# Patient Record
Sex: Female | Born: 1975 | Hispanic: Yes | Smoking: Never smoker
Health system: Southern US, Community
[De-identification: ages and names within clinical notes are randomized; demographics above are authoritative.]

## PROBLEM LIST (undated history)

## (undated) ENCOUNTER — Inpatient Hospital Stay (HOSPITAL_COMMUNITY): Payer: Self-pay

## (undated) DIAGNOSIS — F419 Anxiety disorder, unspecified: Secondary | ICD-10-CM

## (undated) DIAGNOSIS — O09529 Supervision of elderly multigravida, unspecified trimester: Secondary | ICD-10-CM

## (undated) DIAGNOSIS — D649 Anemia, unspecified: Secondary | ICD-10-CM

## (undated) DIAGNOSIS — I10 Essential (primary) hypertension: Secondary | ICD-10-CM

## (undated) DIAGNOSIS — E119 Type 2 diabetes mellitus without complications: Secondary | ICD-10-CM

## (undated) HISTORY — DX: Anemia, unspecified: D64.9

## (undated) HISTORY — DX: Anxiety disorder, unspecified: F41.9

## (undated) HISTORY — DX: Supervision of elderly multigravida, unspecified trimester: O09.529

## (undated) HISTORY — DX: Type 2 diabetes mellitus without complications: E11.9

---

## 1998-01-21 ENCOUNTER — Ambulatory Visit (HOSPITAL_COMMUNITY): Admission: RE | Admit: 1998-01-21 | Discharge: 1998-01-21 | Payer: Self-pay | Admitting: *Deleted

## 1998-03-27 ENCOUNTER — Observation Stay (HOSPITAL_COMMUNITY): Admission: EM | Admit: 1998-03-27 | Discharge: 1998-03-28 | Payer: Self-pay | Admitting: Emergency Medicine

## 1998-04-18 ENCOUNTER — Inpatient Hospital Stay (HOSPITAL_COMMUNITY): Admission: AD | Admit: 1998-04-18 | Discharge: 1998-04-18 | Payer: Self-pay | Admitting: *Deleted

## 1998-04-19 ENCOUNTER — Inpatient Hospital Stay (HOSPITAL_COMMUNITY): Admission: AD | Admit: 1998-04-19 | Discharge: 1998-04-19 | Payer: Self-pay | Admitting: *Deleted

## 1998-05-15 ENCOUNTER — Ambulatory Visit (HOSPITAL_COMMUNITY): Admission: RE | Admit: 1998-05-15 | Discharge: 1998-05-15 | Payer: Self-pay | Admitting: Obstetrics

## 1998-05-17 ENCOUNTER — Inpatient Hospital Stay (HOSPITAL_COMMUNITY): Admission: AD | Admit: 1998-05-17 | Discharge: 1998-05-17 | Payer: Self-pay | Admitting: *Deleted

## 1998-06-11 ENCOUNTER — Inpatient Hospital Stay (HOSPITAL_COMMUNITY): Admission: AD | Admit: 1998-06-11 | Discharge: 1998-06-11 | Payer: Self-pay | Admitting: Obstetrics

## 1998-06-12 ENCOUNTER — Inpatient Hospital Stay (HOSPITAL_COMMUNITY): Admission: RE | Admit: 1998-06-12 | Discharge: 1998-06-15 | Payer: Self-pay | Admitting: Obstetrics & Gynecology

## 1999-12-26 ENCOUNTER — Inpatient Hospital Stay (HOSPITAL_COMMUNITY): Admission: AD | Admit: 1999-12-26 | Discharge: 1999-12-26 | Payer: Self-pay | Admitting: *Deleted

## 2000-01-27 ENCOUNTER — Emergency Department (HOSPITAL_COMMUNITY): Admission: EM | Admit: 2000-01-27 | Discharge: 2000-01-28 | Payer: Self-pay | Admitting: Emergency Medicine

## 2000-01-27 ENCOUNTER — Inpatient Hospital Stay (HOSPITAL_COMMUNITY): Admission: AD | Admit: 2000-01-27 | Discharge: 2000-01-27 | Payer: Self-pay | Admitting: Obstetrics

## 2000-02-13 ENCOUNTER — Ambulatory Visit (HOSPITAL_COMMUNITY): Admission: RE | Admit: 2000-02-13 | Discharge: 2000-02-13 | Payer: Self-pay | Admitting: *Deleted

## 2000-02-13 ENCOUNTER — Inpatient Hospital Stay (HOSPITAL_COMMUNITY): Admission: AD | Admit: 2000-02-13 | Discharge: 2000-02-13 | Payer: Self-pay | Admitting: Obstetrics

## 2000-02-13 ENCOUNTER — Encounter: Payer: Self-pay | Admitting: *Deleted

## 2000-02-18 ENCOUNTER — Ambulatory Visit (HOSPITAL_COMMUNITY): Admission: RE | Admit: 2000-02-18 | Discharge: 2000-02-18 | Payer: Self-pay | Admitting: *Deleted

## 2000-02-18 ENCOUNTER — Encounter: Payer: Self-pay | Admitting: *Deleted

## 2000-08-26 ENCOUNTER — Ambulatory Visit (HOSPITAL_COMMUNITY): Admission: RE | Admit: 2000-08-26 | Discharge: 2000-08-26 | Payer: Self-pay | Admitting: *Deleted

## 2000-10-09 ENCOUNTER — Inpatient Hospital Stay (HOSPITAL_COMMUNITY): Admission: AD | Admit: 2000-10-09 | Discharge: 2000-10-09 | Payer: Self-pay | Admitting: *Deleted

## 2000-12-28 ENCOUNTER — Encounter: Payer: Self-pay | Admitting: Obstetrics

## 2000-12-28 ENCOUNTER — Inpatient Hospital Stay (HOSPITAL_COMMUNITY): Admission: AD | Admit: 2000-12-28 | Discharge: 2000-12-28 | Payer: Self-pay | Admitting: Obstetrics

## 2001-01-27 ENCOUNTER — Encounter (HOSPITAL_COMMUNITY): Admission: RE | Admit: 2001-01-27 | Discharge: 2001-02-03 | Payer: Self-pay | Admitting: *Deleted

## 2001-01-31 ENCOUNTER — Inpatient Hospital Stay (HOSPITAL_COMMUNITY): Admission: AD | Admit: 2001-01-31 | Discharge: 2001-01-31 | Payer: Self-pay | Admitting: *Deleted

## 2001-02-04 ENCOUNTER — Inpatient Hospital Stay (HOSPITAL_COMMUNITY): Admission: AD | Admit: 2001-02-04 | Discharge: 2001-02-05 | Payer: Self-pay | Admitting: *Deleted

## 2001-10-02 ENCOUNTER — Encounter: Payer: Self-pay | Admitting: Obstetrics

## 2001-10-02 ENCOUNTER — Inpatient Hospital Stay (HOSPITAL_COMMUNITY): Admission: AD | Admit: 2001-10-02 | Discharge: 2001-10-02 | Payer: Self-pay | Admitting: Obstetrics & Gynecology

## 2002-07-13 ENCOUNTER — Inpatient Hospital Stay (HOSPITAL_COMMUNITY): Admission: AD | Admit: 2002-07-13 | Discharge: 2002-07-13 | Payer: Self-pay | Admitting: *Deleted

## 2002-07-16 ENCOUNTER — Inpatient Hospital Stay (HOSPITAL_COMMUNITY): Admission: AD | Admit: 2002-07-16 | Discharge: 2002-07-16 | Payer: Self-pay | Admitting: Obstetrics and Gynecology

## 2002-07-16 ENCOUNTER — Encounter: Payer: Self-pay | Admitting: Obstetrics and Gynecology

## 2003-03-26 ENCOUNTER — Encounter: Admission: RE | Admit: 2003-03-26 | Discharge: 2003-03-26 | Payer: Self-pay | Admitting: Family Medicine

## 2003-04-02 ENCOUNTER — Encounter (INDEPENDENT_AMBULATORY_CARE_PROVIDER_SITE_OTHER): Payer: Self-pay | Admitting: *Deleted

## 2003-04-02 ENCOUNTER — Encounter: Admission: RE | Admit: 2003-04-02 | Discharge: 2003-04-02 | Payer: Self-pay | Admitting: Sports Medicine

## 2003-04-02 LAB — CONVERTED CEMR LAB

## 2003-04-05 ENCOUNTER — Ambulatory Visit (HOSPITAL_COMMUNITY): Admission: RE | Admit: 2003-04-05 | Discharge: 2003-04-05 | Payer: Self-pay | Admitting: Family Medicine

## 2003-04-15 ENCOUNTER — Inpatient Hospital Stay (HOSPITAL_COMMUNITY): Admission: AD | Admit: 2003-04-15 | Discharge: 2003-04-15 | Payer: Self-pay | Admitting: Family Medicine

## 2003-04-19 ENCOUNTER — Encounter: Admission: RE | Admit: 2003-04-19 | Discharge: 2003-04-19 | Payer: Self-pay | Admitting: Family Medicine

## 2003-05-22 ENCOUNTER — Encounter: Admission: RE | Admit: 2003-05-22 | Discharge: 2003-05-22 | Payer: Self-pay | Admitting: Family Medicine

## 2003-06-24 ENCOUNTER — Encounter: Admission: RE | Admit: 2003-06-24 | Discharge: 2003-06-24 | Payer: Self-pay | Admitting: Sports Medicine

## 2003-06-27 ENCOUNTER — Encounter: Admission: RE | Admit: 2003-06-27 | Discharge: 2003-06-27 | Payer: Self-pay | Admitting: Family Medicine

## 2003-07-11 ENCOUNTER — Encounter: Admission: RE | Admit: 2003-07-11 | Discharge: 2003-07-11 | Payer: Self-pay | Admitting: Sports Medicine

## 2003-08-06 ENCOUNTER — Encounter: Payer: Self-pay | Admitting: *Deleted

## 2003-08-06 ENCOUNTER — Inpatient Hospital Stay (HOSPITAL_COMMUNITY): Admission: AD | Admit: 2003-08-06 | Discharge: 2003-08-06 | Payer: Self-pay | Admitting: *Deleted

## 2003-08-29 ENCOUNTER — Inpatient Hospital Stay (HOSPITAL_COMMUNITY): Admission: AD | Admit: 2003-08-29 | Discharge: 2003-08-29 | Payer: Self-pay | Admitting: Obstetrics & Gynecology

## 2003-08-30 ENCOUNTER — Inpatient Hospital Stay (HOSPITAL_COMMUNITY): Admission: AD | Admit: 2003-08-30 | Discharge: 2003-08-31 | Payer: Self-pay | Admitting: Obstetrics & Gynecology

## 2006-12-30 ENCOUNTER — Encounter (INDEPENDENT_AMBULATORY_CARE_PROVIDER_SITE_OTHER): Payer: Self-pay | Admitting: *Deleted

## 2007-07-24 ENCOUNTER — Emergency Department (HOSPITAL_COMMUNITY): Admission: EM | Admit: 2007-07-24 | Discharge: 2007-07-24 | Payer: Self-pay | Admitting: Emergency Medicine

## 2007-08-23 ENCOUNTER — Inpatient Hospital Stay (HOSPITAL_COMMUNITY): Admission: AD | Admit: 2007-08-23 | Discharge: 2007-08-23 | Payer: Self-pay | Admitting: Family Medicine

## 2007-10-05 ENCOUNTER — Ambulatory Visit (HOSPITAL_COMMUNITY): Admission: RE | Admit: 2007-10-05 | Discharge: 2007-10-05 | Payer: Self-pay | Admitting: Family Medicine

## 2008-10-03 ENCOUNTER — Inpatient Hospital Stay (HOSPITAL_COMMUNITY): Admission: AD | Admit: 2008-10-03 | Discharge: 2008-10-03 | Payer: Self-pay | Admitting: Obstetrics & Gynecology

## 2010-02-11 ENCOUNTER — Inpatient Hospital Stay (HOSPITAL_COMMUNITY): Admission: AD | Admit: 2010-02-11 | Discharge: 2010-02-11 | Payer: Self-pay | Admitting: Obstetrics & Gynecology

## 2010-02-17 ENCOUNTER — Inpatient Hospital Stay (HOSPITAL_COMMUNITY): Admission: AD | Admit: 2010-02-17 | Discharge: 2010-02-18 | Payer: Self-pay | Admitting: Obstetrics & Gynecology

## 2010-02-26 ENCOUNTER — Ambulatory Visit (HOSPITAL_COMMUNITY): Admission: RE | Admit: 2010-02-26 | Discharge: 2010-02-26 | Payer: Self-pay | Admitting: Obstetrics & Gynecology

## 2010-03-19 ENCOUNTER — Ambulatory Visit (HOSPITAL_COMMUNITY): Admission: RE | Admit: 2010-03-19 | Discharge: 2010-03-19 | Payer: Self-pay | Admitting: Obstetrics & Gynecology

## 2010-04-02 ENCOUNTER — Encounter: Payer: Self-pay | Admitting: Obstetrics & Gynecology

## 2010-04-02 ENCOUNTER — Ambulatory Visit (HOSPITAL_COMMUNITY): Admission: RE | Admit: 2010-04-02 | Discharge: 2010-04-02 | Payer: Self-pay | Admitting: Obstetrics & Gynecology

## 2010-04-02 ENCOUNTER — Encounter (INDEPENDENT_AMBULATORY_CARE_PROVIDER_SITE_OTHER): Payer: Self-pay | Admitting: Family Medicine

## 2010-04-02 ENCOUNTER — Ambulatory Visit: Payer: Self-pay | Admitting: Obstetrics & Gynecology

## 2010-04-02 LAB — CONVERTED CEMR LAB
Basophils Relative: 0 % (ref 0–1)
Eosinophils Absolute: 0.1 10*3/uL (ref 0.0–0.7)
Eosinophils Relative: 2 % (ref 0–5)
HCT: 33.1 % — ABNORMAL LOW (ref 36.0–46.0)
Hgb F Quant: 0 % (ref 0.0–2.0)
Hgb S Quant: 0 % (ref 0.0–0.0)
Lymphs Abs: 1.5 10*3/uL (ref 0.7–4.0)
MCHC: 32.3 g/dL (ref 30.0–36.0)
MCV: 81.5 fL (ref 78.0–100.0)
Monocytes Absolute: 0.3 10*3/uL (ref 0.1–1.0)
Monocytes Relative: 6 % (ref 3–12)
RBC: 4.06 M/uL (ref 3.87–5.11)
Rh Type: POSITIVE
Rubella: 66.9 intl units/mL — ABNORMAL HIGH
WBC: 5.1 10*3/uL (ref 4.0–10.5)

## 2010-04-30 ENCOUNTER — Ambulatory Visit: Payer: Self-pay | Admitting: Obstetrics & Gynecology

## 2010-04-30 ENCOUNTER — Ambulatory Visit (HOSPITAL_COMMUNITY): Admission: RE | Admit: 2010-04-30 | Discharge: 2010-04-30 | Payer: Self-pay | Admitting: Obstetrics & Gynecology

## 2010-05-15 ENCOUNTER — Ambulatory Visit (HOSPITAL_COMMUNITY): Admission: RE | Admit: 2010-05-15 | Discharge: 2010-05-15 | Payer: Self-pay | Admitting: Obstetrics & Gynecology

## 2010-05-28 ENCOUNTER — Ambulatory Visit: Payer: Self-pay | Admitting: Obstetrics and Gynecology

## 2010-07-02 ENCOUNTER — Encounter: Payer: Self-pay | Admitting: Obstetrics & Gynecology

## 2010-07-02 ENCOUNTER — Ambulatory Visit: Payer: Self-pay | Admitting: Obstetrics & Gynecology

## 2010-07-02 LAB — CONVERTED CEMR LAB: Pap Smear: NEGATIVE

## 2010-07-16 ENCOUNTER — Encounter: Payer: Self-pay | Admitting: Obstetrics and Gynecology

## 2010-07-16 ENCOUNTER — Ambulatory Visit: Payer: Self-pay | Admitting: Family Medicine

## 2010-07-16 LAB — CONVERTED CEMR LAB
HCT: 33.3 % — ABNORMAL LOW (ref 36.0–46.0)
Hemoglobin: 10.7 g/dL — ABNORMAL LOW (ref 12.0–15.0)
Platelets: 241 10*3/uL (ref 150–400)

## 2010-07-30 ENCOUNTER — Ambulatory Visit: Payer: Self-pay | Admitting: Obstetrics and Gynecology

## 2010-07-30 ENCOUNTER — Encounter: Payer: Self-pay | Admitting: Family Medicine

## 2010-07-31 ENCOUNTER — Encounter: Payer: Self-pay | Admitting: Obstetrics & Gynecology

## 2010-08-20 ENCOUNTER — Encounter (INDEPENDENT_AMBULATORY_CARE_PROVIDER_SITE_OTHER): Payer: Self-pay | Admitting: *Deleted

## 2010-08-20 ENCOUNTER — Ambulatory Visit: Payer: Self-pay | Admitting: Obstetrics & Gynecology

## 2010-09-03 ENCOUNTER — Ambulatory Visit: Payer: Self-pay | Admitting: Family Medicine

## 2010-09-16 ENCOUNTER — Encounter: Payer: Self-pay | Admitting: Obstetrics and Gynecology

## 2010-09-16 ENCOUNTER — Ambulatory Visit: Payer: Self-pay | Admitting: Obstetrics and Gynecology

## 2010-09-17 ENCOUNTER — Encounter: Payer: Self-pay | Admitting: Obstetrics and Gynecology

## 2010-09-30 ENCOUNTER — Ambulatory Visit: Payer: Self-pay | Admitting: Obstetrics and Gynecology

## 2010-10-07 ENCOUNTER — Ambulatory Visit: Payer: Self-pay | Admitting: Obstetrics and Gynecology

## 2010-10-07 ENCOUNTER — Encounter: Payer: Self-pay | Admitting: Obstetrics and Gynecology

## 2010-10-14 ENCOUNTER — Ambulatory Visit: Payer: Self-pay | Admitting: Obstetrics and Gynecology

## 2010-10-14 ENCOUNTER — Inpatient Hospital Stay (HOSPITAL_COMMUNITY)
Admission: AD | Admit: 2010-10-14 | Discharge: 2010-10-16 | Payer: Self-pay | Source: Home / Self Care | Attending: Obstetrics & Gynecology | Admitting: Obstetrics & Gynecology

## 2010-10-14 ENCOUNTER — Encounter: Payer: Self-pay | Admitting: Obstetrics and Gynecology

## 2010-10-14 DIAGNOSIS — O139 Gestational [pregnancy-induced] hypertension without significant proteinuria, unspecified trimester: Secondary | ICD-10-CM

## 2010-10-21 ENCOUNTER — Ambulatory Visit: Payer: Self-pay | Admitting: Obstetrics and Gynecology

## 2010-11-27 ENCOUNTER — Ambulatory Visit: Admit: 2010-11-27 | Payer: Self-pay | Admitting: Obstetrics and Gynecology

## 2011-01-12 LAB — POCT URINALYSIS DIPSTICK
Bilirubin Urine: NEGATIVE
Bilirubin Urine: NEGATIVE
Glucose, UA: NEGATIVE mg/dL
Ketones, ur: NEGATIVE mg/dL
Nitrite: NEGATIVE
Nitrite: NEGATIVE
Protein, ur: NEGATIVE mg/dL
Protein, ur: NEGATIVE mg/dL
Specific Gravity, Urine: 1.02 (ref 1.005–1.030)
Urobilinogen, UA: 0.2 mg/dL (ref 0.0–1.0)
pH: 6.5 (ref 5.0–8.0)
pH: 7 (ref 5.0–8.0)

## 2011-01-12 LAB — PROTEIN / CREATININE RATIO, URINE
Creatinine, Urine: 74.8 mg/dL
Protein Creatinine Ratio: 0.19 — ABNORMAL HIGH (ref 0.00–0.15)
Total Protein, Urine: 14 mg/dL

## 2011-01-12 LAB — COMPREHENSIVE METABOLIC PANEL
CO2: 22 mEq/L (ref 19–32)
Calcium: 8.7 mg/dL (ref 8.4–10.5)
Creatinine, Ser: 0.58 mg/dL (ref 0.4–1.2)
GFR calc Af Amer: >60 mL/min (ref >60)
GFR calc non Af Amer: >60 mL/min (ref >60)
Glucose, Bld: 82 mg/dL (ref 70–99)

## 2011-01-12 LAB — CBC
HCT: 30.5 % — ABNORMAL LOW (ref 36.0–46.0)
Hemoglobin: 10.2 g/dL — ABNORMAL LOW (ref 12.0–15.0)
MCH: 26.5 pg (ref 26.0–34.0)
MCHC: 33.5 g/dL (ref 30.0–36.0)
Platelets: 162 10*3/uL (ref 150–400)
RDW: 14.7 % (ref 11.5–15.5)
WBC: 4.8 10*3/uL (ref 4.0–10.5)

## 2011-01-13 LAB — POCT URINALYSIS DIPSTICK
Bilirubin Urine: NEGATIVE
Bilirubin Urine: NEGATIVE
Bilirubin Urine: NEGATIVE
Bilirubin Urine: NEGATIVE
Glucose, UA: NEGATIVE mg/dL
Glucose, UA: NEGATIVE mg/dL
Glucose, UA: NEGATIVE mg/dL
Glucose, UA: NEGATIVE mg/dL
Ketones, ur: NEGATIVE mg/dL
Ketones, ur: NEGATIVE mg/dL
Ketones, ur: NEGATIVE mg/dL
Ketones, ur: NEGATIVE mg/dL
Nitrite: NEGATIVE
Nitrite: NEGATIVE
Protein, ur: NEGATIVE mg/dL
Specific Gravity, Urine: 1.025 (ref 1.005–1.030)
Specific Gravity, Urine: 1.025 (ref 1.005–1.030)
Urobilinogen, UA: 0.2 mg/dL (ref 0.0–1.0)
pH: 5.5 (ref 5.0–8.0)
pH: 5.5 (ref 5.0–8.0)

## 2011-01-14 LAB — POCT URINALYSIS DIPSTICK
Bilirubin Urine: NEGATIVE
Bilirubin Urine: NEGATIVE
Bilirubin Urine: NEGATIVE
Glucose, UA: NEGATIVE mg/dL
Glucose, UA: NEGATIVE mg/dL
Glucose, UA: NEGATIVE mg/dL
Ketones, ur: NEGATIVE mg/dL
Ketones, ur: NEGATIVE mg/dL
Nitrite: NEGATIVE
Nitrite: POSITIVE — AB
Protein, ur: NEGATIVE mg/dL
Protein, ur: NEGATIVE mg/dL
Specific Gravity, Urine: 1.02 (ref 1.005–1.030)
Specific Gravity, Urine: 1.025 (ref 1.005–1.030)
Specific Gravity, Urine: 1.025 (ref 1.005–1.030)
Urobilinogen, UA: 0.2 mg/dL (ref 0.0–1.0)
Urobilinogen, UA: 0.2 mg/dL (ref 0.0–1.0)
pH: 5.5 (ref 5.0–8.0)
pH: 6.5 (ref 5.0–8.0)
pH: 5.5 (ref 5.0–8.0)

## 2011-01-16 LAB — POCT URINALYSIS DIP (DEVICE)
Bilirubin Urine: NEGATIVE
Ketones, ur: NEGATIVE mg/dL
Protein, ur: NEGATIVE mg/dL
pH: 5.5 (ref 5.0–8.0)

## 2011-01-18 LAB — POCT URINALYSIS DIP (DEVICE)
Ketones, ur: NEGATIVE mg/dL
Protein, ur: NEGATIVE mg/dL
Protein, ur: NEGATIVE mg/dL
Specific Gravity, Urine: 1.025 (ref 1.005–1.030)
Specific Gravity, Urine: >=1.03 (ref 1.005–1.030)
Urobilinogen, UA: 0.2 mg/dL (ref 0.0–1.0)
pH: 5.5 (ref 5.0–8.0)
pH: 6.5 (ref 5.0–8.0)

## 2011-01-19 LAB — URINALYSIS, ROUTINE W REFLEX MICROSCOPIC
Bilirubin Urine: NEGATIVE
Leukocytes, UA: NEGATIVE
Nitrite: NEGATIVE
Specific Gravity, Urine: >1.03 — ABNORMAL HIGH (ref 1.005–1.030)
Urobilinogen, UA: 0.2 mg/dL (ref 0.0–1.0)

## 2011-01-19 LAB — URINE MICROSCOPIC-ADD ON

## 2011-01-20 LAB — CBC
Hemoglobin: 11.6 g/dL — ABNORMAL LOW (ref 12.0–15.0)
MCHC: 33.5 g/dL (ref 30.0–36.0)
MCV: 82.1 fL (ref 78.0–100.0)
RBC: 4.23 MIL/uL (ref 3.87–5.11)

## 2011-01-20 LAB — URINE MICROSCOPIC-ADD ON

## 2011-01-20 LAB — URINALYSIS, ROUTINE W REFLEX MICROSCOPIC
Glucose, UA: NEGATIVE mg/dL
Ketones, ur: 15 mg/dL — AB
Leukocytes, UA: NEGATIVE
pH: 6.5 (ref 5.0–8.0)

## 2011-01-20 LAB — URINE CULTURE: Colony Count: >=100000

## 2011-01-20 LAB — GC/CHLAMYDIA PROBE AMP, GENITAL: GC Probe Amp, Genital: NEGATIVE

## 2011-08-06 LAB — HCG, SERUM, QUALITATIVE: Preg, Serum: NEGATIVE

## 2011-08-06 LAB — WET PREP, GENITAL: Yeast Wet Prep HPF POC: NONE SEEN

## 2011-08-06 LAB — CBC
Platelets: 291 10*3/uL (ref 150–400)
WBC: 6.7 10*3/uL (ref 4.0–10.5)

## 2011-08-06 LAB — GC/CHLAMYDIA PROBE AMP, GENITAL: Chlamydia, DNA Probe: NEGATIVE

## 2011-08-11 LAB — POCT PREGNANCY, URINE
Operator id: 239321
Preg Test, Ur: NEGATIVE

## 2011-08-11 LAB — DIFFERENTIAL
Eosinophils Absolute: 0.1
Eosinophils Relative: 1
Lymphs Abs: 1.8
Monocytes Absolute: 0.4
Monocytes Relative: 6

## 2011-08-11 LAB — CBC
HCT: 38.1
Hemoglobin: 13.1
MCV: 80.1
Platelets: 306
RBC: 4.76
WBC: 6.7

## 2011-08-11 LAB — URINALYSIS, ROUTINE W REFLEX MICROSCOPIC
Bilirubin Urine: NEGATIVE
Glucose, UA: NEGATIVE
Ketones, ur: NEGATIVE
Leukocytes, UA: NEGATIVE
pH: 6.5

## 2011-08-11 LAB — WET PREP, GENITAL: Yeast Wet Prep HPF POC: NONE SEEN

## 2011-08-12 LAB — I-STAT 8, (EC8 V) (CONVERTED LAB)
Acid-Base Excess: 1
Chloride: 106
HCT: 46
Hemoglobin: 15.6 — ABNORMAL HIGH
Operator id: 151321
Potassium: 4.1
Sodium: 138
TCO2: 28

## 2011-08-12 LAB — POCT CARDIAC MARKERS: Troponin i, poc: 0.11 — ABNORMAL HIGH

## 2011-08-12 LAB — POCT I-STAT CREATININE: Operator id: 151321

## 2012-08-11 ENCOUNTER — Emergency Department (HOSPITAL_COMMUNITY): Payer: Self-pay

## 2012-08-11 ENCOUNTER — Emergency Department (HOSPITAL_COMMUNITY)
Admission: EM | Admit: 2012-08-11 | Discharge: 2012-08-11 | Disposition: A | Discharge status: Home / Self Care | Payer: Self-pay | Attending: Emergency Medicine | Admitting: Emergency Medicine

## 2012-08-11 ENCOUNTER — Encounter (HOSPITAL_COMMUNITY): Payer: Self-pay | Admitting: *Deleted

## 2012-08-11 DIAGNOSIS — S79919A Unspecified injury of unspecified hip, initial encounter: Secondary | ICD-10-CM | POA: Insufficient documentation

## 2012-08-11 DIAGNOSIS — W19XXXA Unspecified fall, initial encounter: Secondary | ICD-10-CM | POA: Insufficient documentation

## 2012-08-11 DIAGNOSIS — T148XXA Other injury of unspecified body region, initial encounter: Secondary | ICD-10-CM

## 2012-08-11 MED ORDER — ACETAMINOPHEN 325 MG PO TABS
650.0000 mg | ORAL_TABLET | Freq: Once | ORAL | Status: AC
  Administered 2012-08-11: 650 mg via ORAL
  Filled 2012-08-11: qty 2

## 2012-08-11 MED ORDER — HYDROCODONE-ACETAMINOPHEN 5-325 MG PO TABS: 1.0000 | ORAL_TABLET | ORAL | Status: DC | PRN

## 2012-08-11 NOTE — ED Provider Notes (Signed)
Kathleen Tucker S 8:00 PM patient discussed in sign out with Dr. Effie Shy. Patient with a fall and minor trauma. X-rays of lumbar and sacral spine pending. We'll disposition patient based on x-ray findings.    Extremities without signs of fractures or other concerning injury. At this time will have patient return home with conservative treatment.  Angus Seller, [place] 08/11/12 2103

## 2012-08-11 NOTE — ED Provider Notes (Signed)
History   This chart was scribed for No att. providers found by Toya Smothers. The patient was seen in room TR08C/TR08C. Patient's care was started at 1755.  CSN: [id number]  Arrival date & time 08/11/12  1755   First MD Initiated Contact with Patient 08/11/12 1934      Chief Complaint  Patient presents with  . Fall   The history is provided by the patient. No language interpreter was used.   Kathleen Tucker is a 36 y.o. female who presents to the Emergency Department complaining of 1 day of sudden onset severe constant lower back pain with associate bruising as the result of a 2 foot fall. Pain is aggravated with ambulation and palpation, while alleviated by nothing. Pt denotes landing on her coccyx area falling down 4 stairs onto a hard wood surface. She was ambulatory after fall, not entrapped, and without cephalic trauma. Prior to arrival pain has been treated with Tylenol providing mild relief. Pt denies cephalic pain, neck pain, chest pain, chest tightness, SOB, nausea, and emesis.   History reviewed. No pertinent past medical history.  History reviewed. No pertinent past surgical history.  No family history on file.  History  Substance Use Topics  . Smoking status: Never Smoker   . Smokeless tobacco: Not on file  . Alcohol Use: No   Review of Systems  Constitutional: Negative for fatigue.  HENT: Negative for congestion, sinus pressure and ear discharge.   Eyes: Negative for discharge.  Respiratory: Negative for cough.   Cardiovascular: Negative for chest pain.  Gastrointestinal: Negative for diarrhea, blood in stool and anal bleeding.  Genitourinary: Negative for frequency.  Musculoskeletal: Positive for back pain.  Skin: Negative for rash.  Neurological: Negative for seizures.  Hematological: Negative.   Psychiatric/Behavioral: Negative for hallucinations.  All other systems reviewed and are negative.    Allergies  Review of patient's allergies indicates no  known allergies.  Home Medications   Current Outpatient Rx  Name  Route  Sig  Dispense  Refill  . ACETAMINOPHEN 500 MG PO TABS   Oral   Take 500 mg by mouth every 6 (six) hours as needed. For pain         . HYDROCODONE-ACETAMINOPHEN 5-325 MG PO TABS   Oral   Take 1 tablet by mouth every 4 (four) hours as needed for pain.   10 tablet   0     BP 130/70  Pulse 74  Temp 98.3 F (36.8 C) (Oral)  Resp 18  SpO2 98%  LMP 08/10/2012  Physical Exam  Nursing note and vitals reviewed. Constitutional: She is oriented to person, place, and time. She appears well-developed and well-nourished. No distress.        She ambulates with right antalgic gate.  HENT:  Head: Normocephalic and atraumatic.  Eyes: Conjunctivae normal and EOM are normal.  Neck: Neck supple. No tracheal deviation present.  Cardiovascular: Normal rate.   Pulmonary/Chest: Effort normal. No respiratory distress.  Abdominal: She exhibits no distension.  Musculoskeletal: Normal range of motion.       Diffuse thoracic and lumbar tenderness, mild. Right buttocks hematoma 15 x 10 cm.  Neurological: She is alert and oriented to person, place, and time. No sensory deficit.  Skin: Skin is dry.  Psychiatric: She has a normal mood and affect. Her behavior is normal.    ED Course  Procedures  DIAGNOSTIC STUDIES: Oxygen Saturation is 98% on room air, normal by my interpretation.    COORDINATION OF CARE: 19:39-  Evaluated Pt. Pt is awake, alert, and oriented. 19:42- Patientinformed of clinical course, understand medical decision-making process, and agree with plan. Plan: Home Medications- Percocet; Home Treatments- cold compress;  19:45- Ordered DG Lumbar Spine Complete 1 time imaging and DG Pelvis 1-2 Views 1 time imaging.       1. Fall   2. Contusion       MDM  Apparent mechanical fall without severe injury. Pt is stable for d/c.    I personally performed the services described in this documentation, which  was scribed in my presence. The recorded information has been reviewed and considered.    Flint Melter, MD 09/04/12 1204

## 2012-08-11 NOTE — ED Provider Notes (Signed)
Medical screening examination/treatment/procedure(s) were performed by non-physician practitioner and as supervising physician I was immediately available for consultation/collaboration.   Jacklin Zwick, MD 08/11/12 2355 

## 2012-08-11 NOTE — ED Notes (Signed)
C/o pain in her rt hip where she fell yesterday

## 2012-10-16 ENCOUNTER — Encounter (HOSPITAL_COMMUNITY): Payer: Self-pay | Admitting: *Deleted

## 2012-10-16 ENCOUNTER — Emergency Department (HOSPITAL_COMMUNITY)
Admission: EM | Admit: 2012-10-16 | Discharge: 2012-10-16 | Disposition: A | Discharge status: Home / Self Care | Payer: Self-pay | Attending: Emergency Medicine | Admitting: Emergency Medicine

## 2012-10-16 DIAGNOSIS — IMO0002 Reserved for concepts with insufficient information to code with codable children: Secondary | ICD-10-CM | POA: Insufficient documentation

## 2012-10-16 DIAGNOSIS — Y929 Unspecified place or not applicable: Secondary | ICD-10-CM | POA: Insufficient documentation

## 2012-10-16 DIAGNOSIS — T169XXA Foreign body in ear, unspecified ear, initial encounter: Secondary | ICD-10-CM | POA: Insufficient documentation

## 2012-10-16 DIAGNOSIS — R51 Headache: Secondary | ICD-10-CM | POA: Insufficient documentation

## 2012-10-16 DIAGNOSIS — Z791 Long term (current) use of non-steroidal anti-inflammatories (NSAID): Secondary | ICD-10-CM | POA: Insufficient documentation

## 2012-10-16 DIAGNOSIS — Y939 Activity, unspecified: Secondary | ICD-10-CM | POA: Insufficient documentation

## 2012-10-16 MED ORDER — IBUPROFEN 600 MG PO TABS: 600.0000 mg | ORAL_TABLET | Freq: Four times a day (QID) | ORAL | Status: DC | PRN

## 2012-10-16 NOTE — ED Notes (Signed)
Pt complains of left ear irritation after a bug flew in her ear yesterday.

## 2012-10-16 NOTE — ED Notes (Signed)
Pt is here with left ear pain since yesterday

## 2012-10-16 NOTE — ED Provider Notes (Signed)
Medical screening examination/treatment/procedure(s) were performed by non-physician practitioner and as supervising physician I was immediately available for consultation/collaboration.  Glynn Octave, MD 10/16/12 [phone]

## 2012-10-16 NOTE — ED Provider Notes (Signed)
History     CSN: [id number]  Arrival date & time 10/16/12  1240   First MD Initiated Contact with Patient 10/16/12 1513      Chief Complaint  Patient presents with  . Otalgia    (Consider location/radiation/quality/duration/timing/severity/associated sxs/prior treatment) Patient is a 36 y.o. female presenting with ear pain. The history is provided by the patient and a relative.  Otalgia This is a new problem. The current episode started 12 to 24 hours ago. There is pain in the left (patient states that an insect crawled into her ear yesterday.) ear. There has been no fever. The pain is at a severity of 6/10. The pain is moderate. Associated symptoms include headaches. Pertinent negatives include no ear discharge, no rhinorrhea, no neck pain and no cough.    History reviewed. No pertinent past medical history.  History reviewed. No pertinent past surgical history.  No family history on file.  History  Substance Use Topics  . Smoking status: Never Smoker   . Smokeless tobacco: Not on file  . Alcohol Use: No    OB History    Grav Para Term Preterm Abortions TAB SAB Ect Mult Living                  Review of Systems  HENT: Positive for ear pain. Negative for rhinorrhea, neck pain and ear discharge.   Respiratory: Negative for cough.   Neurological: Positive for headaches.    Allergies  Review of patient's allergies indicates no known allergies.  Home Medications   Current Outpatient Rx  Name  Route  Sig  Dispense  Refill  . ACETAMINOPHEN 500 MG PO TABS   Oral   Take 500 mg by mouth every 6 (six) hours as needed. For pain         . HYDROCODONE-ACETAMINOPHEN 5-325 MG PO TABS   Oral   Take 1 tablet by mouth every 4 (four) hours as needed. For pain           BP 127/65  Pulse 69  Temp 97.7 F (36.5 C) (Oral)  Resp 18  SpO2 97%  Physical Exam  HENT:  Left Ear: A foreign body is present.  Mouth/Throat: No posterior oropharyngeal edema or posterior  oropharyngeal erythema.    ED Course  FOREIGN BODY REMOVAL Date/Time: 10/16/2012 4:16 PM Performed by: Chauncey Reading Authorized by: Chauncey Reading Consent: Verbal consent obtained. Consent given by: patient Patient identity confirmed: verbally with patient Body area: ear Location details: left ear Patient sedated: no Patient restrained: no Patient cooperative: yes Localization method: visualized Removal mechanism: alligator forceps 0 objects recovered. Post-procedure assessment: foreign body not removed Comments: Several attempts without success of foreign body removal.  Will refer to ENT.   (including critical care time) Saline lavage also unsuccessful.  Labs Reviewed - No data to display No results found.   No diagnosis found. 1.  Foreign body in left ear canal    MDM  After several attempts, no success in foreign body removal in left ear. Referred to ENT.        Maela Takeda, PA-C 10/16/12 1625

## 2013-08-22 ENCOUNTER — Emergency Department (HOSPITAL_COMMUNITY)
Admission: EM | Admit: 2013-08-22 | Discharge: 2013-08-22 | Disposition: A | Discharge status: Home / Self Care | Payer: Self-pay | Attending: Emergency Medicine | Admitting: Emergency Medicine

## 2013-08-22 ENCOUNTER — Encounter (HOSPITAL_COMMUNITY): Payer: Self-pay | Admitting: Emergency Medicine

## 2013-08-22 DIAGNOSIS — J029 Acute pharyngitis, unspecified: Secondary | ICD-10-CM | POA: Insufficient documentation

## 2013-08-22 DIAGNOSIS — R509 Fever, unspecified: Secondary | ICD-10-CM | POA: Insufficient documentation

## 2013-08-22 MED ORDER — DEXAMETHASONE SODIUM PHOSPHATE 10 MG/ML IJ SOLN
10.0000 mg | Freq: Once | INTRAMUSCULAR | Status: AC
  Administered 2013-08-22: 10 mg via INTRAMUSCULAR
  Filled 2013-08-22: qty 1

## 2013-08-22 MED ORDER — OXYCODONE HCL 5 MG/5ML PO SOLN: 5.0000 mg | ORAL | Status: DC | PRN

## 2013-08-22 MED ORDER — OXYMETAZOLINE HCL 0.05 % NA SOLN
1.0000 | Freq: Once | NASAL | Status: AC
  Administered 2013-08-22: 1 via NASAL
  Filled 2013-08-22: qty 15

## 2013-08-22 NOTE — ED Notes (Signed)
PA at bedside.

## 2013-08-22 NOTE — ED Notes (Signed)
C/o l sided ear ache x 1 week, now radiating into L side of head. A&Ox4, resp e/u

## 2013-08-22 NOTE — ED Notes (Signed)
Left ear pain, left facial pain x 1 week.

## 2013-08-22 NOTE — ED Provider Notes (Signed)
CSN: [id number]     Arrival date & time 08/22/13  1752 History  This chart was scribed for Dierdre Forth, PA-C, working with Junius Argyle, MD by Blanchard Kelch, ED Scribe. This patient was seen in room TR05C/TR05C and the patient's care was started at 7:38 PM.     Chief Complaint  Patient presents with  . Otalgia    Patient is a 37 y.o. female presenting with ear pain. The history is provided by the patient and medical records. No language interpreter was used.  Otalgia Associated symptoms: fever and sore throat   Associated symptoms: no congestion, no cough, no rash and no vomiting     HPI Comments: Kathleen Tucker is a 37 y.o. female who presents to the Emergency Department complaining of constant left ear pain that began about a week ago. The pain radiates to the left side of her face. It is worsened with chewing. She had an associated fever yesterday with a max of 100.2 (per oral themometer) and sore throat. She denies congestion, nausea or vomiting. Nothing makes the pain better.  She denies feeling of throat closing or difficulty swallowing   History reviewed. No pertinent past medical history. History reviewed. No pertinent past surgical history. History reviewed. No pertinent family history. History  Substance Use Topics  . Smoking status: Never Smoker   . Smokeless tobacco: Not on file  . Alcohol Use: No   OB History   Grav Para Term Preterm Abortions TAB SAB Ect Mult Living                 Review of Systems  Constitutional: Positive for fever.  HENT: Positive for ear pain and sore throat. Negative for congestion.   Eyes: Negative for pain.  Respiratory: Negative for cough.   Cardiovascular: Negative for chest pain.  Gastrointestinal: Negative for nausea and vomiting.  Skin: Negative for rash.  Neurological: Negative for speech difficulty.  Hematological: Negative for adenopathy.  Psychiatric/Behavioral: Negative for confusion.  All other systems  reviewed and are negative.    Allergies  Review of patient's allergies indicates no known allergies.  Home Medications   Current Outpatient Rx  Name  Route  Sig  Dispense  Refill  . ibuprofen (ADVIL,MOTRIN) 200 MG tablet   Oral   Take 400 mg by mouth daily as needed for pain.         Marland Kitchen oxyCODONE (ROXICODONE) 5 MG/5ML solution   Oral   Take 5 mLs (5 mg total) by mouth every 4 (four) hours as needed for pain.   25 mL   0    Triage Vitals: BP 136/78  Pulse 67  Temp(Src) 98.1 F (36.7 C) (Oral)  Resp 16  SpO2 99%  Physical Exam  Nursing note and vitals reviewed. Constitutional: She is oriented to person, place, and time. She appears well-developed and well-nourished. No distress.  Awake, alert, nontoxic appearance  HENT:  Head: Normocephalic and atraumatic.  Right Ear: Tympanic membrane, external ear and ear canal normal. Tympanic membrane is not injected and not bulging. No middle ear effusion.  Left Ear: Tympanic membrane, external ear and ear canal normal. Tympanic membrane is not injected and not bulging.  No middle ear effusion.  Nose: Nose normal. No mucosal edema or rhinorrhea. Right sinus exhibits no maxillary sinus tenderness and no frontal sinus tenderness. Left sinus exhibits no maxillary sinus tenderness and no frontal sinus tenderness.  Mouth/Throat: Uvula is midline and mucous membranes are normal. No oral lesions. No uvula swelling  or lacerations. Posterior oropharyngeal edema and posterior oropharyngeal erythema present. No oropharyngeal exudate or tonsillar abscesses.  Open crypts bilaterally. No cervical or tonsillar adenopathy. No exudate.   Eyes: Conjunctivae are normal. Pupils are equal, round, and reactive to light. Right eye exhibits no discharge. Left eye exhibits no discharge. No scleral icterus.  Neck: Normal range of motion. Neck supple.  Patent airway, no stridor, handling secretions without difficulty No nuchal rigidity  Cardiovascular: Normal  rate, regular rhythm, normal heart sounds and intact distal pulses.   No murmur heard. Pulmonary/Chest: Effort normal and breath sounds normal. No respiratory distress. She has no wheezes.  Abdominal: Soft. Bowel sounds are normal. She exhibits no distension. There is no tenderness.  Musculoskeletal: Normal range of motion. She exhibits no edema.  Lymphadenopathy:    She has no cervical adenopathy.  Neurological: She is alert and oriented to person, place, and time. No cranial nerve deficit. She exhibits normal muscle tone. Coordination normal.  Speech is clear and goal oriented Moves extremities without ataxia Cranial nerves II through XII grossly intact  Skin: Skin is warm and dry. She is not diaphoretic.  Psychiatric: She has a normal mood and affect. Her behavior is normal.    ED Course  Procedures (including critical care time)  DIAGNOSTIC STUDIES: Oxygen Saturation is 99% on room air, normal by my interpretation.    COORDINATION OF CARE: 7:44 PM -Clinical suspicion of viral pharyngitis. Patient verbalizes understanding and agrees with treatment plan.    Labs Review Labs Reviewed - No data to display Imaging Review No results found.  EKG Interpretation   None       MDM   1. Acute viral pharyngitis      Kathleen Tucker presents with sore throat and ear pain.  Pt afebrile without tonsillar exudate. Presents without cervical lymphadenopathy, & only mild dysphagia; diagnosis of viral pharyngitis. No abx indicated. DC w symptomatic tx for pain  Pt does not appear dehydrated, but did discuss importance of water rehydration. Presentation non concerning for PTA or infxn spread to soft tissue. No trismus or uvula deviation. Specific return precautions discussed. Pt able to drink water in ED without difficulty with intact air way. Recommended PCP follow up.  It has been determined that no acute conditions requiring further emergency intervention are present at this time. The  patient/guardian have been advised of the diagnosis and plan. We have discussed signs and symptoms that warrant return to the ED, such as changes or worsening in symptoms.   Vital signs are stable at discharge.   BP 136/78  Pulse 67  Temp(Src) 98.1 F (36.7 C) (Oral)  Resp 16  SpO2 99%  Patient/guardian has voiced understanding and agreed to follow-up with the PCP or specialist.    I personally performed the services described in this documentation, which was scribed in my presence. The recorded information has been reviewed and is accurate.    Dahlia Client Yula Crotwell, PA-C 08/22/13 2013

## 2013-08-23 NOTE — ED Provider Notes (Signed)
Medical screening examination/treatment/procedure(s) were performed by non-physician practitioner and as supervising physician I was immediately available for consultation/collaboration.    Junius Argyle, MD 08/23/13 1154

## 2014-07-17 ENCOUNTER — Inpatient Hospital Stay (HOSPITAL_COMMUNITY)
Admission: AD | Admit: 2014-07-17 | Discharge: 2014-07-17 | Disposition: A | Discharge status: Home / Self Care | Payer: Self-pay | Source: Ambulatory Visit | Attending: Obstetrics & Gynecology | Admitting: Obstetrics & Gynecology

## 2014-07-17 ENCOUNTER — Inpatient Hospital Stay (HOSPITAL_COMMUNITY): Payer: Self-pay

## 2014-07-17 ENCOUNTER — Encounter (HOSPITAL_COMMUNITY): Payer: Self-pay | Admitting: *Deleted

## 2014-07-17 DIAGNOSIS — O468X1 Other antepartum hemorrhage, first trimester: Secondary | ICD-10-CM

## 2014-07-17 DIAGNOSIS — O4690 Antepartum hemorrhage, unspecified, unspecified trimester: Secondary | ICD-10-CM

## 2014-07-17 DIAGNOSIS — O208 Other hemorrhage in early pregnancy: Secondary | ICD-10-CM | POA: Insufficient documentation

## 2014-07-17 DIAGNOSIS — O418X10 Other specified disorders of amniotic fluid and membranes, first trimester, not applicable or unspecified: Secondary | ICD-10-CM

## 2014-07-17 DIAGNOSIS — O209 Hemorrhage in early pregnancy, unspecified: Secondary | ICD-10-CM

## 2014-07-17 LAB — POCT PREGNANCY, URINE: Preg Test, Ur: POSITIVE — AB

## 2014-07-17 LAB — WET PREP, GENITAL
Clue Cells Wet Prep HPF POC: NONE SEEN
Trich, Wet Prep: NONE SEEN
Yeast Wet Prep HPF POC: NONE SEEN

## 2014-07-17 LAB — URINALYSIS, ROUTINE W REFLEX MICROSCOPIC
Bilirubin Urine: NEGATIVE
Glucose, UA: NEGATIVE mg/dL
Ketones, ur: NEGATIVE mg/dL
LEUKOCYTES UA: NEGATIVE
NITRITE: NEGATIVE
Protein, ur: NEGATIVE mg/dL
Specific Gravity, Urine: 1.02 (ref 1.005–1.030)
UROBILINOGEN UA: 0.2 mg/dL (ref 0.0–1.0)
pH: 6 (ref 5.0–8.0)

## 2014-07-17 LAB — CBC
HEMATOCRIT: 35.1 % — AB (ref 36.0–46.0)
Hemoglobin: 11.8 g/dL — ABNORMAL LOW (ref 12.0–15.0)
MCH: 27.2 pg (ref 26.0–34.0)
MCHC: 33.6 g/dL (ref 30.0–36.0)
MCV: 80.9 fL (ref 78.0–100.0)
Platelets: 310 10*3/uL (ref 150–400)
RBC: 4.34 MIL/uL (ref 3.87–5.11)
RDW: 13.7 % (ref 11.5–15.5)
WBC: 7.1 10*3/uL (ref 4.0–10.5)

## 2014-07-17 LAB — URINE MICROSCOPIC-ADD ON

## 2014-07-17 LAB — ABO/RH: ABO/RH(D): B POS

## 2014-07-17 LAB — HCG, QUANTITATIVE, PREGNANCY: hCG, Beta Chain, Quant, S: 77465 m[IU]/mL — ABNORMAL HIGH (ref <5)

## 2014-07-17 NOTE — MAU Provider Note (Signed)
History     CSN: [id number]  Arrival date and time: 07/17/14 1606   None     Chief Complaint  Patient presents with  . Abdominal Pain  . Vaginal Discharge   HPI  [name] is a 38 y.o. female [id number] at [redacted]w[redacted]d who presents with abdominal pain and brown vaginal discharge. Her abdominal pain is on left side and it is described as "achy".   RN note:  Pt states that she has had abd pain for several weeks. dx with a UTI and +UPT on 8/26. Med was given for UTI, pt states she completed it fully. Also c/o pelvic pressure and dark brown discharge since yesterday. C/o weakness and achy all over but more so on the left side since Sunday.   OB History   Grav Para Term Preterm Abortions TAB SAB Ect Mult Living   No past medical history on file.  History reviewed. No pertinent past surgical history.  History reviewed. No pertinent family history.  History  Substance Use Topics  . Smoking status: Never Smoker   . Smokeless tobacco: Not on file  . Alcohol Use: No    Allergies: No Known Allergies  Prescriptions prior to admission  Medication Sig Dispense Refill  . metroNIDAZOLE (FLAGYL) 500 MG tablet Take 500 mg by mouth 2 (two) times daily.      . Prenatal Vit-Fe Fumarate-FA (PRENATAL MULTIVITAMIN) TABS tablet Take 1 tablet by mouth daily at 12 noon.      . [DISCONTINUED] ibuprofen (ADVIL,MOTRIN) 200 MG tablet Take 400 mg by mouth daily as needed for pain.      . [DISCONTINUED] oxyCODONE (ROXICODONE) 5 MG/5ML solution Take 5 mLs (5 mg total) by mouth every 4 (four) hours as needed for pain.  25 mL  0   Results for orders placed during the hospital encounter of 07/17/14 (from the past 48 hour(s))  URINALYSIS, ROUTINE W REFLEX MICROSCOPIC     Status: Abnormal   Collection Time    07/17/14  4:20 PM      Result Value Ref Range   Color, Urine YELLOW  YELLOW   APPearance CLEAR  CLEAR   Specific Gravity, Urine 1.020  1.005 - 1.030   pH 6.0  5.0 - 8.0    Glucose, UA NEGATIVE  NEGATIVE mg/dL   Hgb urine dipstick MODERATE (*) NEGATIVE   Bilirubin Urine NEGATIVE  NEGATIVE   Ketones, ur NEGATIVE  NEGATIVE mg/dL   Protein, ur NEGATIVE  NEGATIVE mg/dL   Urobilinogen, UA 0.2  0.0 - 1.0 mg/dL   Nitrite NEGATIVE  NEGATIVE   Leukocytes, UA NEGATIVE  NEGATIVE  URINE MICROSCOPIC-ADD ON     Status: None   Collection Time    07/17/14  4:20 PM      Result Value Ref Range   Squamous Epithelial / LPF RARE  RARE   WBC, UA 0-2  <3 WBC/hpf   RBC / HPF 3-6  <3 RBC/hpf   Bacteria, UA RARE  RARE  POCT PREGNANCY, URINE     Status: Abnormal   Collection Time    07/17/14  4:25 PM      Result Value Ref Range   Preg Test, Ur POSITIVE (*) NEGATIVE   Comment:            THE SENSITIVITY OF THIS     METHODOLOGY IS >24 mIU/mL  CBC     Status: Abnormal  Collection Time    07/17/14  5:15 PM      Result Value Ref Range   WBC 7.1  4.0 - 10.5 K/uL   RBC 4.34  3.87 - 5.11 MIL/uL   Hemoglobin 11.8 (*) 12.0 - 15.0 g/dL   HCT 45.4 (*) 09.8 - 11.9 %   MCV 80.9  78.0 - 100.0 fL   MCH 27.2  26.0 - 34.0 pg   MCHC 33.6  30.0 - 36.0 g/dL   RDW 14.7  82.9 - 56.2 %   Platelets 310  150 - 400 K/uL  ABO/RH     Status: None   Collection Time    07/17/14  5:15 PM      Result Value Ref Range   ABO/RH(D) B POS    HCG, QUANTITATIVE, PREGNANCY     Status: Abnormal   Collection Time    07/17/14  5:15 PM      Result Value Ref Range   hCG, Beta Chain, Quant, S 13086 (*) <5 mIU/mL   Comment:              GEST. AGE      CONC.  (mIU/mL)       <=1 WEEK        5 - 50         2 WEEKS       50 - 500         3 WEEKS       100 - 10,000         4 WEEKS     1,000 - 30,000         5 WEEKS     3,500 - 115,000       6-8 WEEKS     12,000 - 270,000        12 WEEKS     15,000 - 220,000                FEMALE AND NON-PREGNANT FEMALE:         LESS THAN 5 mIU/mL  WET PREP, GENITAL     Status: Abnormal   Collection Time    07/17/14  5:30 PM      Result Value Ref Range   Yeast Wet  Prep HPF POC NONE SEEN  NONE SEEN   Trich, Wet Prep NONE SEEN  NONE SEEN   Clue Cells Wet Prep HPF POC NONE SEEN  NONE SEEN   WBC, Wet Prep HPF POC FEW (*) NONE SEEN   Comment: FEW BACTERIA SEEN   US Ob Comp Less 14 Wks  07/17/2014   CLINICAL DATA:  Lower abdominal pain. Discharge. Certain LMP of 05/18/2014. Previous SAB x1. Quantitative beta HCG is 77,465. Gravida 6 para 4. GA by LMP is 8 weeks 4 days.  EXAM: OBSTETRIC <14 WK [place] AND TRANSVAGINAL OB US  TECHNIQUE: Both transabdominal and transvaginal ultrasound examinations were performed for complete evaluation of the gestation as well as the maternal uterus, adnexal regions, and pelvic cul-de-sac. Transvaginal technique was performed to assess early pregnancy.  COMPARISON:  None.  FINDINGS: Intrauterine gestational sac: Visualized/normal in shape.  Yolk sac:  Present  Embryo:  Present  Cardiac Activity: Present  Heart Rate:  167 bpm  CRL:   22.8  mm   9 w 0 d                  [place] EDC: 02/19/2015  Maternal uterus/adnexae: Moderate subchorionic hemorrhage is present. A corpus luteum cyst  is identified in the right ovary. The left ovary has a normal appearance. No free pelvic fluid.  IMPRESSION: 1. Single living intrauterine embryo at 9 weeks 0 days by ultrasound. 2. Today's ultrasound confirms the clinical dating of 8 weeks 4 days. 3. EDC by LMP and confirmed by today's ultrasound is 02/22/2015. 4. Moderate subchorionic hemorrhage.   Electronically Signed   By: Rosalie Gums M.D.   On: 07/17/2014 19:05   US Ob Transvaginal  07/17/2014   CLINICAL DATA:  Lower abdominal pain. Discharge. Certain LMP of 05/18/2014. Previous SAB x1. Quantitative beta HCG is 77,465. Gravida 6 para 4. GA by LMP is 8 weeks 4 days.  EXAM: OBSTETRIC <14 WK [place] AND TRANSVAGINAL OB US  TECHNIQUE: Both transabdominal and transvaginal ultrasound examinations were performed for complete evaluation of the gestation as well as the maternal uterus, adnexal regions, and pelvic cul-de-sac.  Transvaginal technique was performed to assess early pregnancy.  COMPARISON:  None.  FINDINGS: Intrauterine gestational sac: Visualized/normal in shape.  Yolk sac:  Present  Embryo:  Present  Cardiac Activity: Present  Heart Rate:  167 bpm  CRL:   22.8  mm   9 w 0 d                  [place] EDC: 02/19/2015  Maternal uterus/adnexae: Moderate subchorionic hemorrhage is present. A corpus luteum cyst is identified in the right ovary. The left ovary has a normal appearance. No free pelvic fluid.  IMPRESSION: 1. Single living intrauterine embryo at 9 weeks 0 days by ultrasound. 2. Today's ultrasound confirms the clinical dating of 8 weeks 4 days. 3. EDC by LMP and confirmed by today's ultrasound is 02/22/2015. 4. Moderate subchorionic hemorrhage.   Electronically Signed   By: Rosalie Gums M.D.   On: 07/17/2014 19:05     Review of Systems  Constitutional: Negative for fever and chills.  Gastrointestinal: Positive for abdominal pain (L>R). Negative for nausea, vomiting, diarrhea and constipation.  Genitourinary: Negative for dysuria, urgency, frequency and hematuria.       + vaginal discharge; brown vaginal discharge  No vaginal bleeding. No dysuria.   Neurological: Negative for headaches.   Physical Exam   Blood pressure 136/74, pulse 82, temperature 99.2 F (37.3 C), temperature source Oral, resp. rate 18, height 5' 2.5" (1.588 m), weight 78.926 kg (174 lb), last menstrual period 05/18/2014.  Physical Exam  Constitutional: She is oriented to person, place, and time. She appears well-developed and well-nourished. No distress.  HENT:  Head: Normocephalic.  Eyes: Pupils are equal, round, and reactive to light.  Neck: Neck supple.  Respiratory: Effort normal.  GI: Soft. Normal appearance. There is tenderness in the suprapubic area. There is no rigidity, no rebound and no guarding.  Genitourinary:  Speculum exam: Vagina - Small amount of creamy, brown discharge, no odor, no pooling of fluid or  blood Cervix - No contact bleeding Bimanual exam: Cervix closed Uterus non tender, normal size Adnexa non tender, no masses bilaterally GC/Chlam, wet prep done Chaperone present for exam.   Musculoskeletal: Normal range of motion.  Neurological: She is alert and oriented to person, place, and time.  Skin: Skin is warm. She is not diaphoretic.  Psychiatric: Her behavior is normal.    MAU Course  Procedures None  MDM UA UPT CBC, CMET, ABO, Wet prep, GC, HIV [place]  B positive blood type  Assessment and Plan   A: Vaginal bleeding in pregnancy  Subchorionic hemorrhage  IUP at [redacted]w[redacted]d  P:  Discharge home in stable condition Pelvic rest Bleeding precautions Patient started care at [facility], patient encouraged to follow up there Return to MAU if symptoms worsen   Iona Hansen Nathanael Krist, NP 07/17/2014, 5:08 PM

## 2014-07-17 NOTE — MAU Note (Addendum)
+  [facility] Pain in lower abd started a few days ago.  Brown d/c past 2 days.

## 2014-07-17 NOTE — MAU Note (Signed)
Pt states that she has had abd pain for several weeks. dx with a UTI and +UPT on 8/26. Med was given for UTI, pt states she completed it fully. Also c/o pelvic pressure and dark brown discharge since yesterday. C/o weakness and achy all over but more so on the left side since Sunday.

## 2014-07-17 NOTE — Discharge Instructions (Signed)
Reposo plvico  (Pelvic Rest) El reposo plvico se recomienda a las mujeres cuando:   La placenta cubre parcial o completamente la abertura del cuello del tero (placenta previa).  Hay sangrado entre la pared del tero y el saco amnitico en el primer trimestre (hemorragia subcorinica).  El cuello uterino comienza a abrirse sin iniciarse el trabajo de parto (cuello uterino incompetente, insuficiencia cervical).  El [place] de parto se inicia muy pronto (parto prematuro). INSTRUCCIONES PARA EL CUIDADO EN EL HOGAR   No tenga relaciones sexuales, estimulacin, ni orgasmos.  No use tampones, no se haga duchas vaginales ni coloque ningn objeto en la vagina.  No levante objetos que pesen ms de 10 libras (4,5 kg).  Evite las actividades extenuantes o tensionar los msculos de la pelvis. SOLICITE ATENCIN MDICA SI:   Tiene un sangrado vaginal durante el embarazo. Considrelo como una posible emergencia.  Siente clicos en la zona baja del estmago (ms fuertes que los clicos menstruales).  Nota flujo vaginal (acuoso, con moco o [place]).  Siente un dolor en la espalda leve y sordo.  Tiene contracciones regulares o endurecimiento del tero. SOLICITE ATENCIN MDICA DE INMEDIATO SI:  Observa sangrado vaginal y tiene placenta previa.  Document Released: 07/12/2012 [facility] Patient Information 2015 [place], [place]. This information is not intended to replace advice given to you by your health care provider. Make sure you discuss any questions you have with your health care provider.  Hematoma subcorinico (Subchorionic Hematoma) Un hematoma subcorinico es una acumulacin de sangre entre la pared externa de la placenta y la pared interna del la matriz (tero). La placenta es el rgano que conecta el feto a la pared del tero. La placenta realiza la funcin de alimentacin, respiracin (oxgeno al feto) y el trabajo de eliminacin de desechos (excrecin) del feto.  Un hematoma  subcorinico es la anormalidad ms frecuente encontrada en una ecografa durante el primer trimestre o principios del segundo trimestre del embarazo. Si ha habido poca o ninguna hemorragia vaginal, generalmente los pequeos hematomas se reducen por su propia cuenta y no afectan al beb ni al Vanetta Mulders. La sangre es absorbida gradualmente durante una o [place]. Cuando la hemorragia comienza ms tarde en el embarazo o el hematoma es ms grande o se produce en una paciente de edad avanzada, el resultado puede no ser tan bueno. Los grandes hematomas pueden agrandarse an ms y [place] las posibilidades de aborto espontneo. El hematoma subcorinico tambin aumenta el riesgo de desprendimiento precoz de la placenta del tero, muerte fetal y Sport and exercise psychologist. INSTRUCCIONES PARA EL CUIDADO EN EL HOGAR   Repose en cama si el mdico se lo recomienda. Aunque el reposo en cama no evitar la hemorragia o un aborto espontneo, su mdico puede recomendarlo.  Evite levantar objetos pesados (ms de 10 libras [4,5 kg]), hacer ejercicio, tener relaciones sexuales o realizar duchas vaginales segn se lo indique el profesional.  Lleve un registro de la cantidad y Hydrographic surveyor de remojo (saturacin) de las toallas higinicas que Landscape architect. [place] esta informacin.  No use tampones.  Cumpla con todas las visitas de control, segn le indique su mdico. El profesional podr pedirle que se realice anlisis de seguimiento, pruebas de [place] o [place]. SOLICITE ATENCIN MDICA DE INMEDIATO SI:   Siente calambres intensos en el estmago, en la espalda, en el abdomen o en la pelvis.  Tiene fiebre.  Elimina cogulos o tejidos grandes. Guarde los tejidos para que su mdico los vea.  Si la hemorragia aumenta o siente  mareos, debilidad o tiene episodios de [address]. Document Released: 02/03/2009 Document Revised: 08/08/2013 [facility] Patient Information 2015 [place], [place]. This information is not intended to replace  advice given to you by your health care provider. Make sure you discuss any questions you have with your health care provider.  Hemorragia vaginal durante el embarazo (primer trimestre) (Vaginal Bleeding During Pregnancy, First Trimester) Durante los primeros meses de [place], es comn tener una pequea hemorragia vaginal (manchas). A veces, la hemorragia es normal y no representa un problema, pero en algunas ocasiones es un sntoma de algo grave. Asegrese de decirle a su mdico de inmediato si tiene algn tipo de hemorragia vaginal. CUIDADOS EN EL HOGAR  Controle su afeccin para ver si hay cambios.  Siga las indicaciones de su mdico con respecto al [employer] de actividad que [place].  Si debe hacer reposo en cama:  Es posible que deba quedarse en cama y levantarse nicamente para ir al bao.  Quizs le permitan hacer [employer].  Si es necesario, planifique que alguien la ayude.  Marcelino [place]:  La cantidad de toallas higinicas que [place] cada da.  La frecuencia con la que se cambia las toallas higinicas.  Indique que tan empapados (saturados) estn.  No use tampones.  No se haga duchas vaginales.  No tenga relaciones sexuales ni orgasmos hasta que el mdico la autorice.  Si elimina tejido por la vagina, gurdelo para mostrrselo al [employer].  Tome los medicamentos solamente como se lo haya indicado el mdico.  No tome aspirina, ya que puede causar hemorragias.  Concurra a todas las visitas de control como se lo haya indicado el mdico. SOLICITE AYUDA SI:   Tiene una hemorragia vaginal.  Tiene clicos.  Tiene dolores de [place].  Tiene fiebre que no desaparece despus de Teacher, adult education. SOLICITE AYUDA DE INMEDIATO SI:   Siente clicos muy intensos en la espalda o en el vientre (abdomen).  Elimina cogulos grandes o tejido por la vagina.  Tiene ms hemorragia.  Se siente dbil o que va a desvanecerse.  Pierde el conocimiento (se desmaya).  Tiene  escalofros.  Tiene una prdida importante o sale lquido a borbotones por la vagina.  Se desmaya mientras defeca. ASEGRESE DE QUE:  Comprende estas instrucciones.  Controlar su afeccin.  Recibir ayuda de inmediato si no mejora o si empeora. Document Released: 03/04/2014 [facility] Patient Information 2015 [place], [place]. This information is not intended to replace advice given to you by your health care provider. Make sure you discuss any questions you have with your health care provider.

## 2014-07-18 LAB — GC/CHLAMYDIA PROBE AMP
CT PROBE, AMP APTIMA: NEGATIVE
GC Probe RNA: NEGATIVE

## 2014-07-18 LAB — HIV ANTIBODY (ROUTINE TESTING W REFLEX): HIV 1&2 Ab, 4th Generation: NONREACTIVE

## 2014-07-22 NOTE — MAU Provider Note (Signed)
Attestation of Attending Supervision of Advanced Practitioner: Evaluation and management procedures were performed by the PA/NP/CNM/OB Fellow under my supervision/collaboration. Chart reviewed and agree with management and plan.  Shreyas Piatkowski V 07/22/2014 3:04 PM

## 2014-08-01 ENCOUNTER — Encounter (HOSPITAL_COMMUNITY): Payer: Self-pay

## 2014-08-01 ENCOUNTER — Encounter (HOSPITAL_COMMUNITY): Payer: Self-pay | Admitting: *Deleted

## 2014-08-12 LAB — OB RESULTS CONSOLE VARICELLA ZOSTER ANTIBODY, IGG: VARICELLA IGG: IMMUNE

## 2014-08-12 LAB — OB RESULTS CONSOLE RUBELLA ANTIBODY, IGM: Rubella: IMMUNE

## 2014-08-12 LAB — OB RESULTS CONSOLE ABO/RH: RH Type: POSITIVE

## 2014-08-12 LAB — OB RESULTS CONSOLE GC/CHLAMYDIA
CHLAMYDIA, DNA PROBE: NEGATIVE
Gonorrhea: NEGATIVE

## 2014-08-12 LAB — OB RESULTS CONSOLE RPR: RPR: NONREACTIVE

## 2014-08-12 LAB — OB RESULTS CONSOLE HEPATITIS B SURFACE ANTIGEN: HEP B S AG: NEGATIVE

## 2014-08-13 ENCOUNTER — Other Ambulatory Visit (HOSPITAL_COMMUNITY): Payer: Self-pay | Admitting: Physician Assistant

## 2014-08-13 DIAGNOSIS — Z3682 Encounter for antenatal screening for nuchal translucency: Secondary | ICD-10-CM

## 2014-08-16 ENCOUNTER — Ambulatory Visit (HOSPITAL_COMMUNITY)
Admission: RE | Admit: 2014-08-16 | Discharge: 2014-08-16 | Disposition: A | Discharge status: Home / Self Care | Payer: Medicaid Other | Source: Ambulatory Visit | Attending: Physician Assistant | Admitting: Physician Assistant

## 2014-08-16 ENCOUNTER — Encounter (HOSPITAL_COMMUNITY): Payer: Self-pay

## 2014-08-16 ENCOUNTER — Ambulatory Visit (HOSPITAL_COMMUNITY): Admission: RE | Admit: 2014-08-16 | Payer: Medicaid Other | Source: Ambulatory Visit

## 2014-08-16 DIAGNOSIS — Z315 Encounter for genetic counseling: Secondary | ICD-10-CM | POA: Insufficient documentation

## 2014-08-16 DIAGNOSIS — Z36 Encounter for antenatal screening of mother: Secondary | ICD-10-CM | POA: Diagnosis not present

## 2014-08-16 DIAGNOSIS — O09529 Supervision of elderly multigravida, unspecified trimester: Secondary | ICD-10-CM | POA: Insufficient documentation

## 2014-08-16 DIAGNOSIS — Z3A12 12 weeks gestation of pregnancy: Secondary | ICD-10-CM | POA: Insufficient documentation

## 2014-08-16 DIAGNOSIS — Z3682 Encounter for antenatal screening for nuchal translucency: Secondary | ICD-10-CM

## 2014-08-16 DIAGNOSIS — O09521 Supervision of elderly multigravida, first trimester: Secondary | ICD-10-CM | POA: Insufficient documentation

## 2014-08-16 NOTE — Progress Notes (Signed)
Genetic Counseling  High-Risk Gestation Note  Appointment Date:  08/16/2014 Referred By: Quentin Mullingriplett, John J, PA-C Date of Birth:  Nov 25, 1975   Pregnancy History: [id number] Estimated Date of Delivery: 02/22/15 Estimated Gestational Age: 2219w6d Attending: Particia NearingMartha Decker, MD   [name] was seen for genetic counseling because of a maternal age of 38 y.o..  Spanish/English medical interpreter, Kathleen Tucker, provided interpretation for today's visit.   She was counseled regarding maternal age and the association with risk for chromosome conditions due to nondisjunction with aging of the ova.  We reviewed chromosomes, nondisjunction, and the associated 1 in 3987 risk for fetal aneuploidy at 5919w6d gestation related to a maternal age of 38 y.o. at delivery.  She was counseled that the risk for aneuploidy decreases as gestational age increases, accounting for those pregnancies which spontaneously abort.  We specifically discussed Down syndrome (trisomy 8121), trisomies 1313 and 4318, and sex chromosome aneuploidies (47,XXX and 47,XXY) including the common features and prognoses of each.   We reviewed available screening options including First Screen, Quad screen, noninvasive prenatal screening (NIPS)/cell free DNA (cfDNA) testing, and detailed ultrasound.  She was counseled that screening tests are used to modify a patient's a priori risk for aneuploidy, typically based on age. This estimate provides a pregnancy specific risk assessment. We reviewed the benefits and limitations of each option. Specifically, we discussed the conditions for which each test screens, the detection rates, and false positive rates of each. She was also counseled regarding diagnostic testing via CVS and amniocentesis. We reviewed the approximate 1 in 300-500 risk for complications for amniocentesis, including spontaneous pregnancy loss. After consideration of all the options, she elected to proceed with ultrasound only in the pregnancy.  She declined additional screening and testing for fetal aneuploidy including first trimester serum screening, Quad screen, NIPS, and invasive testing (amniocentesis).   She expressed interest in pursuing a nuchal translucency ultrasound, which was performed today.  The report will be documented separately.  The patient would like to return for a detailed ultrasound at ~18+ weeks gestation.  This appointment was scheduled for 09/27/14. She understands that screening tests cannot rule out all birth defects or genetic syndromes. The patient was advised of this limitation and states she still does not want additional testing at this time.   Both family histories were reviewed and found to be contributory for a niece to the patient who is unable to walk and unable to lift her head. This relative, the patient's sister's daughter, is reportedly 36 years old, and her features are reportedly attributed to a problem with the baby's delivery. They currently reside in [place]; medical records are not readily accessible. The patient reported that her sister's additional daughters are healthy and that the children of her other three sisters and one brother are all healthy as well. No additional relatives were reported with similar features. We discussed that there can be various causes for physical and/or mental disabilities including genetic (which may be inherited or sporadic), environmental, or multifactorial. Given the reported family history, recurrence risk for the current pregnancy would likely be low. However, additional information regarding the underlying etiology may alter recurrence risk assessment.  Without further information regarding the provided family history, an accurate genetic risk cannot be calculated. Further genetic counseling is warranted if more information is obtained.  [name] denied exposure to environmental toxins or chemical agents. She denied the use of alcohol, tobacco or  street drugs. She denied significant viral illnesses during the course of her  pregnancy. Her medical and surgical histories were noncontributory.   I counseled [name] regarding the above risks and available options.  The approximate face-to-face time with the genetic counselor was 45 minutes.  Quinn PlowmanKaren Miyanna Wiersma, MS,  Certified Genetic Counselor 08/16/2014

## 2014-08-29 ENCOUNTER — Other Ambulatory Visit (HOSPITAL_COMMUNITY): Payer: Self-pay | Admitting: Maternal and Fetal Medicine

## 2014-08-29 DIAGNOSIS — O09292 Supervision of pregnancy with other poor reproductive or obstetric history, second trimester: Secondary | ICD-10-CM

## 2014-08-29 DIAGNOSIS — O09522 Supervision of elderly multigravida, second trimester: Secondary | ICD-10-CM

## 2014-09-02 ENCOUNTER — Encounter (HOSPITAL_COMMUNITY): Payer: Self-pay

## 2014-09-09 LAB — OB RESULTS CONSOLE ANTIBODY SCREEN: Antibody Screen: NEGATIVE

## 2014-09-13 ENCOUNTER — Encounter (HOSPITAL_COMMUNITY): Payer: Self-pay | Admitting: General Practice

## 2014-09-13 ENCOUNTER — Inpatient Hospital Stay (HOSPITAL_COMMUNITY)
Admission: AD | Admit: 2014-09-13 | Discharge: 2014-09-13 | Disposition: A | Discharge status: Home / Self Care | Payer: Medicaid Other | Source: Ambulatory Visit | Attending: Obstetrics and Gynecology | Admitting: Obstetrics and Gynecology

## 2014-09-13 DIAGNOSIS — N949 Unspecified condition associated with female genital organs and menstrual cycle: Secondary | ICD-10-CM

## 2014-09-13 DIAGNOSIS — Z3A14 14 weeks gestation of pregnancy: Secondary | ICD-10-CM | POA: Insufficient documentation

## 2014-09-13 DIAGNOSIS — O26892 Other specified pregnancy related conditions, second trimester: Secondary | ICD-10-CM | POA: Insufficient documentation

## 2014-09-13 DIAGNOSIS — R102 Pelvic and perineal pain: Secondary | ICD-10-CM | POA: Insufficient documentation

## 2014-09-13 LAB — URINALYSIS, ROUTINE W REFLEX MICROSCOPIC
Bilirubin Urine: NEGATIVE
GLUCOSE, UA: NEGATIVE mg/dL
KETONES UR: NEGATIVE mg/dL
LEUKOCYTES UA: NEGATIVE
NITRITE: NEGATIVE
PROTEIN: NEGATIVE mg/dL
Specific Gravity, Urine: 1.025 (ref 1.005–1.030)
UROBILINOGEN UA: 0.2 mg/dL (ref 0.0–1.0)
pH: 6.5 (ref 5.0–8.0)

## 2014-09-13 LAB — URINE MICROSCOPIC-ADD ON

## 2014-09-13 MED ORDER — ACETAMINOPHEN-CODEINE #3 300-30 MG PO TABS: 1.0000 | ORAL_TABLET | Freq: Four times a day (QID) | ORAL | Status: DC | PRN

## 2014-09-13 NOTE — Discharge Instructions (Signed)
Segundo trimestre del embarazo  (Second Trimester of Pregnancy)  El segundo trimestre del embarazo se extiende desde la semana 13 hasta la semana 28, del 4 al 6 mes. En general, es el momento del embarazo en el que se sentir mejor. En general, las nuseas matutinas disminuyen o desaparecen. Tendr ms energa y podr aumentarle el apetito. El beb por nacer (feto) se desarrolla rpidamente. Hacia el final del sexto mes, el beb mide aproximadamente 9 pulgadas (23 cm) y pesa alrededor de 1 libras (700 g). Es probable que sienta mover al beb (dar pataditas) entre las 18 y 20 semanas del embarazo. CUIDADOS EN EL HOGAR   Evite fumar, consumir hierbas y beber alcohol. Evite los frmacos que no apruebe el mdico.  Slo tome los medicamentos que le haya indicado su mdico. Algunos medicamentos son seguros para tomar durante el embarazo y otros no lo son.  Haga ejercicios slo como le indique el mdico. Deje de hacer ejercicios si comienza a tener clicos.  Haga comidas regulares y sanas.  Use un sostn que le brinde buen soporte si sus mamas estn sensibles.  No utilice la baera con agua caliente, baos turcos y saunas.  Colquese el cinturn de seguridad cuando conduzca.  Evite comer carne cruda y el contacto con los utensilios y desperdicios de los gatos.  Tome las vitaminas indicadas para la etapa prenatal.  Trate de tomar medicamentos para mover el intestino (laxantes) segn lo necesario y si su mdico la autoriza. Consuma ms fibra comiendo frutas y vegetales frescos y granos enteros. Beba gran cantidad de lquido para mantener el pis (orina) de tono claro o amarillo plido.  Tome baos de agua tibia (baos de asiento) para calmar el dolor o las molestias causadas por las hemorroides. Use una crema para las hemorroides si el mdico la autoriza.  Si tiene venas hinchadas y abultadas (venas varicosas), use medias de soporte. Eleve (levante) los pies durante 15 minutos, 3 o 4 veces por  da. Limite el consumo de sal en su dieta.  Evite levantar objetos pesados, usar tacones altos y sintese derecha.  Descanse con las piernas elevadas si tiene calambres o dolor de cintura.  Visite a su dentista si no lo ha hecho durante el embarazo. Use un cepillo de dientes blando para higienizarse los dientes. Use suavemente el hilo dental.  Puede tener sexo (relaciones sexuales) siempre que el mdico la autorice.  Concurra a los controles mdicos. SOLICITE AYUDA SI:   Siente mareos.  Siente clicos intensos en el estmago, en la espalda o en el vientre (abdomen).  Siente un dolor persistente en la zona del vientre.  Tiene malestar estomacal (nuseas), devuelve (vomita), o tiene deposiciones acuosas (diarrea).  Advierte un olor ftido que proviene de la vagina.  Siente dolor al hacer pis (orinar). SOLICITE AYUDA DE INMEDIATO SI:   Tiene fiebre.  Pierde lquido por la vagina.  Tiene sangrando o pequeas prdidas vaginales.  Siente dolor intenso o clicos en el abdomen.  Sube o baja de peso rpidamente.  Tiene dificultad para respirar o siente dolor en el pecho.  Sbitamente se le hinchan el rostro, las manos, los tobillos, los pies o las piernas.  No ha sentido los movimientos del beb durante una hora.  Siente un dolor de cabeza intenso que no se alivia con medicamentos.  Su visin se modifica. Document Released: 06/20/2013 [employer] Patient Information 2015 [employer]. This information is not intended to replace advice given to you by your health care provider. Make sure you   discuss any questions you have with your health care provider.  

## 2014-09-13 NOTE — MAU Note (Signed)
Abd pain since Tues morning. Cramping pain that is constant. Denies bleeding

## 2014-09-13 NOTE — MAU Provider Note (Signed)
  History     CSN: [id number]  Arrival date and time: 09/13/14 [id number]   First Provider Initiated Contact with Patient 09/13/14 1947      Chief Complaint  Patient presents with  . Abdominal Pain   HPI Comments: Kathleen Tucker 38 y.o. [id number] 8154w6d presents to MAU with low pelvic pains that have been ongoing since Tuesday. [facility]. The pains are constant and feel like cramping. She took tylenol today only with little relief. She denies any LOF or vaginal bleeding. +FHT   Abdominal Pain      History reviewed. No pertinent past medical history.  History reviewed. No pertinent past surgical history.  History reviewed. No pertinent family history.  History  Substance Use Topics  . Smoking status: Never Smoker   . Smokeless tobacco: Not on file  . Alcohol Use: No    Allergies: No Known Allergies  Prescriptions prior to admission  Medication Sig Dispense Refill Last Dose  . acetaminophen (TYLENOL) 500 MG tablet Take 1,000 mg by mouth every 6 (six) hours as needed. For pain   09/13/2014 at Unknown time  . Prenatal Vit-Fe Fumarate-FA (PRENATAL MULTIVITAMIN) TABS tablet Take 1 tablet by mouth at bedtime.    09/12/2014 at Unknown time  . HYDROcodone-acetaminophen (NORCO/VICODIN) 5-325 MG per tablet Take 1 tablet by mouth every 4 (four) hours as needed. For pain   10/15/2012 at Unknown  . ibuprofen (ADVIL,MOTRIN) 600 MG tablet Take 1 tablet (600 mg total) by mouth every 6 (six) hours as needed for pain. (Patient not taking: Reported on 09/13/2014) 30 tablet 0   . metroNIDAZOLE (FLAGYL) 500 MG tablet Take 500 mg by mouth 2 (two) times daily.   Past Month at Unknown time    Review of Systems  Constitutional: Negative.   HENT: Negative.   Eyes: Negative.   Respiratory: Negative.   Cardiovascular: Negative.   Gastrointestinal: Positive for abdominal pain.  Genitourinary: Negative.   Musculoskeletal: Negative.   Skin: Negative.    Neurological: Negative.   Psychiatric/Behavioral: Negative.    Physical Exam   Blood pressure 127/66, pulse 89, temperature 97.7 F (36.5 C), resp. rate 20, height 5\' 4"  (1.626 m), weight 83.462 kg (184 lb), last menstrual period 05/18/2014.  Physical Exam  Constitutional: She is oriented to person, place, and time. She appears well-developed and well-nourished. No distress.  Spanish Interpreter used  HENT:  Head: Normocephalic and atraumatic.  Eyes: Pupils are equal, round, and reactive to light.  GI: Soft. Bowel sounds are normal. She exhibits no distension. There is tenderness. There is no rebound and no guarding.  Genitourinary:  Genital:external negative Vaginal:small amount creamy discharge Cervix:closed/ thick Bimanual:uterus nontender/ tenderness in round ligament areas   Musculoskeletal: Normal range of motion.  Neurological: She is alert and oriented to person, place, and time.  Skin: Skin is warm and dry.  Psychiatric: She has a normal mood and affect. Her behavior is normal. Judgment and thought content normal.   +FHT  MAU Course  Procedures  MDM   Assessment and Plan  A: Round Ligament Pains  P: Advised Maternity Belt/ warm baths/ limit weight gain Tylenol # 3 po prn Follow up with [facility] Return to MAU as needed   Carolynn ServeBarefoot, Bram Hottel Miller 09/13/2014, 8:42 PM

## 2014-09-27 ENCOUNTER — Encounter (HOSPITAL_COMMUNITY): Payer: Self-pay

## 2014-09-27 ENCOUNTER — Ambulatory Visit (HOSPITAL_COMMUNITY)
Admission: RE | Admit: 2014-09-27 | Discharge: 2014-09-27 | Disposition: A | Discharge status: Home / Self Care | Payer: Medicaid Other | Source: Ambulatory Visit | Attending: Physician Assistant | Admitting: Physician Assistant

## 2014-09-27 ENCOUNTER — Other Ambulatory Visit (HOSPITAL_COMMUNITY): Payer: Self-pay | Admitting: Maternal and Fetal Medicine

## 2014-09-27 VITALS — BP 123/59 | HR 81 | Wt 181.0 lb

## 2014-09-27 DIAGNOSIS — O09299 Supervision of pregnancy with other poor reproductive or obstetric history, unspecified trimester: Secondary | ICD-10-CM | POA: Diagnosis present

## 2014-09-27 DIAGNOSIS — Z3A18 18 weeks gestation of pregnancy: Secondary | ICD-10-CM | POA: Insufficient documentation

## 2014-09-27 DIAGNOSIS — Z36 Encounter for antenatal screening of mother: Secondary | ICD-10-CM | POA: Insufficient documentation

## 2014-09-27 DIAGNOSIS — O4452 Low lying placenta with hemorrhage, second trimester: Secondary | ICD-10-CM | POA: Insufficient documentation

## 2014-09-27 DIAGNOSIS — O09292 Supervision of pregnancy with other poor reproductive or obstetric history, second trimester: Secondary | ICD-10-CM

## 2014-09-27 DIAGNOSIS — O09529 Supervision of elderly multigravida, unspecified trimester: Secondary | ICD-10-CM | POA: Insufficient documentation

## 2014-09-27 DIAGNOSIS — IMO0002 Reserved for concepts with insufficient information to code with codable children: Secondary | ICD-10-CM

## 2014-09-27 DIAGNOSIS — Z0489 Encounter for examination and observation for other specified reasons: Secondary | ICD-10-CM

## 2014-09-27 DIAGNOSIS — O09522 Supervision of elderly multigravida, second trimester: Secondary | ICD-10-CM | POA: Diagnosis present

## 2014-11-01 DIAGNOSIS — E119 Type 2 diabetes mellitus without complications: Secondary | ICD-10-CM

## 2014-11-01 HISTORY — DX: Type 2 diabetes mellitus without complications: E11.9

## 2014-11-01 NOTE — L&D Delivery Note (Addendum)
Patient is 39 y.o. [id number] 8129w1d admitted for IOL 2/2 A2DM on glyburide, hx of elevated BP on admission with normal preE labs and subsequent normal BPs   Delivery Note At 3:08 AM a viable female was delivered via Vaginal, Spontaneous Delivery (Presentation: Left Occiput Anterior).  APGAR: 8, 9; weight pending Placenta status: Intact, Spontaneous.  Cord: 3 vessels with the following complications: None.   Nuchal cord x 1, somersault maneuver successful without complication.  Anesthesia: None  Episiotomy: None Lacerations:  none Suture Repair: n/a Est. Blood Loss (mL):  350mL, firm with gentle massage  Mom to postpartum.  Baby to Couplet care / Skin to Skin.  Tyreanna Bisesi ROCIO 02/16/2015, 3:21 AM

## 2014-11-08 ENCOUNTER — Ambulatory Visit (HOSPITAL_COMMUNITY)
Admission: RE | Admit: 2014-11-08 | Discharge: 2014-11-08 | Disposition: A | Discharge status: Home / Self Care | Payer: Medicaid Other | Source: Ambulatory Visit | Attending: Physician Assistant | Admitting: Physician Assistant

## 2014-11-08 ENCOUNTER — Other Ambulatory Visit (HOSPITAL_COMMUNITY): Payer: Self-pay | Admitting: Maternal and Fetal Medicine

## 2014-11-08 ENCOUNTER — Encounter (HOSPITAL_COMMUNITY): Payer: Self-pay

## 2014-11-08 VITALS — BP 133/68 | HR 95 | Wt 190.8 lb

## 2014-11-08 DIAGNOSIS — O09292 Supervision of pregnancy with other poor reproductive or obstetric history, second trimester: Secondary | ICD-10-CM | POA: Insufficient documentation

## 2014-11-08 DIAGNOSIS — Z0489 Encounter for examination and observation for other specified reasons: Secondary | ICD-10-CM

## 2014-11-08 DIAGNOSIS — IMO0002 Reserved for concepts with insufficient information to code with codable children: Secondary | ICD-10-CM

## 2014-11-08 DIAGNOSIS — Z36 Encounter for antenatal screening of mother: Secondary | ICD-10-CM | POA: Insufficient documentation

## 2014-11-08 DIAGNOSIS — O09522 Supervision of elderly multigravida, second trimester: Secondary | ICD-10-CM

## 2014-12-05 ENCOUNTER — Other Ambulatory Visit (HOSPITAL_COMMUNITY): Payer: Self-pay | Admitting: Maternal and Fetal Medicine

## 2014-12-05 ENCOUNTER — Ambulatory Visit (HOSPITAL_COMMUNITY)
Admission: RE | Admit: 2014-12-05 | Discharge: 2014-12-05 | Disposition: A | Discharge status: Home / Self Care | Payer: Medicaid Other | Source: Ambulatory Visit | Attending: Physician Assistant | Admitting: Physician Assistant

## 2014-12-05 ENCOUNTER — Encounter (HOSPITAL_COMMUNITY): Payer: Self-pay

## 2014-12-05 DIAGNOSIS — O09292 Supervision of pregnancy with other poor reproductive or obstetric history, second trimester: Secondary | ICD-10-CM

## 2014-12-05 DIAGNOSIS — O4452 Low lying placenta with hemorrhage, second trimester: Secondary | ICD-10-CM

## 2014-12-05 DIAGNOSIS — Z0489 Encounter for examination and observation for other specified reasons: Secondary | ICD-10-CM

## 2014-12-05 DIAGNOSIS — IMO0002 Reserved for concepts with insufficient information to code with codable children: Secondary | ICD-10-CM

## 2014-12-05 DIAGNOSIS — Z3A28 28 weeks gestation of pregnancy: Secondary | ICD-10-CM | POA: Insufficient documentation

## 2014-12-05 DIAGNOSIS — O09522 Supervision of elderly multigravida, second trimester: Secondary | ICD-10-CM

## 2014-12-05 DIAGNOSIS — O09293 Supervision of pregnancy with other poor reproductive or obstetric history, third trimester: Secondary | ICD-10-CM | POA: Insufficient documentation

## 2014-12-05 DIAGNOSIS — O09523 Supervision of elderly multigravida, third trimester: Secondary | ICD-10-CM

## 2014-12-12 LAB — OB RESULTS CONSOLE HGB/HCT, BLOOD
HCT: 34 %
Hemoglobin: 10.7 g/dL

## 2014-12-12 LAB — OB RESULTS CONSOLE RPR: RPR: NONREACTIVE

## 2014-12-12 LAB — GLUCOSE TOLERANCE, 1 HOUR (50G) W/O FASTING: GLUCOSE 1 HR PRENATAL, POC: 142 mg/dL

## 2014-12-30 ENCOUNTER — Ambulatory Visit: Payer: Medicaid Other | Admitting: *Deleted

## 2014-12-30 ENCOUNTER — Encounter: Payer: Self-pay | Attending: Obstetrics & Gynecology | Admitting: *Deleted

## 2014-12-30 VITALS — Ht 64.13 in | Wt 194.0 lb

## 2014-12-30 DIAGNOSIS — O24419 Gestational diabetes mellitus in pregnancy, unspecified control: Secondary | ICD-10-CM | POA: Insufficient documentation

## 2014-12-30 DIAGNOSIS — Z713 Dietary counseling and surveillance: Secondary | ICD-10-CM | POA: Insufficient documentation

## 2014-12-30 NOTE — Progress Notes (Signed)
Nutrition note: GDM diet education Pt is a newly diagnosed GDM pt & reports she had it with her previous pregnancy. Pt has gained 24# @ 5820w2d, which is > expected. (pt's prepregnancy wt: 170#) Pt reports eating 3 meals & 2 snacks/d. Pt is taking a PNV. Pt reports no N/V or heartburn. NKFA. Pt reports no physical activity. Pt received verbal & written education in Spanish via an interpreter about GDM diet. Discussed wt gain goals of 15-25# or 0.6#/wk. Pt agrees to follow GDM diet with 3 meals & 1-3 snacks/d with proper CHO/ protein combination. Pt has WIC & plans to BF. F/u in 2-4 wks Blondell RevealLaura Christos Mixson, MS, RD, LDN, [facility]

## 2015-01-02 ENCOUNTER — Ambulatory Visit (HOSPITAL_COMMUNITY)
Admission: RE | Admit: 2015-01-02 | Discharge: 2015-01-02 | Disposition: A | Discharge status: Home / Self Care | Payer: Medicaid Other | Source: Ambulatory Visit | Attending: Physician Assistant | Admitting: Physician Assistant

## 2015-01-02 ENCOUNTER — Other Ambulatory Visit: Payer: Self-pay | Admitting: Obstetrics and Gynecology

## 2015-01-02 ENCOUNTER — Encounter (HOSPITAL_COMMUNITY): Payer: Self-pay

## 2015-01-02 DIAGNOSIS — Z3A32 32 weeks gestation of pregnancy: Secondary | ICD-10-CM | POA: Insufficient documentation

## 2015-01-02 DIAGNOSIS — O09523 Supervision of elderly multigravida, third trimester: Secondary | ICD-10-CM

## 2015-01-02 DIAGNOSIS — O4452 Low lying placenta with hemorrhage, second trimester: Secondary | ICD-10-CM

## 2015-01-02 DIAGNOSIS — Z36 Encounter for antenatal screening of mother: Secondary | ICD-10-CM | POA: Insufficient documentation

## 2015-01-02 DIAGNOSIS — O09293 Supervision of pregnancy with other poor reproductive or obstetric history, third trimester: Secondary | ICD-10-CM | POA: Insufficient documentation

## 2015-01-02 NOTE — ED Notes (Signed)
Spanish Interpreter Mariel Gallego with pt. 

## 2015-01-06 ENCOUNTER — Encounter: Payer: Self-pay | Admitting: Obstetrics and Gynecology

## 2015-01-06 ENCOUNTER — Encounter: Payer: Self-pay | Attending: Obstetrics & Gynecology | Admitting: *Deleted

## 2015-01-06 ENCOUNTER — Ambulatory Visit (INDEPENDENT_AMBULATORY_CARE_PROVIDER_SITE_OTHER): Payer: Self-pay | Admitting: Obstetrics and Gynecology

## 2015-01-06 VITALS — BP 112/63 | HR 79 | Temp 98.2°F | Wt 193.6 lb

## 2015-01-06 DIAGNOSIS — O24419 Gestational diabetes mellitus in pregnancy, unspecified control: Secondary | ICD-10-CM | POA: Insufficient documentation

## 2015-01-06 DIAGNOSIS — R823 Hemoglobinuria: Secondary | ICD-10-CM | POA: Insufficient documentation

## 2015-01-06 DIAGNOSIS — O4452 Low lying placenta with hemorrhage, second trimester: Secondary | ICD-10-CM

## 2015-01-06 DIAGNOSIS — Z789 Other specified health status: Secondary | ICD-10-CM

## 2015-01-06 DIAGNOSIS — D649 Anemia, unspecified: Secondary | ICD-10-CM | POA: Insufficient documentation

## 2015-01-06 DIAGNOSIS — Z713 Dietary counseling and surveillance: Secondary | ICD-10-CM | POA: Insufficient documentation

## 2015-01-06 DIAGNOSIS — Z8759 Personal history of other complications of pregnancy, childbirth and the puerperium: Secondary | ICD-10-CM | POA: Insufficient documentation

## 2015-01-06 DIAGNOSIS — O4412 Placenta previa with hemorrhage, second trimester: Secondary | ICD-10-CM

## 2015-01-06 DIAGNOSIS — O0993 Supervision of high risk pregnancy, unspecified, third trimester: Secondary | ICD-10-CM

## 2015-01-06 DIAGNOSIS — Z603 Acculturation difficulty: Secondary | ICD-10-CM | POA: Insufficient documentation

## 2015-01-06 LAB — COMPLETE METABOLIC PANEL WITH GFR
ALBUMIN: 3.4 g/dL — AB (ref 3.5–5.2)
ALK PHOS: 89 U/L (ref 39–117)
ALT: 19 U/L (ref 0–35)
AST: 29 U/L (ref 0–37)
BUN: 10 mg/dL (ref 6–23)
CALCIUM: 8.8 mg/dL (ref 8.4–10.5)
CHLORIDE: 102 meq/L (ref 96–112)
CO2: 23 meq/L (ref 19–32)
Creat: 0.51 mg/dL (ref 0.50–1.10)
GFR, Est African American: >89 mL/min
Glucose, Bld: 90 mg/dL (ref 70–99)
POTASSIUM: 3.9 meq/L (ref 3.5–5.3)
SODIUM: 134 meq/L — AB (ref 135–145)
TOTAL PROTEIN: 6.2 g/dL (ref 6.0–8.3)
Total Bilirubin: 0.6 mg/dL (ref 0.2–1.2)

## 2015-01-06 LAB — POCT URINALYSIS DIP (DEVICE)
Bilirubin Urine: NEGATIVE
GLUCOSE, UA: NEGATIVE mg/dL
Ketones, ur: NEGATIVE mg/dL
NITRITE: NEGATIVE
PH: 6 (ref 5.0–8.0)
Protein, ur: NEGATIVE mg/dL
SPECIFIC GRAVITY, URINE: 1.02 (ref 1.005–1.030)
UROBILINOGEN UA: 0.2 mg/dL (ref 0.0–1.0)

## 2015-01-06 LAB — CBC
HEMATOCRIT: 34 % — AB (ref 36.0–46.0)
HEMOGLOBIN: 11.3 g/dL — AB (ref 12.0–15.0)
MCH: 26.3 pg (ref 26.0–34.0)
MCHC: 33.2 g/dL (ref 30.0–36.0)
MCV: 79.3 fL (ref 78.0–100.0)
MPV: 12.1 fL (ref 8.6–12.4)
Platelets: 225 10*3/uL (ref 150–400)
RBC: 4.29 MIL/uL (ref 3.87–5.11)
RDW: 14.5 % (ref 11.5–15.5)
WBC: 6.1 10*3/uL (ref 4.0–10.5)

## 2015-01-06 MED ORDER — ASPIRIN 81 MG PO TABS: 81.0000 mg | ORAL_TABLET | Freq: Every day | ORAL | Status: DC

## 2015-01-06 MED ORDER — GLYBURIDE 2.5 MG PO TABS: 2.5000 mg | ORAL_TABLET | Freq: Two times a day (BID) | ORAL | Status: DC

## 2015-01-06 NOTE — Progress Notes (Signed)
Nutrition note: GDM diet f/u Pt has been checking BS- fasting: 90-140; 2hr pp: [phone] (pt reports she had sweet tea & then she had the 243 reading - pt reports she does not drink it any more). Pt reports eating 3 meals & 2 snacks/d. Pt reports walking 20 mins/d. Reviewed recommendations of CHO servings at each meal & snack. Encouraged increasing walking to at least 30 mins/d. Pt states she understands. F/u as needed Blondell RevealLaura Kentrel Clevenger, MS, RD, LDN, [facility]

## 2015-01-06 NOTE — Progress Notes (Signed)
Diabetes Late entry:  Patient was seen on 12/30/14 for Gestational Diabetes self-management . The following learning objectives were met by the patient :   States the definition of Gestational Diabetes  States when to check blood glucose levels  Demonstrates proper blood glucose monitoring techniques  States the effect of stress and exercise on blood glucose levels  States the importance of limiting caffeine and abstaining from alcohol and smoking  Plan:  Consider  increasing your activity level by walking daily as tolerated Begin checking BG before breakfast and 2 hours after first bit of breakfast, lunch and dinner after  as directed by MD  Take medication  as directed by MD  Blood glucose monitor given: TrueTrack glucometer Blood glucose reading: 173pp  Patient instructed to monitor glucose levels: FBS: 60 - <90 2 hour: <120  Patient received the following handouts:  Nutrition Diabetes and Pregnancy  Patient will be seen for follow-up as needed.

## 2015-01-06 NOTE — Progress Notes (Signed)
Initial visit here[facility] Notes under Media Tab 01/01/15 Interpreter present for encounter Kathleen Tucker Needs to see Diabetes Educator/Nutrition

## 2015-01-06 NOTE — Assessment & Plan Note (Addendum)
Resolved on 01/02/15 5470w5d scan.

## 2015-01-06 NOTE — Progress Notes (Signed)
Patient seen last week for initial education and dispensed glucometer. She returns today with  FBS average 96-140mg /dl, pp range [id number]$[id number] /dl. Patient starting on Glyburide 2.5mg  BID. Dietary review reveales good brealfast. Does not eat lunch until 3pm questionable snacks. I will have her meet with Vernona RiegerLaura for review and recommendations.

## 2015-01-06 NOTE — Progress Notes (Signed)
39 y.o. [id number] at 6076w2d who presents for initial prenatal visit. She is a transfer [facility] the [facility] department for abnormal 3 hour GTT. Doing well today. No concerns or complaints.   Pregnancy complicated by: - AMA - Low lying placenta, resolved - Abnormal 3 hr GTT  PMHx:  - History of pregnancy induced hypertension in prior pregnancy  PSHx: - None  Allergies: - NKDA  Medications: - Prenatal vitamin  Examination: BP 112/63 mmHg  Pulse 79  Temp(Src) 98.2 F (36.8 C)  Wt 193 lb 9.6 oz (87.816 kg)  LMP 05/18/2014 Gen: well appearing, no acute distress HEENT: MMM, supple, no thyromegaly CV: normal rate Lungs: normal effort Abd: gravid, nondistended, nontender Ext: no edema  Assessment/Plan: 1. History of pregnancy induced hypertension in last pregnancy. Blood pressure at goal today. She does not appear to have baseline HELLP labs. CBC, CMP, UPC today. Start aspirin 81 mg daily.  2. GDM. New diagnosis. Will meet with nutrition and diabetes education today. Return to clinic in 1 week with blood sugar values. Growth scan at 5623w5d EFW 2125 (66ile) 3. Anterior low lying placenta, resolved on 5823w5d scan.  4. AMA. Received genetic counseling. QUAD neg.  5. Routine PNC. Reviewed prenatal record including labs. Normal female anatomy. FM/PTL precautions reviewed. RTC in 1 week to review blood glucose log.

## 2015-01-06 NOTE — Addendum Note (Signed)
Addended by: William DaltonMCEACHERN, [employer] A on: 01/06/2015 10:26 AM   Modules accepted: Orders

## 2015-01-07 ENCOUNTER — Encounter: Payer: Self-pay | Admitting: *Deleted

## 2015-01-07 LAB — PROTEIN / CREATININE RATIO, URINE
CREATININE, URINE: 63 mg/dL
Protein Creatinine Ratio: 0.25 — ABNORMAL HIGH (ref <0.15)
Total Protein, Urine: 16 mg/dL (ref 5–24)

## 2015-01-13 ENCOUNTER — Encounter: Payer: Medicaid Other | Admitting: Obstetrics & Gynecology

## 2015-01-27 ENCOUNTER — Other Ambulatory Visit: Payer: Self-pay | Admitting: Obstetrics and Gynecology

## 2015-01-27 DIAGNOSIS — R809 Proteinuria, unspecified: Secondary | ICD-10-CM

## 2015-01-30 ENCOUNTER — Ambulatory Visit (INDEPENDENT_AMBULATORY_CARE_PROVIDER_SITE_OTHER): Payer: Self-pay | Admitting: Family Medicine

## 2015-01-30 ENCOUNTER — Encounter (HOSPITAL_COMMUNITY): Payer: Self-pay

## 2015-01-30 ENCOUNTER — Ambulatory Visit (HOSPITAL_COMMUNITY)
Admission: RE | Admit: 2015-01-30 | Discharge: 2015-01-30 | Disposition: A | Discharge status: Home / Self Care | Payer: Medicaid Other | Source: Ambulatory Visit | Attending: Obstetrics & Gynecology | Admitting: Obstetrics & Gynecology

## 2015-01-30 VITALS — BP 123/76 | HR 91 | Temp 97.8°F | Wt 196.9 lb

## 2015-01-30 DIAGNOSIS — O24419 Gestational diabetes mellitus in pregnancy, unspecified control: Secondary | ICD-10-CM

## 2015-01-30 DIAGNOSIS — O4452 Low lying placenta with hemorrhage, second trimester: Secondary | ICD-10-CM

## 2015-01-30 DIAGNOSIS — Z3A36 36 weeks gestation of pregnancy: Secondary | ICD-10-CM | POA: Insufficient documentation

## 2015-01-30 DIAGNOSIS — O24414 Gestational diabetes mellitus in pregnancy, insulin controlled: Secondary | ICD-10-CM | POA: Insufficient documentation

## 2015-01-30 DIAGNOSIS — O0993 Supervision of high risk pregnancy, unspecified, third trimester: Secondary | ICD-10-CM

## 2015-01-30 DIAGNOSIS — O09523 Supervision of elderly multigravida, third trimester: Secondary | ICD-10-CM

## 2015-01-30 LAB — OB RESULTS CONSOLE GC/CHLAMYDIA
CHLAMYDIA, DNA PROBE: NEGATIVE
Gonorrhea: NEGATIVE

## 2015-01-30 LAB — POCT URINALYSIS DIP (DEVICE)
BILIRUBIN URINE: NEGATIVE
GLUCOSE, UA: NEGATIVE mg/dL
KETONES UR: NEGATIVE mg/dL
Nitrite: NEGATIVE
Protein, ur: 30 mg/dL — AB
Urobilinogen, UA: 0.2 mg/dL (ref 0.0–1.0)
pH: 6 (ref 5.0–8.0)

## 2015-01-30 LAB — OB RESULTS CONSOLE GBS: GBS: NEGATIVE

## 2015-01-30 NOTE — ED Notes (Signed)
[employer], spanish interpreter [phone].

## 2015-01-30 NOTE — Progress Notes (Signed)
Did not bring book, but reports blood sugars under control.  No side effects to the glyburide. Good fetal movement. GBS, GC/CT obtained. Recommended loratadine or cetirizine for allergies. Has not been seen since 3/7.  Has not had NSTs. Having US for growth today - will add on BPP.  NST twice weekly

## 2015-01-30 NOTE — Patient Instructions (Signed)
Tercer trimestre de embarazo (Third Trimester of Pregnancy) El tercer trimestre va desde la semana29 hasta la 42, desde el sptimo hasta el noveno mes, y es la poca en la que el feto crece ms rpidamente. Hacia el final del noveno mes, el feto mide alrededor de 20pulgadas (45cm) de largo y pesa entre 6 y 10 libras (2,700 y 4,500kg).  CAMBIOS EN EL ORGANISMO Su organismo atraviesa por muchos cambios durante el embarazo, y estos varan de una mujer a otra.   Seguir aumentando de peso. Es de esperar que aumente entre 25 y 35libras (11 y 16kg) hacia el final del embarazo.  Podrn aparecer las primeras estras en las caderas, el abdomen y las mamas.  Puede tener necesidad de orinar con ms frecuencia porque el feto baja hacia la pelvis y ejerce presin sobre la vejiga.  Debido al embarazo podr sentir acidez estomacal con frecuencia.  Puede estar estreida, ya que ciertas hormonas enlentecen los movimientos de los msculos que empujan los desechos a travs de los intestinos.  Pueden aparecer hemorroides o abultarse e hincharse las venas (venas varicosas).  Puede sentir dolor plvico debido al aumento de peso y a que las hormonas del embarazo relajan las articulaciones entre los huesos de la pelvis. El dolor de espalda puede ser consecuencia de la sobrecarga de los msculos que soportan la postura.  Tal vez haya cambios en el cabello que pueden incluir su engrosamiento, crecimiento rpido y cambios en la textura. Adems, a algunas mujeres se les cae el cabello durante o despus del embarazo, o tienen el cabello seco o fino. Lo ms probable es que el cabello se le normalice despus del nacimiento del beb.  Las mamas seguirn creciendo y le dolern. A veces, puede haber una secrecin amarilla de las mamas llamada calostro.  El ombligo puede salir hacia afuera.  Puede sentir que le falta el aire debido a que se expande el tero.  Puede notar que el feto "baja" o lo siente ms bajo, en el  abdomen.  Puede tener una prdida de secrecin mucosa con sangre. Esto suele ocurrir en el trmino de unos pocos das a una semana antes de que comience el trabajo de parto.  El cuello del tero se vuelve delgado y blando (se borra) cerca de la fecha de parto. QU DEBE ESPERAR EN LOS EXMENES PRENATALES  Le harn exmenes prenatales cada 2semanas hasta la semana36. A partir de ese momento le harn exmenes semanales. Durante una visita prenatal de rutina:  La pesarn para asegurarse de que usted y el feto estn creciendo normalmente.  Le tomarn la presin arterial.  Le medirn el abdomen para controlar el desarrollo del beb.  Se escucharn los latidos cardacos fetales.  Se evaluarn los resultados de los estudios solicitados en visitas anteriores.  Le revisarn el cuello del tero cuando est prxima la fecha de parto para controlar si este se ha borrado. Alrededor de la semana36, el mdico le revisar el cuello del tero. Al mismo tiempo, realizar un anlisis de las secreciones del tejido vaginal. Este examen es para determinar si hay un tipo de bacteria, estreptococo Grupo B. El mdico le explicar esto con ms detalle. El mdico puede preguntarle lo siguiente:  Cmo le gustara que fuera el parto.  Cmo se siente.  Si siente los movimientos del beb.  Si ha tenido sntomas anormales, como prdida de lquido, sangrado, dolores de cabeza intensos o clicos abdominales.  Si tiene alguna pregunta. Otros exmenes o estudios de deteccin que pueden realizarse   durante el tercer trimestre incluyen lo siguiente:  Anlisis de sangre para controlar las concentraciones de hierro (anemia).  Controles fetales para determinar su salud, nivel de actividad y crecimiento. Si tiene alguna enfermedad o hay problemas durante el embarazo, le harn estudios. FALSO TRABAJO DE PARTO Es posible que sienta contracciones leves e irregulares que finalmente desaparecen. Se llaman contracciones de  Braxton Hicks o falso trabajo de parto. Las contracciones pueden durar horas, das o incluso semanas, antes de que el verdadero trabajo de parto se inicie. Si las contracciones ocurren a intervalos regulares, se intensifican o se hacen dolorosas, lo mejor es que la revise el mdico.  SIGNOS DE TRABAJO DE PARTO   Clicos de tipo menstrual.  Contracciones cada 5minutos o menos.  Contracciones que comienzan en la parte superior del tero y se extienden hacia abajo, a la zona inferior del abdomen y la espalda.  Sensacin de mayor presin en la pelvis o dolor de espalda.  Una secrecin de mucosidad acuosa o con sangre que sale de la vagina. Si tiene alguno de estos signos antes de la semana37 del embarazo, llame a su mdico de inmediato. Debe concurrir al hospital para que la controlen inmediatamente. INSTRUCCIONES PARA EL CUIDADO EN EL HOGAR   Evite fumar, consumir hierbas, beber alcohol y tomar frmacos que no le hayan recetado. Estas sustancias qumicas afectan la formacin y el desarrollo del beb.  Siga las indicaciones del mdico en relacin con el uso de medicamentos. Durante el embarazo, hay medicamentos que son seguros de tomar y otros que no.  Haga actividad fsica solo en la forma indicada por el mdico. Sentir clicos uterinos es un buen signo para detener la actividad fsica.  Contine comiendo alimentos que sanos con regularidad.  Use un sostn que le brinde buen soporte si le duelen las mamas.  No se d baos de inmersin en agua caliente, baos turcos ni saunas.  Colquese el cinturn de seguridad cuando conduzca.  No coma carne cruda ni queso sin cocinar; evite el contacto con las bandejas sanitarias de los gatos y la tierra que estos animales usan. Estos elementos contienen grmenes que pueden causar defectos congnitos en el beb.  Tome las vitaminas prenatales.  Si est estreida, pruebe un laxante suave (si el mdico lo autoriza). Consuma ms alimentos ricos en  fibra, como vegetales y frutas frescos y cereales integrales. Beba gran cantidad de lquido para mantener la orina de tono claro o color amarillo plido.  Dese baos de asiento con agua tibia para aliviar el dolor o las molestias causadas por las hemorroides. Use una crema para las hemorroides si el mdico la autoriza.  Si tiene venas varicosas, use medias de descanso. Eleve los pies durante 15minutos, 3 o 4veces por da. Limite la cantidad de sal en su dieta.  Evite levantar objetos pesados, use zapatos de tacones bajos y mantenga una buena postura.  Descanse con las piernas elevadas si tiene calambres o dolor de cintura.  Visite a su dentista si no lo ha hecho durante el embarazo. Use un cepillo de dientes blando para higienizarse los dientes y psese el hilo dental con suavidad.  Puede seguir manteniendo relaciones sexuales, a menos que el mdico le indique lo contrario.  No haga viajes largos excepto que sea absolutamente necesario y solo con la autorizacin del mdico.  Tome clases prenatales para entender, practicar y hacer preguntas sobre el trabajo de parto y el parto.  Haga un ensayo de la partida al hospital.  Prepare el bolso que   llevar al hospital.  Prepare la habitacin del beb.  Concurra a todas las visitas prenatales segn las indicaciones de su mdico. SOLICITE ATENCIN MDICA SI:  No est segura de que est en trabajo de parto o de que ha roto la bolsa de las aguas.  Tiene mareos.  Siente clicos leves, presin en la pelvis o dolor persistente en el abdomen.  Tiene nuseas, vmitos o diarrea persistentes.  Tiene secrecin vaginal con mal olor.  Siente dolor al orinar. SOLICITE ATENCIN MDICA DE INMEDIATO SI:   Tiene fiebre.  Tiene una prdida de lquido por la vagina.  Tiene sangrado o pequeas prdidas vaginales.  Siente dolor intenso o clicos en el abdomen.  Sube o baja de peso rpidamente.  Tiene dificultad para respirar y siente dolor de  pecho.  Sbitamente se le hinchan mucho el rostro, las manos, los tobillos, los pies o las piernas.  No ha sentido los movimientos del beb durante una hora.  Siente un dolor de cabeza intenso que no se alivia con medicamentos.  Hay cambios en la visin. Document Released: 07/28/2005 Document Revised: 10/23/2013 [employer] Patient Information 2015 [employer]. This information is not intended to replace advice given to you by your health care provider. Make sure you discuss any questions you have with your health care provider.  

## 2015-01-30 NOTE — Progress Notes (Signed)
Used interpreter [employer]. C/o sinus headaches.Trace blood noted in urinalysis, small leukocytes

## 2015-01-31 LAB — GC/CHLAMYDIA PROBE AMP
CT PROBE, AMP APTIMA: NEGATIVE
GC PROBE AMP APTIMA: NEGATIVE

## 2015-02-01 LAB — CULTURE, BETA STREP (GROUP B ONLY)

## 2015-02-04 ENCOUNTER — Encounter: Payer: Self-pay | Admitting: Obstetrics and Gynecology

## 2015-02-04 ENCOUNTER — Other Ambulatory Visit: Payer: Self-pay

## 2015-02-07 ENCOUNTER — Ambulatory Visit (INDEPENDENT_AMBULATORY_CARE_PROVIDER_SITE_OTHER): Payer: Self-pay | Admitting: Family Medicine

## 2015-02-07 VITALS — BP 120/71 | HR 77

## 2015-02-07 DIAGNOSIS — O09893 Supervision of other high risk pregnancies, third trimester: Secondary | ICD-10-CM

## 2015-02-07 DIAGNOSIS — O24419 Gestational diabetes mellitus in pregnancy, unspecified control: Secondary | ICD-10-CM

## 2015-02-07 NOTE — Patient Instructions (Signed)
Safe Medications in Pregnancy   Colds/Coughs/Allergies: Cepacol throat lozenges Cough drops, alcohol free Mucinex Robitussin DM (plain only, alcohol free)

## 2015-02-07 NOTE — Progress Notes (Signed)
NST - reactive Discussed induction at 39 weeks - scheduled Blood sugars mostly controlled - 2 fastings elevated in 90s and 2 2hr PP elevated in the 120s Continue medication.

## 2015-02-07 NOTE — Progress Notes (Signed)
[name] present for encounter.  Pt states she is eating 4 times daily and takes her glyburide @ 0800 with breakfast and @ 4pm with her evening meal.

## 2015-02-11 ENCOUNTER — Encounter (HOSPITAL_COMMUNITY): Payer: Self-pay | Admitting: *Deleted

## 2015-02-11 ENCOUNTER — Ambulatory Visit (INDEPENDENT_AMBULATORY_CARE_PROVIDER_SITE_OTHER): Payer: Self-pay | Admitting: *Deleted

## 2015-02-11 ENCOUNTER — Telehealth (HOSPITAL_COMMUNITY): Payer: Self-pay | Admitting: *Deleted

## 2015-02-11 ENCOUNTER — Other Ambulatory Visit: Payer: Self-pay

## 2015-02-11 VITALS — BP 131/70 | HR 81

## 2015-02-11 DIAGNOSIS — O24419 Gestational diabetes mellitus in pregnancy, unspecified control: Secondary | ICD-10-CM

## 2015-02-11 NOTE — Progress Notes (Addendum)
Reactive NST - Baseline 130s.

## 2015-02-11 NOTE — Telephone Encounter (Signed)
Preadmission screen  

## 2015-02-11 NOTE — Progress Notes (Signed)
Interpreter - Darletta Mollorita Arias present for encounter

## 2015-02-12 NOTE — Telephone Encounter (Signed)
Interpreter number [phone]

## 2015-02-14 ENCOUNTER — Ambulatory Visit (INDEPENDENT_AMBULATORY_CARE_PROVIDER_SITE_OTHER): Payer: Self-pay | Admitting: Obstetrics and Gynecology

## 2015-02-14 ENCOUNTER — Encounter: Payer: Self-pay | Admitting: Obstetrics and Gynecology

## 2015-02-14 VITALS — BP 139/60 | HR 84 | Temp 97.7°F | Wt 198.9 lb

## 2015-02-14 DIAGNOSIS — O0993 Supervision of high risk pregnancy, unspecified, third trimester: Secondary | ICD-10-CM

## 2015-02-14 DIAGNOSIS — O09523 Supervision of elderly multigravida, third trimester: Secondary | ICD-10-CM

## 2015-02-14 DIAGNOSIS — O24419 Gestational diabetes mellitus in pregnancy, unspecified control: Secondary | ICD-10-CM

## 2015-02-14 DIAGNOSIS — Z789 Other specified health status: Secondary | ICD-10-CM

## 2015-02-14 NOTE — Progress Notes (Signed)
Patient is doing well. She reports feeling dizzy over the past few days particularly in the morning. CBGs all within range with some hypoglycemia in the morning noted over the past 3 days 48-60. Patient is taking glyburide 2.5 mg BID but is not consuming a bedtime snack. Reviewed the importance of a protein rich snack at bedtime. Patient is scheduled for IOL tomorrow. All questions were answered.

## 2015-02-14 NOTE — Addendum Note (Signed)
Addended by: Jill SideAY, Danil Wedge L on: 02/14/2015 11:44 AM   Modules accepted: Orders

## 2015-02-14 NOTE — Addendum Note (Signed)
Addended by: Jill SideAY, DIANE L on: 02/14/2015 12:02 PM   Modules accepted: Level of Service

## 2015-02-14 NOTE — Progress Notes (Signed)
Used Interpreter Kathleen Tucker. C/o decreased fetal movement x 2 days. Unable to void for specimen, given water to drink.

## 2015-02-15 ENCOUNTER — Encounter (HOSPITAL_COMMUNITY): Payer: Self-pay

## 2015-02-15 ENCOUNTER — Inpatient Hospital Stay (HOSPITAL_COMMUNITY)
Admission: RE | Admit: 2015-02-15 | Discharge: 2015-02-17 | DRG: 775 | Disposition: A | Discharge status: Home / Self Care | Payer: Medicaid Other | Source: Ambulatory Visit | Attending: Obstetrics & Gynecology | Admitting: Obstetrics & Gynecology

## 2015-02-15 DIAGNOSIS — Z833 Family history of diabetes mellitus: Secondary | ICD-10-CM | POA: Diagnosis not present

## 2015-02-15 DIAGNOSIS — O09523 Supervision of elderly multigravida, third trimester: Secondary | ICD-10-CM | POA: Diagnosis not present

## 2015-02-15 DIAGNOSIS — O9902 Anemia complicating childbirth: Secondary | ICD-10-CM | POA: Diagnosis present

## 2015-02-15 DIAGNOSIS — Z3A39 39 weeks gestation of pregnancy: Secondary | ICD-10-CM | POA: Diagnosis present

## 2015-02-15 DIAGNOSIS — O24419 Gestational diabetes mellitus in pregnancy, unspecified control: Secondary | ICD-10-CM | POA: Diagnosis present

## 2015-02-15 DIAGNOSIS — O24424 Gestational diabetes mellitus in childbirth, insulin controlled: Secondary | ICD-10-CM | POA: Diagnosis present

## 2015-02-15 DIAGNOSIS — O24429 Gestational diabetes mellitus in childbirth, unspecified control: Secondary | ICD-10-CM | POA: Diagnosis not present

## 2015-02-15 LAB — GLUCOSE, CAPILLARY
Glucose-Capillary: 52 mg/dL — ABNORMAL LOW (ref 70–99)
Glucose-Capillary: 60 mg/dL — ABNORMAL LOW (ref 70–99)

## 2015-02-15 LAB — CBC
HEMATOCRIT: 35.4 % — AB (ref 36.0–46.0)
Hemoglobin: 12 g/dL (ref 12.0–15.0)
MCH: 26.3 pg (ref 26.0–34.0)
MCHC: 33.9 g/dL (ref 30.0–36.0)
MCV: 77.6 fL — AB (ref 78.0–100.0)
Platelets: 168 10*3/uL (ref 150–400)
RBC: 4.56 MIL/uL (ref 3.87–5.11)
RDW: 14.7 % (ref 11.5–15.5)
WBC: 6.4 10*3/uL (ref 4.0–10.5)

## 2015-02-15 LAB — COMPREHENSIVE METABOLIC PANEL
ALK PHOS: 166 U/L — AB (ref 39–117)
ALT: 19 U/L (ref 0–35)
AST: 32 U/L (ref 0–37)
Albumin: 3.2 g/dL — ABNORMAL LOW (ref 3.5–5.2)
Anion gap: 8 (ref 5–15)
BUN: 12 mg/dL (ref 6–23)
CHLORIDE: 107 mmol/L (ref 96–112)
CO2: 22 mmol/L (ref 19–32)
Calcium: 9.1 mg/dL (ref 8.4–10.5)
Creatinine, Ser: 0.65 mg/dL (ref 0.50–1.10)
GFR calc Af Amer: >90 mL/min (ref >90)
GFR calc non Af Amer: >90 mL/min (ref >90)
Glucose, Bld: 72 mg/dL (ref 70–99)
Potassium: 3.8 mmol/L (ref 3.5–5.1)
Sodium: 137 mmol/L (ref 135–145)
TOTAL PROTEIN: 6.5 g/dL (ref 6.0–8.3)
Total Bilirubin: 0.8 mg/dL (ref 0.3–1.2)

## 2015-02-15 LAB — TYPE AND SCREEN
ABO/RH(D): B POS
Antibody Screen: NEGATIVE

## 2015-02-15 MED ORDER — LACTATED RINGERS IV SOLN
INTRAVENOUS | Status: DC
  Administered 2015-02-15: 21:00:00 via INTRAVENOUS

## 2015-02-15 MED ORDER — OXYCODONE-ACETAMINOPHEN 5-325 MG PO TABS: 2.0000 | ORAL_TABLET | ORAL | Status: DC | PRN

## 2015-02-15 MED ORDER — CITRIC ACID-SODIUM CITRATE 334-500 MG/5ML PO SOLN: 30.0000 mL | ORAL | Status: DC | PRN

## 2015-02-15 MED ORDER — LACTATED RINGERS IV SOLN: 500.0000 mL | INTRAVENOUS | Status: DC | PRN

## 2015-02-15 MED ORDER — ONDANSETRON HCL 4 MG/2ML IJ SOLN: 4.0000 mg | Freq: Four times a day (QID) | INTRAMUSCULAR | Status: DC | PRN

## 2015-02-15 MED ORDER — OXYCODONE-ACETAMINOPHEN 5-325 MG PO TABS
1.0000 | ORAL_TABLET | ORAL | Status: DC | PRN
  Administered 2015-02-16: 1 via ORAL
  Filled 2015-02-15: qty 1

## 2015-02-15 MED ORDER — OXYTOCIN 40 UNITS IN LACTATED RINGERS INFUSION - SIMPLE MED: 62.5000 mL/h | INTRAVENOUS | Status: DC

## 2015-02-15 MED ORDER — OXYTOCIN BOLUS FROM INFUSION: 500.0000 mL | INTRAVENOUS | Status: DC

## 2015-02-15 MED ORDER — LABETALOL HCL 5 MG/ML IV SOLN: 20.0000 mg | INTRAVENOUS | Status: DC | PRN

## 2015-02-15 MED ORDER — LIDOCAINE HCL (PF) 1 % IJ SOLN
30.0000 mL | INTRAMUSCULAR | Status: DC | PRN
  Filled 2015-02-15: qty 30

## 2015-02-15 MED ORDER — OXYTOCIN 40 UNITS IN LACTATED RINGERS INFUSION - SIMPLE MED
1.0000 m[IU]/min | INTRAVENOUS | Status: DC
  Administered 2015-02-15: 2 m[IU]/min via INTRAVENOUS
  Filled 2015-02-15: qty 1000

## 2015-02-15 MED ORDER — HYDRALAZINE HCL 20 MG/ML IJ SOLN: 10.0000 mg | Freq: Once | INTRAMUSCULAR | Status: AC | PRN

## 2015-02-15 MED ORDER — TERBUTALINE SULFATE 1 MG/ML IJ SOLN: 0.2500 mg | Freq: Once | INTRAMUSCULAR | Status: AC | PRN

## 2015-02-15 MED ORDER — ACETAMINOPHEN 325 MG PO TABS
650.0000 mg | ORAL_TABLET | ORAL | Status: DC | PRN
Start: 2015-02-15 — End: 2015-02-16

## 2015-02-15 NOTE — H&P (Signed)
LABOR ADMISSION HISTORY AND PHYSICAL  Barbie HaggisLaura Martinez-Ruiz is a 39 y.o. female [phone] with IUP at 5957w0d by LMP presenting for IOL. She reports +FMs.  Denies LOF, VB, blurry vision, headaches, peripheral edema, RUQ pain.  She plans on breast/bottle feeding. She request nexplanon for birth control.     Dating: By LMP --->  Estimated Date of Delivery: 02/22/15  Sono:    @[redacted]w[redacted]d , CWD, normal anatomy, cephalic presentation, anterior placenta, 2897g, 58% EFW   Prenatal History/Complications: [facility] for [facility] Prenatal Labs  Dating 9 week ultrasound consistent with LMP Blood type: --/--/B POS (09/16 1715)   Genetic Screen Quad: neg     Antibody: neg  Anatomic US Normal female anatomy Rubella:  immune  GTT Early: 104             Third trimester: 142 >  RPR:   NR  Flu vaccine 08/12/14 HBsAg:   Neg  TDaP vaccine 12/12/14                                  Rhogam: n/a HIV: NONREACTIVE (09/16 1715)   GBS  negative GBS:   Contraception  Pap:  Baby Food    Circumcision N/a, female anatomy   Pediatrician    Support Person     Past Medical History: Past Medical History  Diagnosis Date  . Diabetes mellitus without complication 2016    current/last pregnancy  . Anxiety     celexa  . Anemia   . AMA (advanced maternal age) multigravida 35+     Past Surgical History: History reviewed. No pertinent past surgical history.  Obstetrical History: OB History    Gravida Para Term Preterm AB TAB SAB Ectopic Multiple Living   6 4 4  1  1   4       Social History: History   Social History  . Marital Status: Married    Spouse Name: N/A  . Number of Children: N/A  . Years of Education: N/A   Social History Main Topics  . Smoking status: Never Smoker   . Smokeless tobacco: Not on file  . Alcohol Use: No  . Drug Use: No  . Sexual Activity: Yes    Birth Control/ Protection: Pill   Other Topics Concern  . None   Social History Narrative   ** Merged History Encounter  **        Family History: Family History  Problem Relation Age of Onset  . Diabetes Mother   . Diabetes Father     Allergies: No Known Allergies  Prescriptions prior to admission  Medication Sig Dispense Refill Last Dose  . acetaminophen (TYLENOL) 500 MG tablet Take 1,000 mg by mouth every 6 (six) hours as needed. For pain   Taking  . aspirin 81 MG tablet Take 1 tablet (81 mg total) by mouth daily. 30 tablet 3 Taking  . glyBURIDE (DIABETA) 2.5 MG tablet Take 1 tablet (2.5 mg total) by mouth 2 (two) times daily with a meal. 60 tablet 3 Taking  . Prenatal Vit-Fe Fumarate-FA (PRENATAL MULTIVITAMIN) TABS tablet Take 1 tablet by mouth at bedtime.    Taking    Review of Systems   All systems reviewed and negative except as stated in HPI  Blood pressure 146/88, pulse 95, temperature 97.9 F (36.6 C), temperature source Oral, resp. rate 20, height 5\' 4"  (1.626 m), weight 198 lb (89.812 kg), last menstrual  period 05/18/2014. General appearance: alert, cooperative, appears stated age and no distress Lungs: clear to auscultation bilaterally, no increased WOB Heart: regular rate and rhythm, no m/r/g Abdomen: soft, non-tender; bowel sounds normal Extremities: WWP, Homans sign is negative, no sign of DVT, +2 DP Neuro: patellar DTRs 2/4 Presentation: cephalic   Fetal monitoringBaseline: 141 bpm, Variability: Good {> 6 bpm) and Accelerations: Reactive Uterine activityFrequency: Every 5 minutes Dilation: 3 Effacement (%): 60 Station: -1 Exam by:: Auriel RN  Prenatal labs: ABO, Rh: B/Positive/-- (10/12 0000) Antibody: Negative (11/09 0000) Rubella:  IMMUNE RPR: Nonreactive (02/11 0000)  HBsAg: Negative (10/12 0000)  HIV: NONREACTIVE (09/16 1715)  GBS: Negative (03/31 0000)  Early Glucola 104; 3GTT: ABNORMAL Genetic screening  Quad neg Anatomy US normal  Prenatal Transfer Tool  Maternal Diabetes: Yes:  Diabetes Type:  Insulin/Medication controlled A2GDM Genetic Screening:  Normal Maternal Ultrasounds/Referrals: Normal Fetal Ultrasounds or other Referrals:  None Maternal Substance Abuse:  No Significant Maternal Medications:  Meds include: Other: Glyburide Significant Maternal Lab Results: Lab values include: Other: Elevated 3GTT  Results for orders placed or performed during the hospital encounter of 02/15/15 (from the past 24 hour(s))  CBC   Collection Time: 02/15/15  8:33 PM  Result Value Ref Range   WBC 6.4 4.0 - 10.5 K/uL   RBC 4.56 3.87 - 5.11 MIL/uL   Hemoglobin 12.0 12.0 - 15.0 g/dL   HCT 40.9 (L) 81.1 - 91.4 %   MCV 77.6 (L) 78.0 - 100.0 fL   MCH 26.3 26.0 - 34.0 pg   MCHC 33.9 30.0 - 36.0 g/dL   RDW 78.2 95.6 - 21.3 %   Platelets 168 150 - 400 K/uL    Patient Active Problem List   Diagnosis Date Noted  . Gestational diabetes 02/15/2015  . [redacted] weeks gestation of pregnancy   . Supervision of high risk pregnancy in third trimester 01/06/2015  . GDM (gestational diabetes mellitus) 01/06/2015  . History of pregnancy induced hypertension 01/06/2015  . Language barrier 01/06/2015  . 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 Anemia 01/06/2015  . AMA (advanced maternal age) multigravida 35+     Assessment: Renad Jenniges is a 39 y.o. [phone] at [redacted]w[redacted]d here for IOL 2/2 AMA w/ h/o A2GDM  #Labor: IOL, start pitocin #FWB: Cat 1 #ID:  GBS negative #MOF: Breast/Bottle #MOC: Nexplanon #Circ:  N/a FEMALE  Delynn Flavin M, DO 02/15/2015, 9:21 PM    OB fellow attestation:  I have seen and examined this patient; I agree with above documentation in the resident's note.   Camren Henthorn is a 39 y.o. [id number] here for IOL 2/2 A2DM on glyburide  PE: BP 126/70 mmHg  Pulse 97  Temp(Src) 98.2 F (36.8 C) (Oral)  Resp 18  Ht  (1.626 m)  Wt 198 lb (89.812 kg)  BMI 33.97 kg/m2  LMP 05/18/2014 Gen: calm comfortable, NAD Resp: normal effort, no distress Abd: gravid  ROS, labs, PMH reviewed  Plan: MOF: breast MOC: nexplanon ID: [id number]  neg FWB: cat I Labor: start pitocin, AROM when able Circ: n/a Pain: epidural upon request, declines A2DM: q2h CBGs, diet carb modified diet  Kennedy Bohanon ROCIO 02/16/2015, 3:24 AM

## 2015-02-16 ENCOUNTER — Encounter (HOSPITAL_COMMUNITY): Payer: Self-pay

## 2015-02-16 DIAGNOSIS — O09523 Supervision of elderly multigravida, third trimester: Secondary | ICD-10-CM

## 2015-02-16 DIAGNOSIS — O24429 Gestational diabetes mellitus in childbirth, unspecified control: Secondary | ICD-10-CM

## 2015-02-16 DIAGNOSIS — Z3A39 39 weeks gestation of pregnancy: Secondary | ICD-10-CM

## 2015-02-16 LAB — PROTEIN / CREATININE RATIO, URINE
CREATININE, URINE: 59 mg/dL
Protein Creatinine Ratio: 0.17 — ABNORMAL HIGH (ref 0.00–0.15)
Total Protein, Urine: 10 mg/dL

## 2015-02-16 LAB — RPR: RPR Ser Ql: NONREACTIVE

## 2015-02-16 LAB — GLUCOSE, CAPILLARY
GLUCOSE-CAPILLARY: 52 mg/dL — AB (ref 70–99)
GLUCOSE-CAPILLARY: 85 mg/dL (ref 70–99)
Glucose-Capillary: 138 mg/dL — ABNORMAL HIGH (ref 70–99)
Glucose-Capillary: 95 mg/dL (ref 70–99)
Glucose-Capillary: 130 mg/dL — ABNORMAL HIGH (ref 70–99)

## 2015-02-16 LAB — HIV ANTIBODY (ROUTINE TESTING W REFLEX): HIV Screen 4th Generation wRfx: NONREACTIVE

## 2015-02-16 MED ORDER — FENTANYL 2.5 MCG/ML BUPIVACAINE 1/10 % EPIDURAL INFUSION (WH - ANES): 14.0000 mL/h | INTRAMUSCULAR | Status: DC | PRN

## 2015-02-16 MED ORDER — SENNOSIDES-DOCUSATE SODIUM 8.6-50 MG PO TABS
2.0000 | ORAL_TABLET | ORAL | Status: DC
  Administered 2015-02-16: 2 via ORAL
  Filled 2015-02-16: qty 2

## 2015-02-16 MED ORDER — SODIUM CHLORIDE 0.9 % IJ SOLN: 3.0000 mL | INTRAMUSCULAR | Status: DC | PRN

## 2015-02-16 MED ORDER — EPHEDRINE 5 MG/ML INJ
10.0000 mg | INTRAVENOUS | Status: DC | PRN
  Filled 2015-02-16: qty 2

## 2015-02-16 MED ORDER — BISACODYL 10 MG RE SUPP: 10.0000 mg | Freq: Every day | RECTAL | Status: DC | PRN

## 2015-02-16 MED ORDER — BENZOCAINE-MENTHOL 20-0.5 % EX AERO: 1.0000 "application " | INHALATION_SPRAY | CUTANEOUS | Status: DC | PRN

## 2015-02-16 MED ORDER — ONDANSETRON HCL 4 MG/2ML IJ SOLN: 4.0000 mg | INTRAMUSCULAR | Status: DC | PRN

## 2015-02-16 MED ORDER — SODIUM CHLORIDE 0.9 % IJ SOLN: 3.0000 mL | Freq: Two times a day (BID) | INTRAMUSCULAR | Status: DC

## 2015-02-16 MED ORDER — SODIUM CHLORIDE 0.9 % IV SOLN: 250.0000 mL | INTRAVENOUS | Status: DC | PRN

## 2015-02-16 MED ORDER — OXYCODONE-ACETAMINOPHEN 5-325 MG PO TABS: 1.0000 | ORAL_TABLET | ORAL | Status: DC | PRN

## 2015-02-16 MED ORDER — LANOLIN HYDROUS EX OINT: TOPICAL_OINTMENT | CUTANEOUS | Status: DC | PRN

## 2015-02-16 MED ORDER — ACETAMINOPHEN 325 MG PO TABS: 650.0000 mg | ORAL_TABLET | ORAL | Status: DC | PRN

## 2015-02-16 MED ORDER — DIPHENHYDRAMINE HCL 50 MG/ML IJ SOLN: 12.5000 mg | INTRAMUSCULAR | Status: DC | PRN

## 2015-02-16 MED ORDER — FLEET ENEMA 7-19 GM/118ML RE ENEM: 1.0000 | ENEMA | Freq: Every day | RECTAL | Status: DC | PRN

## 2015-02-16 MED ORDER — OXYTOCIN 40 UNITS IN LACTATED RINGERS INFUSION - SIMPLE MED: 62.5000 mL/h | INTRAVENOUS | Status: DC | PRN

## 2015-02-16 MED ORDER — EPHEDRINE 5 MG/ML INJ
10.0000 mg | INTRAVENOUS | Status: DC | PRN
Start: 2015-02-16 — End: 2015-02-16
  Filled 2015-02-16: qty 2

## 2015-02-16 MED ORDER — SIMETHICONE 80 MG PO CHEW: 80.0000 mg | CHEWABLE_TABLET | ORAL | Status: DC | PRN

## 2015-02-16 MED ORDER — LACTATED RINGERS IV SOLN: 500.0000 mL | Freq: Once | INTRAVENOUS | Status: DC

## 2015-02-16 MED ORDER — DIBUCAINE 1 % RE OINT: 1.0000 "application " | TOPICAL_OINTMENT | RECTAL | Status: DC | PRN

## 2015-02-16 MED ORDER — IBUPROFEN 600 MG PO TABS
600.0000 mg | ORAL_TABLET | Freq: Four times a day (QID) | ORAL | Status: DC
  Administered 2015-02-16 – 2015-02-17 (×6): 600 mg via ORAL
  Filled 2015-02-16 (×6): qty 1

## 2015-02-16 MED ORDER — PRENATAL MULTIVITAMIN CH
1.0000 | ORAL_TABLET | Freq: Every day | ORAL | Status: DC
  Administered 2015-02-16 – 2015-02-17 (×2): 1 via ORAL
  Filled 2015-02-16 (×2): qty 1

## 2015-02-16 MED ORDER — PHENYLEPHRINE 40 MCG/ML (10ML) SYRINGE FOR IV PUSH (FOR BLOOD PRESSURE SUPPORT)
80.0000 ug | PREFILLED_SYRINGE | INTRAVENOUS | Status: DC | PRN
  Filled 2015-02-16: qty 2

## 2015-02-16 MED ORDER — DIPHENHYDRAMINE HCL 25 MG PO CAPS: 25.0000 mg | ORAL_CAPSULE | Freq: Four times a day (QID) | ORAL | Status: DC | PRN

## 2015-02-16 MED ORDER — ZOLPIDEM TARTRATE 5 MG PO TABS: 5.0000 mg | ORAL_TABLET | Freq: Every evening | ORAL | Status: DC | PRN

## 2015-02-16 MED ORDER — WITCH HAZEL-GLYCERIN EX PADS: 1.0000 "application " | MEDICATED_PAD | CUTANEOUS | Status: DC | PRN

## 2015-02-16 MED ORDER — ONDANSETRON HCL 4 MG PO TABS: 4.0000 mg | ORAL_TABLET | ORAL | Status: DC | PRN

## 2015-02-16 NOTE — Lactation Note (Signed)
This note was copied from the chart of Kathleen Tucker. Lactation Consultation Note Attempted visit at 17 hours of age.  Mom denies concerns or need for assist at this time.   Patient Name: Kathleen Tucker KVQQV'ZToday's Date: 02/16/2015     Maternal Data    Feeding    LATCH Score/Interventions                      Lactation Tools Discussed/Used     Consult Status      [name], [name] 02/16/2015, 10:08 PM

## 2015-02-16 NOTE — Lactation Note (Signed)
This note was copied from the chart of Kathleen Tucker. Lactation Consultation Note  Patient Name: Kathleen Tucker DGUYQ'IToday's Date: 02/16/2015 Reason for consult: Initial assessment   With this mom of a term baby, now 7 hours old. Mom repoerts she plans to both breast and formula feed, and has already fed 12 mls of formula by bottle to the baby. Mom is an experienced breastfeeder, and denies needing any lactation help at this time. Mom knows to ask for lactation with questions/cocnerns.    Maternal Data Formula Feeding for Exclusion: Yes Reason for exclusion: Mother's choice to formula and breast feed on admission Has patient been taught Hand Expression?: No Does the patient have breastfeeding experience prior to this delivery?: Yes  Feeding Feeding Type: Bottle Fed - Formula Length of feed: 15 min  LATCH Score/Interventions                      Lactation Tools Discussed/Used     Consult Status Consult Status: PRN Follow-up type: Call as needed    Alfred LevinsLee, Marisol Giambra Anne 02/16/2015, 10:18 AM

## 2015-02-16 NOTE — Progress Notes (Signed)
Checked on patients needs and ordered dinner,  snack and breakfast for patient.  Spanish Interpreter

## 2015-02-16 NOTE — Progress Notes (Signed)
Kathleen Tucker is a 39 y.o. [id number] at 3123w1d by LMP admitted for induction of labor due to Gestational diabetes and AMA.  Subjective: Patient reports that she is feeling more pressure.  She declines pain medication at this time but is open to an epidural when the pain increases.  Objective: BP 125/75 mmHg  Pulse 87  Temp(Src) 98.2 F (36.8 C) (Oral)  Resp 18  Ht 5\' 4"  (1.626 m)  Wt 198 lb (89.812 kg)  BMI 33.97 kg/m2  LMP 05/18/2014     FHT:  FHR: 125 bpm, variability: moderate,  accelerations:  Present,  decelerations:  Absent UC:   regular, every 2-5 minutes SVE:   Dilation: 3 Effacement (%): 60 Station: -1 Exam by:: Dr. Harlene RamusA. Gottschalk  Labs: Lab Results  Component Value Date   WBC 6.4 02/15/2015   HGB 12.0 02/15/2015   HCT 35.4* 02/15/2015   MCV 77.6* 02/15/2015   PLT 168 02/15/2015    Assessment / Plan: Induction of labor due to gestational diabetes and AMA,  progressing well on pitocin  Labor: Progressing on Pitocin, will continue to increase then AROM Fetal Wellbeing:  Category I Pain Control:  Labor support without medications I/D:  n/a Anticipated MOD:  NSVD  Raliegh IpGottschalk, Ashly M, DO 02/16/2015, 1:00 AM

## 2015-02-16 NOTE — Progress Notes (Signed)
Assisted RN with interpretation of administration of vaccination. Spanish Interpreter

## 2015-02-16 NOTE — Progress Notes (Signed)
Checked on patients needs.  °Spanish Interpreter  °

## 2015-02-16 NOTE — Progress Notes (Signed)
Assisted Rn with patient admissions to unit. Marland Kitchen.  Spanish Interpreter

## 2015-02-16 NOTE — Progress Notes (Signed)
Assisted RN with interpretation of administration of medication. Spanish Interpreter

## 2015-02-16 NOTE — Progress Notes (Signed)
Performed CBG at 0800 with a result of 138. CBG did not enter into results review

## 2015-02-16 NOTE — Progress Notes (Signed)
Assisted RN with interpretation of IV administration.  Spanish Interpreter

## 2015-02-17 LAB — GLUCOSE, CAPILLARY: GLUCOSE-CAPILLARY: 114 mg/dL — AB (ref 70–99)

## 2015-02-17 MED ORDER — OXYCODONE-ACETAMINOPHEN 5-325 MG PO TABS: 1.0000 | ORAL_TABLET | ORAL | Status: DC | PRN

## 2015-02-17 MED ORDER — IBUPROFEN 600 MG PO TABS: 600.0000 mg | ORAL_TABLET | Freq: Four times a day (QID) | ORAL | Status: DC

## 2015-02-17 NOTE — Discharge Summary (Signed)
Obstetric Discharge Summary Reason for Admission: induction of labor   Kathleen Tucker is a 39 y.o. [id number] at 11047w0d here for IOL 2/2 AMA w/ h/o A2GDM  Prenatal Procedures: ultrasound Intrapartum Procedures: spontaneous vaginal delivery Postpartum Procedures: none Complications-Operative and Postpartum: none HEMOGLOBIN  Date Value Ref Range Status  02/15/2015 12.0 12.0 - 15.0 g/dL Final  14/78/295602/09/2015 21.310.7 g/dL Final   HCT  Date Value Ref Range Status  02/15/2015 35.4* 36.0 - 46.0 % Final  12/12/2014 34 % Final    Physical Exam:  General: alert, cooperative, appears stated age and no distress Lochia: appropriate Uterine Fundus: firm Incision: n/a DVT Evaluation: No evidence of DVT seen on physical exam. Negative Homan's sign. No cords or calf tenderness.  Discharge Diagnoses: Term Pregnancy-delivered and GDM AMA  Discharge Information: Date: 02/17/2015 Activity: pelvic rest Diet: routine Medications: PNV, Ibuprofen and Percocet Condition: stable and improved Instructions: refer to practice specific booklet Discharge to: home   Newborn Data: Live born female  Birth Weight: 7 lb 8.1 oz (3405 g) APGAR: 8, 9  Home with mother.  Kathleen Tucker, Kathleen Tucker 02/17/2015, 7:27 AM

## 2015-02-17 NOTE — Progress Notes (Signed)
4/15 NST reviewed and reactive

## 2015-02-17 NOTE — Lactation Note (Signed)
This note was copied from the chart of Kathleen Tucker. Lactation Consultation Note  Patient Name: Kathleen Tucker ZOXWR'UToday's Date: 02/17/2015 Reason for consult: Follow-up assessment Baby 32 hours of life. [facility] Interpreter "Bonita QuinLinda" [phone], discussed BF with mom. Mom states that she nursed and formula-fed her 4 older children and always had issues with breast milk supply. Discussed supply and demand with mom and the need to put baby to breast with each feeding, then supplement as needed with formula. Mom states that this is what she wants to do for baby as she would like to nurse this baby more than her others. Mom aware of OP/BFSG and LC phone line assistance after D/C. Assisted mom to latch baby to breast in football position and baby latched deeply, suckling rhythmically with intermittent swallows noted. Mom states that she has no other questions.   Maternal Data    Feeding Feeding Type: Breast Fed Length of feed:  (LC assessed first 15 minutes of BF.)  LATCH Score/Interventions Latch: Grasps breast easily, tongue down, lips flanged, rhythmical sucking. Intervention(s): Adjust position;Assist with latch;Breast compression  Audible Swallowing: A few with stimulation Intervention(s): Skin to skin;Hand expression  Type of Nipple: Everted at rest and after stimulation  Comfort (Breast/Nipple): Soft / non-tender     Hold (Positioning): Assistance needed to correctly position infant at breast and maintain latch.  LATCH Score: 8  Lactation Tools Discussed/Used     Consult Status Consult Status: Complete    Geralynn OchsWILLIARD, Alexina Niccoli 02/17/2015, 11:51 AM

## 2015-02-19 ENCOUNTER — Encounter: Payer: Self-pay | Admitting: General Practice

## 2015-03-26 ENCOUNTER — Ambulatory Visit (INDEPENDENT_AMBULATORY_CARE_PROVIDER_SITE_OTHER): Payer: Self-pay | Admitting: Obstetrics and Gynecology

## 2015-03-26 ENCOUNTER — Encounter: Payer: Self-pay | Admitting: Obstetrics and Gynecology

## 2015-03-26 NOTE — Progress Notes (Signed)
Interpreter Nile RiggsMariel Gallego present for encounter.  Pt states she will use condoms until appt @ [facility] for Nexplanon insertion.

## 2015-03-26 NOTE — Progress Notes (Signed)
  Subjective:     Kathleen Tucker is a 39 y.o. female [phone] who presents for a postpartum visit. She is 5 weeks postpartum following a spontaneous vaginal delivery. I have fully reviewed the prenatal and intrapartum course. Significant for A2GDM. The delivery was at 39.1 gestational weeks. Outcome: spontaneous vaginal delivery. Anesthesia: epidural. Postpartum course has been uncomplicated. Baby's course has been uncomplicated. Baby is feeding by breast and bottle. Has been to Pediatrician, baby thriving. . Bleeding staining only. Bowel function is normal. Bladder function is normal. Patient is not sexually active. Contraception method is abstinence. Postpartum depression screening: negative.  The following portions of the patient's history were reviewed and updated as appropriate: allergies, current medications, past family history, past medical history, past social history, past surgical history and problem list.  Review of Systems Pertinent items are noted in HPI.   Objective:    BP 121/73 mmHg  Pulse 69  Temp(Src) 97.9 F (36.6 C) (Oral)  Resp 20  Ht 5\' 4"  (1.626 m)  Wt 176 lb 12.8 oz (80.196 kg)  BMI 30.33 kg/m2  Breastfeeding? Yes  General:  alert, cooperative and no distress   Breasts:  inspection negative, no nipple discharge or bleeding, no masses or nodularity palpable  Lungs: clear to auscultation bilaterally  Heart:  regular rate and rhythm, S1, S2 normal, no murmur, click, rub or gallop  Abdomen: soft, non-tender; bowel sounds normal; no masses,  no organomegaly   Vulva:  not evaluated  Vagina: not evaluated  Cervix:  not evaluated  Corpus: not examined  Adnexa:  not evaluated  Rectal Exam:  not done        Assessment:     5 wk postpartum exam. Pap smear not done at today's visit.   Plan:    1. Contraception: Nexplanon Has appt at [facility] 04/26/15. Advised condoms, then abstain for 2 wk prior to appt. 2. Return for 2 hr OGTT in 1-2 wks 3. Follow up in: 6 months   For Pap or as needed.   Continue PNV x 6 wk

## 2015-03-26 NOTE — Patient Instructions (Signed)
Etonogestrel implant What is this medicine? ETONOGESTREL (et oh noe JES trel) is a contraceptive (birth control) device. It is used to prevent pregnancy. It can be used for up to 3 years. This medicine may be used for other purposes; ask your health care provider or pharmacist if you have questions. COMMON BRAND NAME(S): Implanon, Nexplanon What should I tell my health care provider before I take this medicine? They need to know if you have any of these conditions: -abnormal vaginal bleeding -blood vessel disease or blood clots -cancer of the breast, cervix, or liver -depression -diabetes -gallbladder disease -headaches -heart disease or recent heart attack -high blood pressure -high cholesterol -kidney disease -liver disease -renal disease -seizures -tobacco smoker -an unusual or allergic reaction to etonogestrel, other hormones, anesthetics or antiseptics, medicines, foods, dyes, or preservatives -pregnant or trying to get pregnant -breast-feeding How should I use this medicine? This device is inserted just under the skin on the inner side of your upper arm by a health care professional. Talk to your pediatrician regarding the use of this medicine in children. Special care may be needed. Overdosage: If you think you've taken too much of this medicine contact a poison control center or emergency room at once. Overdosage: If you think you have taken too much of this medicine contact a poison control center or emergency room at once. NOTE: This medicine is only for you. Do not share this medicine with others. What if I miss a dose? This does not apply. What may interact with this medicine? Do not take this medicine with any of the following medications: -amprenavir -bosentan -fosamprenavir This medicine may also interact with the following medications: -barbiturate medicines for inducing sleep or treating seizures -certain medicines for fungal infections like ketoconazole and  itraconazole -griseofulvin -medicines to treat seizures like carbamazepine, felbamate, oxcarbazepine, phenytoin, topiramate -modafinil -phenylbutazone -rifampin -some medicines to treat HIV infection like atazanavir, indinavir, lopinavir, nelfinavir, tipranavir, ritonavir -St. John's wort This list may not describe all possible interactions. Give your health care provider a list of all the medicines, herbs, non-prescription drugs, or dietary supplements you use. Also tell them if you smoke, drink alcohol, or use illegal drugs. Some items may interact with your medicine. What should I watch for while using this medicine? This product does not protect you against HIV infection (AIDS) or other sexually transmitted diseases. You should be able to feel the implant by pressing your fingertips over the skin where it was inserted. Tell your doctor if you cannot feel the implant. What side effects may I notice from receiving this medicine? Side effects that you should report to your doctor or health care professional as soon as possible: -allergic reactions like skin rash, itching or hives, swelling of the face, lips, or tongue -breast lumps -changes in vision -confusion, trouble speaking or understanding -dark urine -depressed mood -general ill feeling or flu-like symptoms -light-colored stools -loss of appetite, nausea -right upper belly pain -severe headaches -severe pain, swelling, or tenderness in the abdomen -shortness of breath, chest pain, swelling in a leg -signs of pregnancy -sudden numbness or weakness of the face, arm or leg -trouble walking, dizziness, loss of balance or coordination -unusual vaginal bleeding, discharge -unusually weak or tired -yellowing of the eyes or skin Side effects that usually do not require medical attention (Report these to your doctor or health care professional if they continue or are bothersome.): -acne -breast pain -changes in  weight -cough -fever or chills -headache -irregular menstrual bleeding -itching, burning, and   vaginal discharge -pain or difficulty passing urine -sore throat This list may not describe all possible side effects. Call your doctor for medical advice about side effects. You may report side effects to FDA at 1-800-FDA-1088. Where should I keep my medicine? This drug is given in a hospital or clinic and will not be stored at home. NOTE: This sheet is a summary. It may not cover all possible information. If you have questions about this medicine, talk to your doctor, pharmacist, or health care provider.  2015, Elsevier/Gold Standard. (2012-04-24 15:37:45)  

## 2015-04-04 ENCOUNTER — Other Ambulatory Visit: Payer: Self-pay

## 2015-04-04 DIAGNOSIS — R7309 Other abnormal glucose: Secondary | ICD-10-CM

## 2015-04-05 LAB — GLUCOSE TOLERANCE, 2 HOURS
GLUCOSE, FASTING: 85 mg/dL (ref 70–99)
Glucose, 2 hour: 88 mg/dL (ref 70–139)

## 2015-04-07 ENCOUNTER — Telehealth: Payer: Self-pay

## 2015-04-07 NOTE — Telephone Encounter (Signed)
-----   Message from Levie HeritageJacob J Stinson, DO sent at 04/05/2015  9:00 AM EDT ----- 2hr GTT okay.  No need for further testing now.

## 2015-04-07 NOTE — Telephone Encounter (Signed)
Called patient with spanish interpreter Kathleen Tucker and informed her of normal PP 2hr gtt-- no longer has gestational diabetes. Patient verbalized understanding and gratitude. No questions or concerns.

## 2016-06-05 ENCOUNTER — Encounter (HOSPITAL_COMMUNITY): Payer: Self-pay | Admitting: Emergency Medicine

## 2016-06-05 ENCOUNTER — Emergency Department (HOSPITAL_COMMUNITY)
Admission: EM | Admit: 2016-06-05 | Discharge: 2016-06-05 | Disposition: A | Discharge status: Home / Self Care | Payer: Self-pay | Attending: Emergency Medicine | Admitting: Emergency Medicine

## 2016-06-05 DIAGNOSIS — E119 Type 2 diabetes mellitus without complications: Secondary | ICD-10-CM | POA: Insufficient documentation

## 2016-06-05 DIAGNOSIS — J020 Streptococcal pharyngitis: Secondary | ICD-10-CM | POA: Insufficient documentation

## 2016-06-05 LAB — RAPID STREP SCREEN (MED CTR MEBANE ONLY): Streptococcus, Group A Screen (Direct): POSITIVE — AB

## 2016-06-05 MED ORDER — ACETAMINOPHEN 160 MG/5ML PO SOLN
650.0000 mg | Freq: Once | ORAL | Status: AC
  Administered 2016-06-05: 650 mg via ORAL

## 2016-06-05 MED ORDER — ACETAMINOPHEN 160 MG/5ML PO SOLN
ORAL | Status: AC
  Filled 2016-06-05: qty 20.3

## 2016-06-05 MED ORDER — DEXAMETHASONE 1 MG/ML PO CONC
10.0000 mg | Freq: Once | ORAL | Status: AC
  Administered 2016-06-05: 10 mg via ORAL
  Filled 2016-06-05: qty 10

## 2016-06-05 MED ORDER — PENICILLIN G BENZATHINE & PROC 1200000 UNIT/2ML IM SUSP
1.2000 10*6.[IU] | Freq: Once | INTRAMUSCULAR | Status: AC
  Administered 2016-06-05: 1.2 10*6.[IU] via INTRAMUSCULAR
  Filled 2016-06-05: qty 2

## 2016-06-05 NOTE — ED Triage Notes (Signed)
Pt to ED with c/o sore throat x 4 days-- voice muffled, tonsils red/enlarged --

## 2016-06-05 NOTE — ED Provider Notes (Signed)
[facility]PT Provider Note   CSN: [id number] Arrival date & time: 06/05/16  1326  First Provider Contact:  None       History   Chief Complaint Chief Complaint  Patient presents with  . Sore Throat  . Fever    HPI Kathleen Tucker is a 40 y.o. female who presents with a HA and sore throat. History is obtained through [place] interpreter. PMH significant for anemia, anxiety, DM. She states about 4 days ago she started to have a HA and sore throat. Associated with fever, chills, muscle aches, dysphagia, decreased PO intake. Denies chest pain, SOB, cough, abdominal pain, N/V/D, dysuria. She has been taking Ibuprofen with some relief.   HPI  Past Medical History:  Diagnosis Date  . AMA (advanced maternal age) multigravida 35+   . Anemia   . Anxiety    celexa  . Diabetes mellitus without complication (HCC) 2016   current/last pregnancy    Patient Active Problem List   Diagnosis Date Noted  . NSVD (normal spontaneous vaginal delivery) 02/16/2015  . Gestational diabetes 02/15/2015  . [redacted] weeks gestation of pregnancy   . Supervision of high risk pregnancy in third trimester 01/06/2015  . GDM (gestational diabetes mellitus) 01/06/2015  . History of pregnancy induced hypertension 01/06/2015  . Language barrier 01/06/2015  . 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 Anemia 01/06/2015  . AMA (advanced maternal age) multigravida 35+     History reviewed. No pertinent surgical history.  OB History    Gravida Para Term Preterm AB Living   6 5 5   1 5    SAB TAB Ectopic Multiple Live Births   1     0 5       Home Medications    Prior to Admission medications   Medication Sig Start Date End Date Taking? Authorizing Provider  ibuprofen (ADVIL,MOTRIN) 600 MG tablet Take 1 tablet (600 mg total) by mouth every 6 (six) hours. Patient not taking: Reported on 03/26/2015 02/17/15   Montez Morita, CNM  oxyCODONE-acetaminophen (PERCOCET/ROXICET) 5-325 MG per tablet Take 1 tablet by  mouth every 4 (four) hours as needed (for pain scale 4-7). Patient not taking: Reported on 03/26/2015 02/17/15   Montez Morita, CNM  Prenatal Vit-Fe Fumarate-FA (PRENATAL MULTIVITAMIN) TABS tablet Take 1 tablet by mouth at bedtime.     Historical Provider, MD    Family History Family History  Problem Relation Age of Onset  . Diabetes Mother   . Diabetes Father     Social History Social History  Substance Use Topics  . Smoking status: Never Smoker  . Smokeless tobacco: Never Used  . Alcohol use No     Allergies   Review of patient's allergies indicates no known allergies.   Review of Systems Review of Systems  Constitutional: Positive for appetite change, chills and fever.  HENT: Positive for sore throat and trouble swallowing.   Respiratory: Negative for cough and shortness of breath.   Cardiovascular: Negative for chest pain.  Gastrointestinal: Negative for abdominal pain, nausea and vomiting.  All other systems reviewed and are negative.    Physical Exam Updated Vital Signs BP 129/82   Pulse 102   Temp 99.7 F (37.6 C) (Oral)   Resp 15   Wt 81.2 kg   SpO2 99%   BMI 30.73 kg/m   Physical Exam  Constitutional: She is oriented to person, place, and time. She appears well-developed and well-nourished. She appears ill. No distress.  HENT:  Head: Normocephalic and atraumatic.  Mouth/Throat: Uvula is midline and mucous membranes are normal. No trismus in the jaw. No uvula swelling. Posterior oropharyngeal edema and posterior oropharyngeal erythema present. No oropharyngeal exudate or tonsillar abscesses.  Eyes: Conjunctivae are normal. Pupils are equal, round, and reactive to light. Right eye exhibits no discharge. Left eye exhibits no discharge. No scleral icterus.  Neck: Normal range of motion. Neck supple.  Cardiovascular: Regular rhythm.  Tachycardia present.  Exam reveals no gallop and no friction rub.   No murmur heard. Pulmonary/Chest: Effort normal and  breath sounds normal. No respiratory distress. She has no wheezes. She has no rales. She exhibits no tenderness.  Abdominal: Soft. She exhibits no distension. There is no tenderness.  Musculoskeletal: She exhibits no edema.  Neurological: She is alert and oriented to person, place, and time.  Skin: Skin is warm and dry.  Psychiatric: She has a normal mood and affect. Her behavior is normal.  Nursing note and vitals reviewed.    ED Treatments / Results  Labs (all labs ordered are listed, but only abnormal results are displayed) Labs Reviewed  RAPID STREP SCREEN (NOT AT [facility]) - Abnormal; Notable for the following:       Result Value   Streptococcus, Group A Screen (Direct) POSITIVE (*)    All other components within normal limits    EKG  EKG Interpretation None       Radiology No results found.  Procedures Procedures (including critical care time)  Medications Ordered in ED Medications  acetaminophen (TYLENOL) 160 MG/5ML solution (not administered)  penicillin g procaine-penicillin g benzathine (BICILLIN-CR) injection 600000-600000 units (not administered)  dexamethasone (DECADRON) 1 MG/ML solution 10 mg (not administered)  acetaminophen (TYLENOL) solution 650 mg (650 mg Oral Given 06/05/16 1356)     Initial Impression / Assessment and Plan / ED Course  I have reviewed the triage vital signs and the nursing notes.  Pertinent labs & imaging results that were available during my care of the patient were reviewed by me and considered in my medical decision making (see chart for details).  Clinical Course   40 year old female presents with sore throat. Rapid strep is positive. She is febrile here in ED. Tylenol given which has improved temp. She is tachycardic as well - most likely due to fever and decreased PO intake. Will given PCN IM and decadron. Recommend supportive care and plenty of fluids at home. Patient is NAD, non-toxic, with stable VS. Patient is informed of  clinical course, understands medical decision making process, and agrees with plan. Opportunity for questions provided and all questions answered. Return precautions given.   Final Clinical Impressions(s) / ED Diagnoses   Final diagnoses:  Strep pharyngitis    New Prescriptions New Prescriptions   No medications on file     Bethel Born, PA-C 06/05/16 1648    Geoffery Lyons, MD 06/05/16 [phone]

## 2016-06-05 NOTE — ED Notes (Signed)
EDP at bedside  

## 2016-11-09 IMAGING — US US OB FOLLOW-UP
1 series · 12 of 28 positions shown · non-contrast
Comparison: none

[Series 1: us ob follow-up · 0.28mm/px · 12 of 39 slices shown]
[im 2/39]
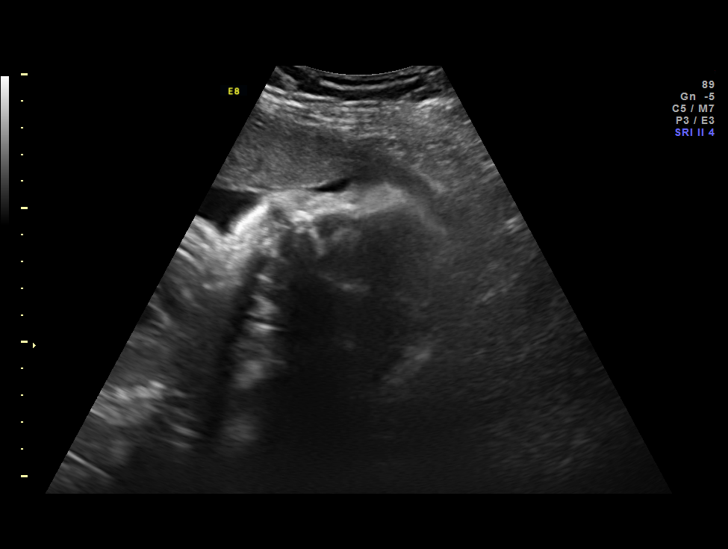
[im 5/39]
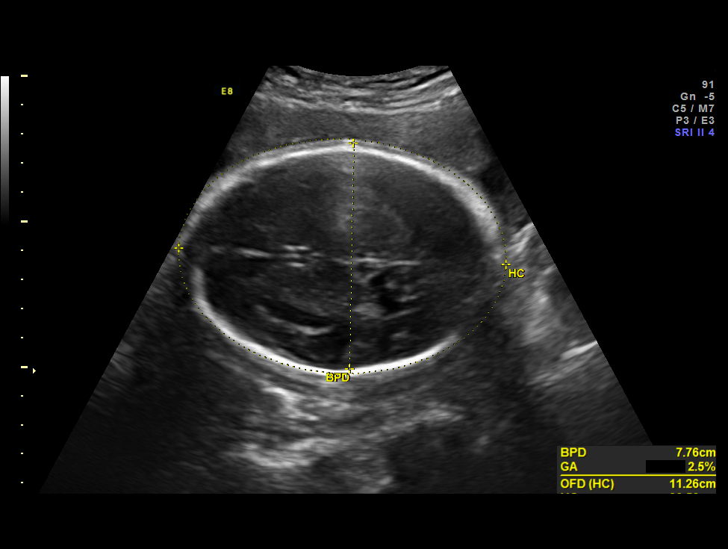
[im 8/39]
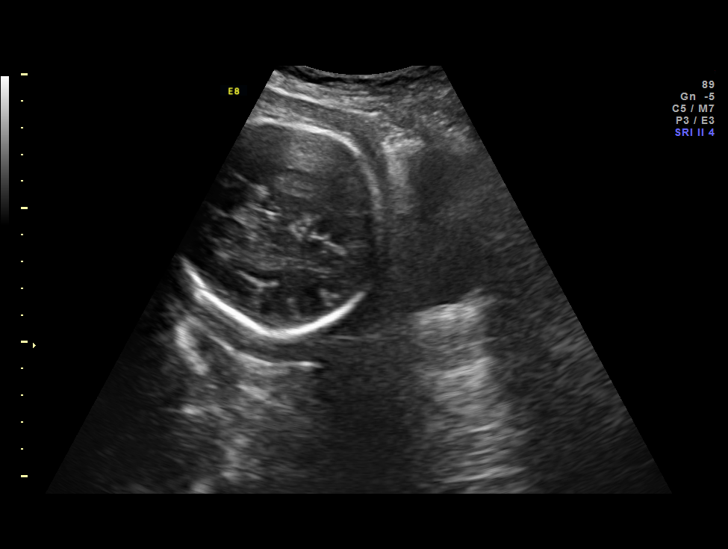
[im 12/39]
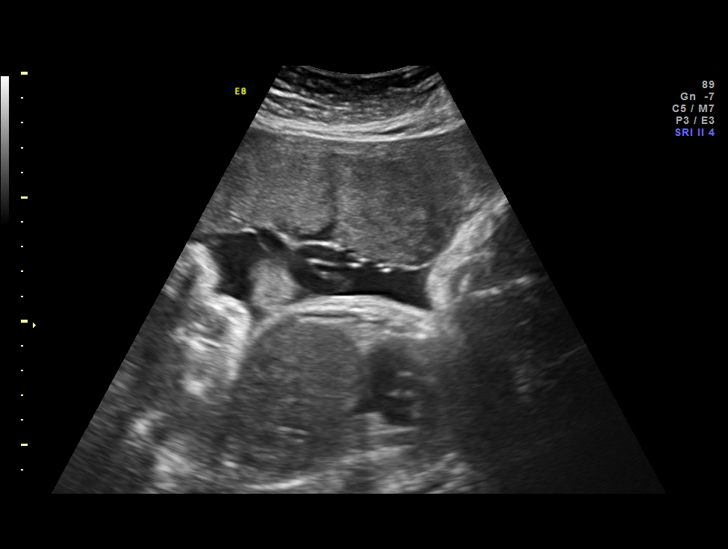
[im 15/39]
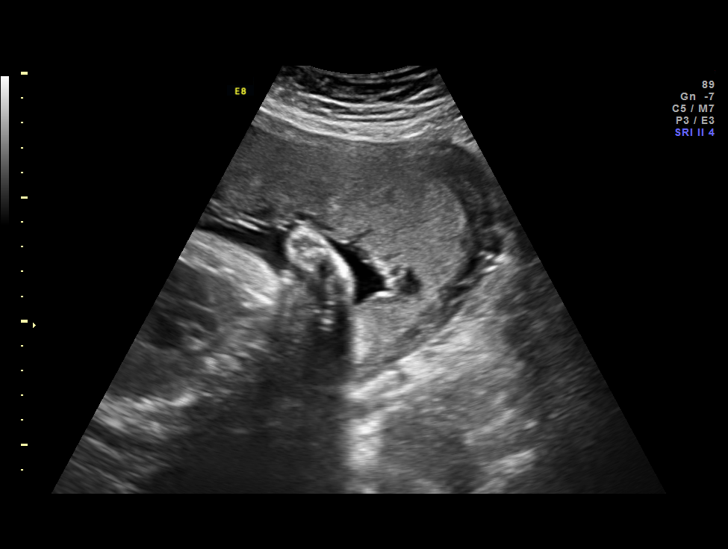
[im 17/39]
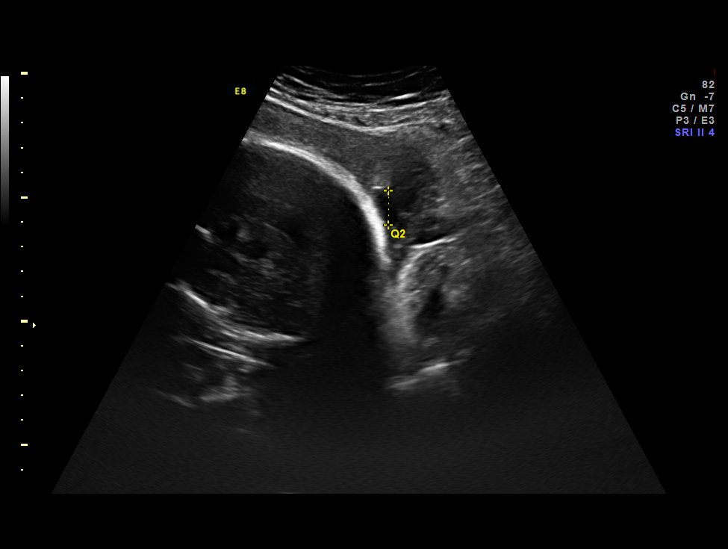
[im 22/39]
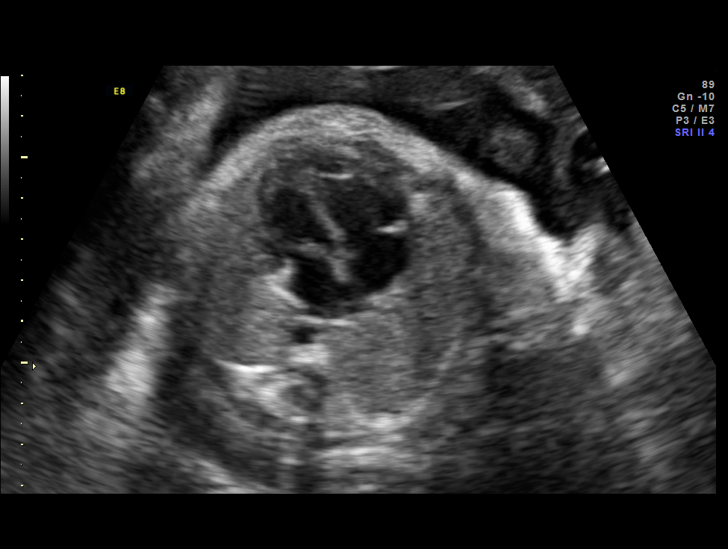
[im 24/39]
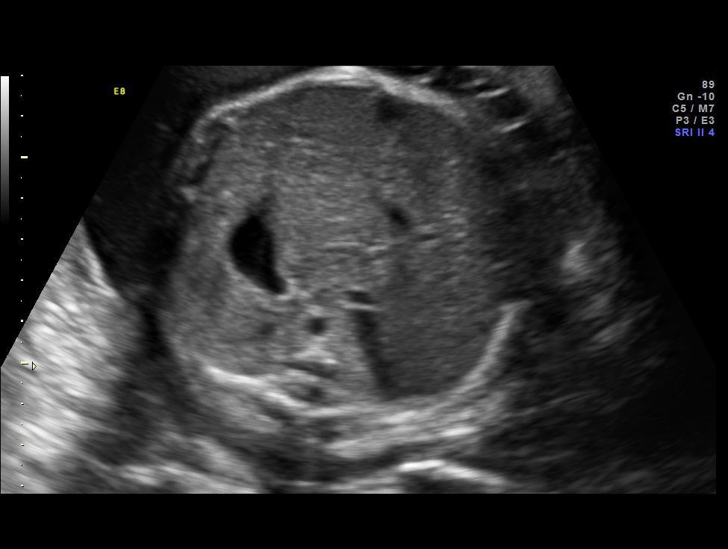
[im 27/39]
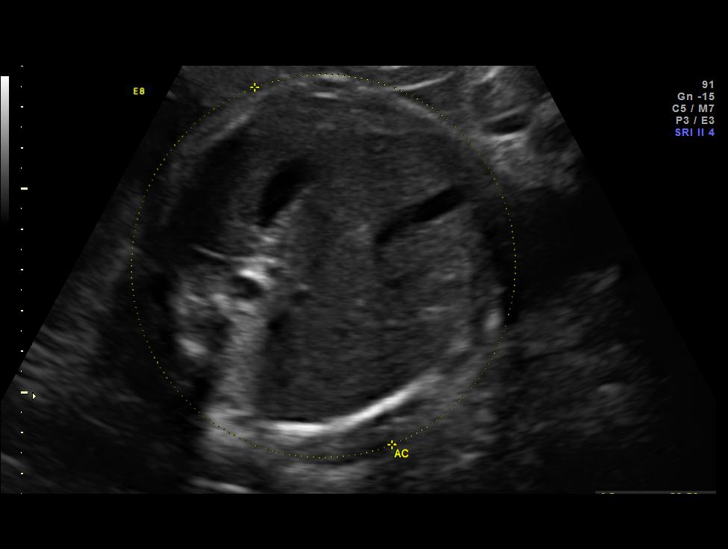
[im 31/39]
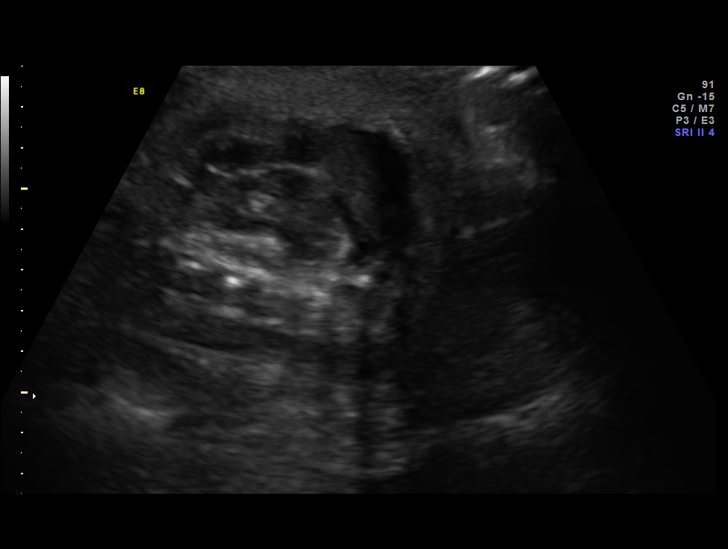
[im 34/39]
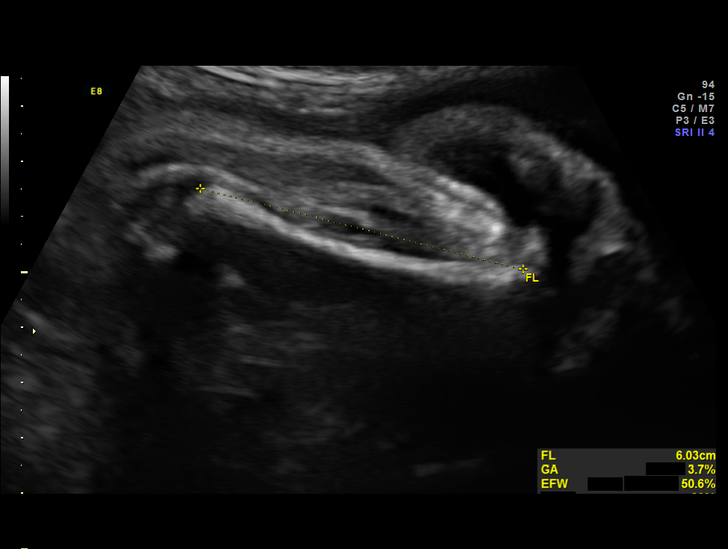
[im 37/39]
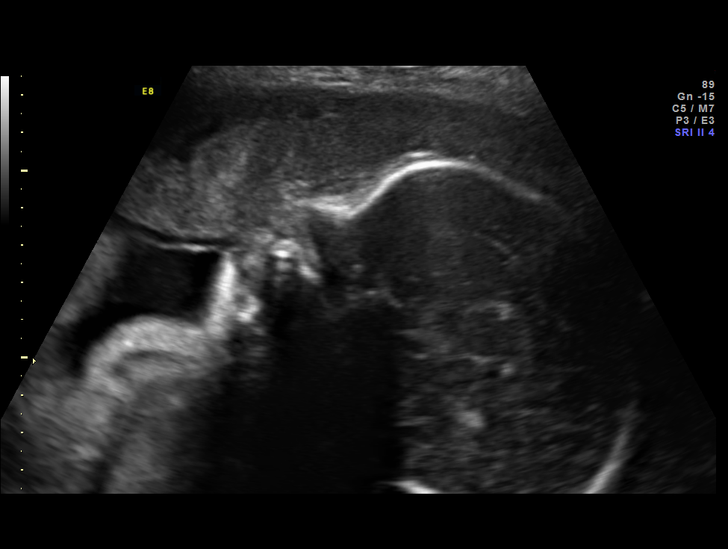

[12 of 28 positions shown; findings below may reference images not displayed]

OBSTETRICS REPORT
                      (Signed Final 01/02/2015 [DATE])

Service(s) Provided

 US OB FOLLOW UP                                       76816.1
Indications

 Advanced maternal age multigravida 35+, second
 trimester
 Poor obstetric history: Previous preeclampsia /
 eclampsia/gestational HTN
 32 weeks gestation of pregnancy
Fetal Evaluation

 Num Of Fetuses:    1
 Fetal Heart Rate:  129                          bpm
 Cardiac Activity:  Observed
 Presentation:      Cephalic
 Placenta:          Anterior, above cervical os
 P. Cord            Previously Visualized
 Insertion:

 Amniotic Fluid
 AFI FV:      Subjectively within normal limits
 AFI Sum:     13.03   cm       40  %Tile     Larg Pckt:    5.27  cm
 RUQ:   3.91    cm   RLQ:    5.27   cm    LUQ:   1.38    cm   LLQ:    2.47   cm
Biometry

 BPD:     77.5  mm     G. Age:  31w 1d                CI:         68.6   70 - 86
 OFD:      113  mm                                    FL/HC:      19.9   19.9 -

 HC:     305.9  mm     G. Age:  34w 1d       49  %    HC/AC:      1.01   0.96 -

 AC:     301.6  mm     G. Age:  34w 1d       86  %    FL/BPD:     78.6   71 - 87
 FL:      60.9  mm     G. Age:  31w 5d       15  %    FL/AC:      20.2   20 - 24
 HUM:       55  mm     G. Age:  32w 0d       42  %

 Est. FW:    1518  gm    4 lb 11 oz      66  %
Gestational Age

 LMP:           32w 5d        Date:  05/18/14                 EDD:   02/22/15
 U/S Today:     32w 5d                                        EDD:   02/22/15
 Best:          32w 5d     Det. By:  LMP  (05/18/14)          EDD:   02/22/15
Anatomy

 Cranium:          Previously seen        Aortic Arch:      Previously seen
 Fetal Cavum:      Previously seen        Ductal Arch:      Previously seen
 Ventricles:       Appears normal         Diaphragm:        Appears normal
 Choroid Plexus:   Previously seen        Stomach:          Appears normal, left
                                                            sided
 Cerebellum:       Previously seen        Abdomen:          Previously seen
 Posterior Fossa:  Previously seen        Abdominal Wall:   Previously seen
 Nuchal Fold:      Previously seen        Cord Vessels:     Previously seen
 Face:             Orbits and profile     Kidneys:          Appear normal
                   previously seen
 Lips:             Previously seen        Bladder:          Appears normal
 Heart:            Appears normal         Spine:            Previously seen
                   (4CH, axis, and
                   situs)
 RVOT:             Appears normal         Lower             Previously seen
                                          Extremities:
 LVOT:             Appears normal         Upper             Previously seen
                                          Extremities:

 Other:  Female gender. Heels previously seen.
Cervix Uterus Adnexa

 Cervix:       Normal appearance by transabdominal scan. Appears
               closed, without funnelling.
Impression

 Single IUP at 32w 5d
 Advanced maternal age, hx of preeclampsia in previous
 pregnancy
 Normal interval anatomy
 The estimated fetal weight today is at the 66th%'ile.
 Normal amniotic fluid volume
 No previa
Recommendations

 Given AMA, interval growth in 4 weeks.

## 2017-06-28 ENCOUNTER — Emergency Department (HOSPITAL_COMMUNITY)
Admission: EM | Admit: 2017-06-28 | Discharge: 2017-06-28 | Disposition: A | Discharge status: Home / Self Care | Payer: Self-pay | Attending: Emergency Medicine | Admitting: Emergency Medicine

## 2017-06-28 ENCOUNTER — Encounter (HOSPITAL_COMMUNITY): Payer: Self-pay

## 2017-06-28 DIAGNOSIS — K2900 Acute gastritis without bleeding: Secondary | ICD-10-CM | POA: Insufficient documentation

## 2017-06-28 DIAGNOSIS — E119 Type 2 diabetes mellitus without complications: Secondary | ICD-10-CM | POA: Insufficient documentation

## 2017-06-28 DIAGNOSIS — R1010 Upper abdominal pain, unspecified: Secondary | ICD-10-CM

## 2017-06-28 LAB — URINALYSIS, ROUTINE W REFLEX MICROSCOPIC
BILIRUBIN URINE: NEGATIVE
Bacteria, UA: NONE SEEN
Glucose, UA: NEGATIVE mg/dL
Ketones, ur: NEGATIVE mg/dL
Leukocytes, UA: NEGATIVE
Nitrite: NEGATIVE
PH: 7 (ref 5.0–8.0)
Protein, ur: NEGATIVE mg/dL
Specific Gravity, Urine: 1.005 (ref 1.005–1.030)
Squamous Epithelial / LPF: NONE SEEN

## 2017-06-28 LAB — COMPREHENSIVE METABOLIC PANEL
ALT: 18 U/L (ref 14–54)
AST: 20 U/L (ref 15–41)
Albumin: 3.5 g/dL (ref 3.5–5.0)
Alkaline Phosphatase: 44 U/L (ref 38–126)
Anion gap: 5 (ref 5–15)
BUN: 8 mg/dL (ref 6–20)
CALCIUM: 8.7 mg/dL — AB (ref 8.9–10.3)
CO2: 24 mmol/L (ref 22–32)
CREATININE: 0.59 mg/dL (ref 0.44–1.00)
Chloride: 109 mmol/L (ref 101–111)
GFR calc Af Amer: >60 mL/min (ref >60)
Glucose, Bld: 91 mg/dL (ref 65–99)
Potassium: 3.7 mmol/L (ref 3.5–5.1)
Sodium: 138 mmol/L (ref 135–145)
Total Bilirubin: 0.6 mg/dL (ref 0.3–1.2)
Total Protein: 6.6 g/dL (ref 6.5–8.1)

## 2017-06-28 LAB — CBC
HCT: 35.2 % — ABNORMAL LOW (ref 36.0–46.0)
Hemoglobin: 11.4 g/dL — ABNORMAL LOW (ref 12.0–15.0)
MCH: 26 pg (ref 26.0–34.0)
MCHC: 32.4 g/dL (ref 30.0–36.0)
MCV: 80.4 fL (ref 78.0–100.0)
PLATELETS: 263 10*3/uL (ref 150–400)
RBC: 4.38 MIL/uL (ref 3.87–5.11)
RDW: 14.2 % (ref 11.5–15.5)
WBC: 5.3 10*3/uL (ref 4.0–10.5)

## 2017-06-28 LAB — I-STAT BETA HCG BLOOD, ED (MC, WL, AP ONLY): I-stat hCG, quantitative: <5 m[IU]/mL (ref <5)

## 2017-06-28 LAB — LIPASE, BLOOD: LIPASE: 28 U/L (ref 11–51)

## 2017-06-28 MED ORDER — PANTOPRAZOLE SODIUM 20 MG PO TBEC: 20.0000 mg | DELAYED_RELEASE_TABLET | Freq: Every day | ORAL | 0 refills | Status: DC

## 2017-06-28 MED ORDER — GI COCKTAIL ~~LOC~~
30.0000 mL | Freq: Once | ORAL | Status: AC
  Administered 2017-06-28: 30 mL via ORAL
  Filled 2017-06-28: qty 30

## 2017-06-28 NOTE — ED Provider Notes (Signed)
[facility] Provider Note   CSN: [id number] Arrival date & time: 06/28/17  1116     History   Chief Complaint Chief Complaint  Patient presents with  . Abdominal Pain    HPI Palyn Scrima is a 41 y.o. female.  HPI  41 y.o. female with a hx of DM, presents to the Emergency Department today due to epigastric pain x 1 week. Associated nausea. No emesis. No diarrhea. Notes worse with PO intake. Minimal pain without PO intake. Rates pain 3/10. Burning sensation. Denies melena or hematochezia. No CP/SOB. Noted dysuria. No fevers. No dysuria. No vaginal bleeding/discharge. No meds PTA. No other symptoms noted    Past Medical History:  Diagnosis Date  . AMA (advanced maternal age) multigravida 35+   . Anemia   . Anxiety    celexa  . Diabetes mellitus without complication (HCC) 2016   current/last pregnancy    Patient Active Problem List   Diagnosis Date Noted  . NSVD (normal spontaneous vaginal delivery) 02/16/2015  . Gestational diabetes 02/15/2015  . [redacted] weeks gestation of pregnancy   . Supervision of high risk pregnancy in third trimester 01/06/2015  . GDM (gestational diabetes mellitus) 01/06/2015  . History of pregnancy induced hypertension 01/06/2015  . Language barrier 01/06/2015  . 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 Anemia 01/06/2015  . AMA (advanced maternal age) multigravida 35+     History reviewed. No pertinent surgical history.  OB History    Gravida Para Term Preterm AB Living   6 5 5   1 5    SAB TAB Ectopic Multiple Live Births   1     0 5       Home Medications    Prior to Admission medications   Not on File    Family History Family History  Problem Relation Age of Onset  . Diabetes Mother   . Diabetes Father     Social History Social History  Substance Use Topics  . Smoking status: Never Smoker  . Smokeless tobacco: Never Used  . Alcohol use No     Allergies   Patient has no known allergies.   Review of Systems Review  of Systems ROS reviewed and all are negative for acute change except as noted in the HPI.  Physical Exam Updated Vital Signs BP 137/63 (BP Location: Right Arm)   Pulse (!) 108   Temp 97.9 F (36.6 C) (Oral)   Resp 16   LMP 06/26/2017 (Exact Date)   SpO2 100%   Physical Exam  Constitutional: She is oriented to person, place, and time. Vital signs are normal. She appears well-developed and well-nourished. No distress.  NAD  HENT:  Head: Normocephalic and atraumatic.  Right Ear: Hearing, tympanic membrane, external ear and ear canal normal.  Left Ear: Hearing, tympanic membrane, external ear and ear canal normal.  Nose: Nose normal.  Mouth/Throat: Uvula is midline, oropharynx is clear and moist and mucous membranes are normal. No trismus in the jaw. No oropharyngeal exudate, posterior oropharyngeal erythema or tonsillar abscesses.  Eyes: Pupils are equal, round, and reactive to light. Conjunctivae and EOM are normal.  Neck: Normal range of motion. Neck supple. No tracheal deviation present.  Cardiovascular: Normal rate, regular rhythm, S1 normal, S2 normal, normal heart sounds, intact distal pulses and normal pulses.   Pulmonary/Chest: Effort normal and breath sounds normal. No respiratory distress. She has no decreased breath sounds. She has no wheezes. She has no rhonchi. She has no rales.  Abdominal: Normal appearance and bowel  sounds are normal. There is tenderness in the epigastric area and left upper quadrant. There is no rigidity, no rebound, no guarding, no CVA tenderness, no tenderness at McBurney's point and negative Murphy's sign.  Abdomen soft  Musculoskeletal: Normal range of motion.  Neurological: She is alert and oriented to person, place, and time.  Skin: Skin is warm and dry.  Psychiatric: She has a normal mood and affect. Her speech is normal and behavior is normal. Thought content normal.     ED Treatments / Results  Labs (all labs ordered are listed, but only  abnormal results are displayed) Labs Reviewed  COMPREHENSIVE METABOLIC PANEL - Abnormal; Notable for the following:       Result Value   Calcium 8.7 (*)    All other components within normal limits  CBC - Abnormal; Notable for the following:    Hemoglobin 11.4 (*)    HCT 35.2 (*)    All other components within normal limits  URINALYSIS, ROUTINE W REFLEX MICROSCOPIC - Abnormal; Notable for the following:    Color, Urine STRAW (*)    Hgb urine dipstick MODERATE (*)    All other components within normal limits  LIPASE, BLOOD  I-STAT BETA HCG BLOOD, ED ([facility], [facility], [facility] ONLY)    EKG  EKG Interpretation None       Radiology No results found.  Procedures Procedures (including critical care time)  Medications Ordered in ED Medications - No data to display   Initial Impression / Assessment and Plan / ED Course  I have reviewed the triage vital signs and the nursing notes.  Pertinent labs & imaging results that were available during my care of the patient were reviewed by me and considered in my medical decision making (see chart for details).  Final Clinical Impressions(s) / ED Diagnoses  {I have reviewed and evaluated the relevant laboratory values.   {I have reviewed the relevant previous healthcare records.  {I obtained HPI from historian.   ED Course:  Assessment: Patient is a 41 y.o. female with a hx of DM, presents to the Emergency Department today due to epigastric pain x 1 week. Associated nausea. No emesis. No diarrhea. Notes worse with PO intake. Minimal pain without PO intake. Rates pain 3/10. Burning sensation. Denies melena or hematochezia. No CP/SOB. Noted dysuria. No fevers. No dysuria. No vaginal bleeding/discharge. No meds PTA.. On exam, nontoxic, nonseptic appearing, in no apparent distress. Patient's pain and other symptoms adequately managed in emergency department.  Labs and vitals reviewed.  Patient does not meet the SIRS or Sepsis criteria.  On repeat exam  patient does not have a surgical abdomen and there are no peritoneal signs.  No indication of appendicitis, bowel obstruction, bowel perforation, cholecystitis, diverticulitis, PID or ectopic pregnancy. Likely gastritis vs ulcer. Given GI cocktail with relief. Will Rx Protonix and referral to GI. Patient discharged home with symptomatic treatment and given strict instructions for follow-up with their primary care physician.  I have also discussed reasons to return immediately to the ER.  Patient expresses understanding and agrees with plan.  Disposition/Plan:  DC Home Additional Verbal discharge instructions given and discussed with patient.  Pt Instructed to f/u with PCP in the next week for evaluation and treatment of symptoms. Return precautions given Pt acknowledges and agrees with plan  Supervising Physician Arby Barrette, MD  Final diagnoses:  Upper abdominal pain  Acute gastritis, presence of bleeding unspecified, unspecified gastritis type    New Prescriptions New Prescriptions   No  medications on file     Audry Pili, Cordelia Poche 06/28/17 1514    Arby Barrette, MD 06/29/17 [phone]

## 2017-06-28 NOTE — ED Triage Notes (Signed)
Pt presents for evaluation of epigastric pain x 1 week. Pt reports nausea and dysuria. Reports menstrual period started 2 days ago. Denies vomiting/diarrhea.

## 2017-06-28 NOTE — Discharge Instructions (Addendum)
Please read and follow all provided instructions.  Your diagnoses today include:  1. Upper abdominal pain   2. Acute gastritis, presence of bleeding unspecified, unspecified gastritis type     Tests performed today include: Blood counts and electrolytes Blood tests to check liver and kidney function Blood tests to check pancreas function Urine test to look for infection and pregnancy (in women) Vital signs. See below for your results today.   Medications prescribed:   Take any prescribed medications only as directed.  Home care instructions:  Follow any educational materials contained in this packet.  Follow-up instructions: Please follow-up with your primary care provider in the next 2 days for further evaluation of your symptoms.    Return instructions:  SEEK IMMEDIATE MEDICAL ATTENTION IF: The pain does not go away or becomes severe  A temperature above 101F develops  Repeated vomiting occurs (multiple episodes)  The pain becomes localized to portions of the abdomen. The right side could possibly be appendicitis. In an adult, the left lower portion of the abdomen could be colitis or diverticulitis.  Blood is being passed in stools or vomit (bright red or black tarry stools)  You develop chest pain, difficulty breathing, dizziness or fainting, or become confused, poorly responsive, or inconsolable (young children) If you have any other emergent concerns regarding your health  Additional Information: Abdominal (belly) pain can be caused by many things. Your caregiver performed an examination and possibly ordered blood/urine tests and imaging (CT scan, x-rays, ultrasound). Many cases can be observed and treated at home after initial evaluation in the emergency department. Even though you are being discharged home, abdominal pain can be unpredictable. Therefore, you need a repeated exam if your pain does not resolve, returns, or worsens. Most patients with abdominal pain don't have  to be admitted to the hospital or have surgery, but serious problems like appendicitis and gallbladder attacks can start out as nonspecific pain. Many abdominal conditions cannot be diagnosed in one visit, so follow-up evaluations are very important.  Your vital signs today were: BP 137/63 (BP Location: Right Arm)    Pulse (!) 108    Temp 97.9 F (36.6 C) (Oral)    Resp 16    LMP 06/26/2017 (Exact Date)    SpO2 100%  If your blood pressure (bp) was elevated above 135/85 this visit, please have this repeated by your doctor within one month. --------------

## 2017-06-30 ENCOUNTER — Encounter: Payer: Self-pay | Admitting: Physician Assistant

## 2017-07-15 ENCOUNTER — Ambulatory Visit: Payer: Self-pay | Admitting: Physician Assistant

## 2021-02-23 ENCOUNTER — Emergency Department (HOSPITAL_COMMUNITY): Payer: Self-pay

## 2021-02-23 ENCOUNTER — Emergency Department (HOSPITAL_COMMUNITY)
Admission: EM | Admit: 2021-02-23 | Discharge: 2021-02-24 | Disposition: A | Discharge status: Home / Self Care | Payer: Self-pay | Attending: Emergency Medicine | Admitting: Emergency Medicine

## 2021-02-23 DIAGNOSIS — R519 Headache, unspecified: Secondary | ICD-10-CM

## 2021-02-23 DIAGNOSIS — I10 Essential (primary) hypertension: Secondary | ICD-10-CM | POA: Insufficient documentation

## 2021-02-23 DIAGNOSIS — E119 Type 2 diabetes mellitus without complications: Secondary | ICD-10-CM | POA: Insufficient documentation

## 2021-02-23 LAB — CBC
HCT: 39.6 % (ref 36.0–46.0)
Hemoglobin: 12.7 g/dL (ref 12.0–15.0)
MCH: 27 pg (ref 26.0–34.0)
MCHC: 32.1 g/dL (ref 30.0–36.0)
MCV: 84.1 fL (ref 80.0–100.0)
Platelets: 276 10*3/uL (ref 150–400)
RBC: 4.71 MIL/uL (ref 3.87–5.11)
RDW: 15 % (ref 11.5–15.5)
WBC: 9.8 10*3/uL (ref 4.0–10.5)
nRBC: 0 % (ref 0.0–0.2)

## 2021-02-23 LAB — I-STAT CHEM 8, ED
BUN: 14 mg/dL (ref 6–20)
Calcium, Ion: 1.13 mmol/L — ABNORMAL LOW (ref 1.15–1.40)
Chloride: 101 mmol/L (ref 98–111)
Creatinine, Ser: 0.6 mg/dL (ref 0.44–1.00)
Glucose, Bld: 100 mg/dL — ABNORMAL HIGH (ref 70–99)
HCT: 40 % (ref 36.0–46.0)
Hemoglobin: 13.6 g/dL (ref 12.0–15.0)
Potassium: 3.6 mmol/L (ref 3.5–5.1)
Sodium: 138 mmol/L (ref 135–145)
TCO2: 27 mmol/L (ref 22–32)

## 2021-02-23 LAB — DIFFERENTIAL
Abs Immature Granulocytes: 0.04 10*3/uL (ref 0.00–0.07)
Basophils Absolute: 0 10*3/uL (ref 0.0–0.1)
Basophils Relative: 0 %
Eosinophils Absolute: 0.1 10*3/uL (ref 0.0–0.5)
Eosinophils Relative: 1 %
Immature Granulocytes: 0 %
Lymphocytes Relative: 25 %
Lymphs Abs: 2.5 10*3/uL (ref 0.7–4.0)
Monocytes Absolute: 0.5 10*3/uL (ref 0.1–1.0)
Monocytes Relative: 5 %
Neutro Abs: 6.7 10*3/uL (ref 1.7–7.7)
Neutrophils Relative %: 69 %

## 2021-02-23 LAB — PROTIME-INR
INR: 1.1 (ref 0.8–1.2)
Prothrombin Time: 14.3 seconds (ref 11.4–15.2)

## 2021-02-23 LAB — I-STAT BETA HCG BLOOD, ED (MC, WL, AP ONLY): I-stat hCG, quantitative: <5 m[IU]/mL (ref <5)

## 2021-02-23 LAB — APTT: aPTT: 29 seconds (ref 24–36)

## 2021-02-23 NOTE — ED Provider Notes (Signed)
MSE was initiated and I personally evaluated the patient and placed orders (if any) at  11:07 PM on February 23, 2021.  The patient appears stable so that the remainder of the MSE may be completed by another provider.   Patient placed in Quick Look pathway, seen and evaluated   Chief Complaint: HA, numbness  HPI:   45 y.o. F who presents for evaluation of left-sided headache, left-sided facial numbness that began at 5 PM this evening.  She states that she had a checkup today and was noted to have a blood pressure to be elevated.  She does not have any history of blood pressure.  She states that they told him to go to the emergency department.  She states that only the left side of her face is numb.  She has some associated blurred vision but no double vision, vision loss.  Denies any numbness/weakness of arms or legs.  No chest pain, difficulty breathing.  ROS: Headache, numbness.  Physical Exam:   Gen: No distress  Neuro: Awake and Alert  Skin: Warm    Focused Exam: Cranial nerves III-XII intact Follows commands, Moves all extremities  5/5 strength to BUE and BLE  Reports decrease sensation noted to the V1, V2, V3 distribution of the left face.  Sensation intact throughout all major nerve distributions No pronator drift. No gait abnormalities  No slurred speech. No facial droop.   Code stroke not initiated given that her symptoms started greater than 4 hours ago.   Initiation of care has begun. The patient has been counseled on the process, plan, and necessity for staying for the completion/evaluation, and the remainder of the medical screening examination    Rosana Hoes 02/23/21 2309    Shon Baton, MD 02/24/21 0110

## 2021-02-23 NOTE — ED Triage Notes (Signed)
Pt reports her blood pressure was very high at a check up appt this morning. Pt reports now has a headache. Pt denies hx of HBP. Denies CP, SOB. Pt reports numb sensation in left side of her face and blurred vision. No droop, drift or slurred speech noted.

## 2021-02-24 LAB — URINALYSIS, ROUTINE W REFLEX MICROSCOPIC
Bacteria, UA: NONE SEEN
Bilirubin Urine: NEGATIVE
Glucose, UA: NEGATIVE mg/dL
Ketones, ur: NEGATIVE mg/dL
Leukocytes,Ua: NEGATIVE
Nitrite: NEGATIVE
Protein, ur: NEGATIVE mg/dL
Specific Gravity, Urine: 1.024 (ref 1.005–1.030)
pH: 5 (ref 5.0–8.0)

## 2021-02-24 LAB — COMPREHENSIVE METABOLIC PANEL
ALT: 15 U/L (ref 0–44)
AST: 17 U/L (ref 15–41)
Albumin: 3.7 g/dL (ref 3.5–5.0)
Alkaline Phosphatase: 43 U/L (ref 38–126)
Anion gap: 7 (ref 5–15)
BUN: 12 mg/dL (ref 6–20)
CO2: 25 mmol/L (ref 22–32)
Calcium: 8.6 mg/dL — ABNORMAL LOW (ref 8.9–10.3)
Chloride: 101 mmol/L (ref 98–111)
Creatinine, Ser: 0.7 mg/dL (ref 0.44–1.00)
GFR, Estimated: >60 mL/min (ref >60)
Glucose, Bld: 98 mg/dL (ref 70–99)
Potassium: 3.5 mmol/L (ref 3.5–5.1)
Sodium: 133 mmol/L — ABNORMAL LOW (ref 135–145)
Total Bilirubin: 1.2 mg/dL (ref 0.3–1.2)
Total Protein: 6.6 g/dL (ref 6.5–8.1)

## 2021-02-24 LAB — RAPID URINE DRUG SCREEN, HOSP PERFORMED
Amphetamines: NOT DETECTED
Barbiturates: NOT DETECTED
Benzodiazepines: NOT DETECTED
Cocaine: NOT DETECTED
Opiates: NOT DETECTED
Tetrahydrocannabinol: NOT DETECTED

## 2021-02-24 LAB — ETHANOL: Alcohol, Ethyl (B): <10 mg/dL (ref <10)

## 2021-02-24 MED ORDER — KETOROLAC TROMETHAMINE 30 MG/ML IJ SOLN
30.0000 mg | Freq: Once | INTRAMUSCULAR | Status: AC
  Administered 2021-02-24: 30 mg via INTRAMUSCULAR
  Filled 2021-02-24: qty 1

## 2021-02-24 MED ORDER — PROMETHAZINE HCL 25 MG/ML IJ SOLN: 25.0000 mg | Freq: Four times a day (QID) | INTRAMUSCULAR | Status: DC | PRN

## 2021-02-24 NOTE — Discharge Instructions (Addendum)
You were seen today for headache and high blood pressure.  Follow-up with your primary physician for recheck of your blood pressure.  Take Tylenol Motrin as needed for repeat headache.  Your CT imaging and other work-up is reassuring.

## 2021-02-24 NOTE — ED Provider Notes (Signed)
[facility] EMERGENCY DEPARTMENT Provider Note   CSN: [id number] Arrival date & time: 02/23/21  2248     History No chief complaint on file.   Kathleen Tucker is a 45 y.o. female.  HPI     This is a 45 year old female with a history of anxiety who presents with a headache.  Patient reports that she was in clinic yesterday and noted to have high blood pressure.  She was referred to the emergency room because she was having ongoing left-sided headache.  She states mostly left face and left posterior scalp.  She has no known history of migraines or headaches.  She has noted some tingling and numbness of the left face.  No vision changes or weakness.  Denies any strokelike symptoms.  Patient rates her pain at 5 out of 10.  She has no known history of high blood pressure.  Has not had any nausea or vomiting.    Past Medical History:  Diagnosis Date  . AMA (advanced maternal age) multigravida 35+   . Anemia   . Anxiety    celexa  . Diabetes mellitus without complication (HCC) 2016   current/last pregnancy    Patient Active Problem List   Diagnosis Date Noted  . NSVD (normal spontaneous vaginal delivery) 02/16/2015  . Gestational diabetes 02/15/2015  . [redacted] weeks gestation of pregnancy   . Supervision of high risk pregnancy in third trimester 01/06/2015  . GDM (gestational diabetes mellitus) 01/06/2015  . History of pregnancy induced hypertension 01/06/2015  . Language barrier 01/06/2015  . 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 Anemia 01/06/2015  . AMA (advanced maternal age) multigravida 35+     No past surgical history on file.   OB History    Gravida  6   Para  5   Term  5   Preterm      AB  1   Living  5     SAB  1   IAB      Ectopic      Multiple  0   Live Births  5           Family History  Problem Relation Age of Onset  . Diabetes Mother   . Diabetes Father     Social History   Tobacco Use  . Smoking status: Never Smoker   . Smokeless tobacco: Never Used  Substance Use Topics  . Alcohol use: No  . Drug use: No    Home Medications Prior to Admission medications   Medication Sig Start Date End Date Taking? Authorizing Provider  pantoprazole (PROTONIX) 20 MG tablet Take 1 tablet (20 mg total) by mouth daily. 06/28/17   Audry Pili, PA-C    Allergies    Patient has no known allergies.  Review of Systems   Review of Systems  Constitutional: Negative for fever.  Respiratory: Negative for shortness of breath.   Cardiovascular: Negative for chest pain.  Gastrointestinal: Negative for abdominal pain, nausea and vomiting.  Neurological: Positive for numbness and headaches. Negative for dizziness, syncope, speech difficulty and weakness.  All other systems reviewed and are negative.   Physical Exam Updated Vital Signs BP (!) 146/89 (BP Location: Right Arm)   Pulse (!) 54   Temp 98.2 F (36.8 C) (Temporal)   Resp 11   SpO2 98%   Physical Exam Vitals and nursing note reviewed.  Constitutional:      Appearance: She is well-developed. She is not ill-appearing.  HENT:  Head: Normocephalic and atraumatic.     Nose: Nose normal.     Mouth/Throat:     Mouth: Mucous membranes are moist.  Eyes:     Pupils: Pupils are equal, round, and reactive to light.  Cardiovascular:     Rate and Rhythm: Normal rate and regular rhythm.     Heart sounds: Normal heart sounds.  Pulmonary:     Effort: Pulmonary effort is normal. No respiratory distress.     Breath sounds: No wheezing.  Abdominal:     General: Bowel sounds are normal.     Palpations: Abdomen is soft.     Tenderness: There is no abdominal tenderness.  Musculoskeletal:     Cervical back: Neck supple.     Right lower leg: No edema.     Left lower leg: No edema.  Skin:    General: Skin is warm and dry.  Neurological:     Mental Status: She is alert and oriented to person, place, and time.     Comments: Cranial nerves II through XII intact, 5 out  of 5 strength in all 4 extremities, no dysmetria to finger-nose-finger, no drift  Psychiatric:        Mood and Affect: Mood normal.     ED Results / Procedures / Treatments   Labs (all labs ordered are listed, but only abnormal results are displayed) Labs Reviewed  COMPREHENSIVE METABOLIC PANEL - Abnormal; Notable for the following components:      Result Value   Sodium 133 (*)    Calcium 8.6 (*)    All other components within normal limits  URINALYSIS, ROUTINE W REFLEX MICROSCOPIC - Abnormal; Notable for the following components:   Hgb urine dipstick LARGE (*)    All other components within normal limits  I-STAT CHEM 8, ED - Abnormal; Notable for the following components:   Glucose, Bld 100 (*)    Calcium, Ion 1.13 (*)    All other components within normal limits  ETHANOL  PROTIME-INR  APTT  CBC  DIFFERENTIAL  RAPID URINE DRUG SCREEN, HOSP PERFORMED  I-STAT BETA HCG BLOOD, ED ([facility], [facility], [facility] ONLY)    EKG EKG Interpretation  Date/Time:  Monday February 23 2021 23:45:45 EDT Ventricular Rate:  65 PR Interval:  170 QRS Duration: 72 QT Interval:  406 QTC Calculation: 422 R Axis:   80 Text Interpretation: Normal sinus rhythm Normal ECG Confirmed by Ross Marcus ([id number]) on 02/24/2021 5:17:52 AM   Radiology CT Head Wo Contrast  Result Date: 02/24/2021 CLINICAL DATA:  High blood pressure this morning. Headache and numbness. EXAM: CT HEAD WITHOUT CONTRAST TECHNIQUE: Contiguous axial images were obtained from the base of the skull through the vertex without intravenous contrast. COMPARISON:  None. FINDINGS: Brain: No evidence of acute infarction, hemorrhage, hydrocephalus, extra-axial collection or mass lesion/mass effect. Vascular: No hyperdense vessel or unexpected calcification. Skull: The calvarium appears intact. Sinuses/Orbits: Retention cyst in the left maxillary antrum. Paranasal sinuses and mastoid air cells are otherwise clear. Other: None. IMPRESSION: No acute intracranial  abnormalities. Electronically Signed   By: Burman Nieves M.D.   On: 02/24/2021 00:16    Procedures Procedures   Medications Ordered in ED Medications  ketorolac (TORADOL) 30 MG/ML injection 30 mg (30 mg Intramuscular Given 02/24/21 0536)    ED Course  I have reviewed the triage vital signs and the nursing notes.  Pertinent labs & imaging results that were available during my care of the patient were reviewed by me and considered in my  medical decision making (see chart for details).    MDM Rules/Calculators/A&P                          Patient presents with reports of hypertension and headache.  She is overall nontoxic and vital signs are initially notable for high blood pressure.  Denies any history of the same.  Reports left-sided headache with paresthesias over the left side of the face.  No known history of migraines.  She is neurologically intact.  No objective signs of stroke.  Work-up sent from triage including full stroke protocol.  CT head is negative for acute bleed.  Labs obtained and largely reassuring.  On my evaluation, her neurologic exam remained stable.  Her blood pressure has trended downward.  She does continue to endorse a headache.  She was treated with migraine cocktail.  I have low suspicion at this time for hypertensive urgency or emergency.  On recheck, she reports complete resolution of symptoms.  Recommend that she follow-up closely in clinic for blood pressure recheck and potential initiation of blood pressure medications.  Patient stated understanding.  After history, exam, and medical workup I feel the patient has been appropriately medically screened and is safe for discharge home. Pertinent diagnoses were discussed with the patient. Patient was given return precautions.  Final Clinical Impression(s) / ED Diagnoses Final diagnoses:  Acute nonintractable headache, unspecified headache type  Primary hypertension    Rx / DC Orders ED Discharge Orders     None       Sotirios Navarro, Mayer Masker, MD 02/25/21 [phone]

## 2021-03-31 ENCOUNTER — Encounter (HOSPITAL_COMMUNITY): Payer: Self-pay | Admitting: Emergency Medicine

## 2021-03-31 ENCOUNTER — Emergency Department (HOSPITAL_COMMUNITY)
Admission: EM | Admit: 2021-03-31 | Discharge: 2021-04-01 | Disposition: A | Discharge status: Home / Self Care | Payer: Self-pay | Attending: Emergency Medicine | Admitting: Emergency Medicine

## 2021-03-31 ENCOUNTER — Other Ambulatory Visit: Payer: Self-pay

## 2021-03-31 ENCOUNTER — Emergency Department (HOSPITAL_COMMUNITY): Payer: Self-pay

## 2021-03-31 DIAGNOSIS — J069 Acute upper respiratory infection, unspecified: Secondary | ICD-10-CM | POA: Insufficient documentation

## 2021-03-31 DIAGNOSIS — R000 Tachycardia, unspecified: Secondary | ICD-10-CM | POA: Insufficient documentation

## 2021-03-31 DIAGNOSIS — E119 Type 2 diabetes mellitus without complications: Secondary | ICD-10-CM | POA: Insufficient documentation

## 2021-03-31 DIAGNOSIS — Z20822 Contact with and (suspected) exposure to covid-19: Secondary | ICD-10-CM | POA: Insufficient documentation

## 2021-03-31 LAB — BASIC METABOLIC PANEL
Anion gap: 12 (ref 5–15)
BUN: 7 mg/dL (ref 6–20)
CO2: 22 mmol/L (ref 22–32)
Calcium: 9.1 mg/dL (ref 8.9–10.3)
Chloride: 105 mmol/L (ref 98–111)
Creatinine, Ser: 0.75 mg/dL (ref 0.44–1.00)
GFR, Estimated: >60 mL/min (ref >60)
Glucose, Bld: 89 mg/dL (ref 70–99)
Potassium: 3.3 mmol/L — ABNORMAL LOW (ref 3.5–5.1)
Sodium: 139 mmol/L (ref 135–145)

## 2021-03-31 LAB — CBC
HCT: 42.7 % (ref 36.0–46.0)
Hemoglobin: 13.8 g/dL (ref 12.0–15.0)
MCH: 26.7 pg (ref 26.0–34.0)
MCHC: 32.3 g/dL (ref 30.0–36.0)
MCV: 82.6 fL (ref 80.0–100.0)
Platelets: 230 10*3/uL (ref 150–400)
RBC: 5.17 MIL/uL — ABNORMAL HIGH (ref 3.87–5.11)
RDW: 14.2 % (ref 11.5–15.5)
WBC: 14.4 10*3/uL — ABNORMAL HIGH (ref 4.0–10.5)
nRBC: 0 % (ref 0.0–0.2)

## 2021-03-31 LAB — I-STAT BETA HCG BLOOD, ED (MC, WL, AP ONLY): I-stat hCG, quantitative: <5 m[IU]/mL (ref <5)

## 2021-03-31 LAB — TROPONIN I (HIGH SENSITIVITY): Troponin I (High Sensitivity): 13 ng/L (ref <18)

## 2021-03-31 NOTE — ED Triage Notes (Signed)
Pt reports headache, fever, sore throat and chills that has been constant since Saturday. Pt reports generalized chest pain started today with some congestion. Fever of 100.65F at home.

## 2021-03-31 NOTE — ED Provider Notes (Signed)
Emergency Medicine Provider Triage Evaluation Note  Kathleen Tucker , a 45 y.o. female  was evaluated in triage.  Pt complains of sore throat.  The patient endorses a sore throat for the last 4 days.  She has since developed headache, fever, chills.  Over the last 24 hours, she endorses some generalized, aching chest pain, nasal congestion.  When asked about her chest pain, she points to the lower part of her throat.  She last took Tylenol for her symptoms this morning.  She is unvaccinated against COVID-19.  No known sick contacts.  No vomiting, leg swelling, palpitations.  Review of Systems  Positive: Fever, chills, headache, nasal congestion, sore throat, chest pain. Negative: Abdominal pain, vomiting, leg swelling, palpitations  Physical Exam  BP (!) 159/123 (BP Location: Right Arm)   Pulse (!) 107   Temp 100 F (37.8 C) (Oral)   Resp 20   Ht 5\' 5"  (1.651 m)   Wt 77.1 kg   LMP 03/02/2021   SpO2 99%   BMI 28.29 kg/m  Gen:   Awake, no distress   Resp:  Normal effort, lungs are clear to auscultation bilaterally. MSK:   Moves extremities without difficulty  Other:  Tonsils are 2+ bilaterally erythema.  Uvula is midline.  No exudates.  Tolerating secretions.  No trismus.  No sublingual edema.  Medical Decision Making  Medically screening exam initiated at 11:47 PM.  Appropriate orders placed.  Oluwatoyin Banales was informed that the remainder of the evaluation will be completed by another provider, this initial triage assessment does not replace that evaluation, and the importance of remaining in the ED until their evaluation is complete.  45 year old female who presents the emergency department with a 4-day history of viral URI symptoms including sore throat, headache, fever, chills.  Over the last 24 hours, she endorses shortness of breath and chest pain.  Patient was initially seen in triage prior to [facility] by clinician.  Labs, including troponin were ordered.  Her first troponin was  normal.  Will cancel second troponin as her symptoms seem more consistent with viral URI.  I am much less suspicious for pericarditis or myocarditis.  Much less suspicious for ACS.  Hear score is 1.  Her chest x-ray is reassuring.  We will add on COVID-19, influenza testing as well as strep PCR testing.  She will require further work-up and evaluation in the emergency department.   [facility], PA-C 04/01/21 0037    06/01/21, MD 04/05/21 [phone]

## 2021-04-01 LAB — RESP PANEL BY RT-PCR (FLU A&B, COVID) ARPGX2
Influenza A by PCR: NEGATIVE
Influenza B by PCR: NEGATIVE
SARS Coronavirus 2 by RT PCR: NEGATIVE

## 2021-04-01 LAB — GROUP A STREP BY PCR: Group A Strep by PCR: NOT DETECTED

## 2021-04-01 MED ORDER — ACETAMINOPHEN 500 MG PO TABS
1000.0000 mg | ORAL_TABLET | Freq: Once | ORAL | Status: AC
  Administered 2021-04-01: 1000 mg via ORAL
  Filled 2021-04-01: qty 2

## 2021-04-01 MED ORDER — ONDANSETRON 4 MG PO TBDP: ORAL_TABLET | ORAL | 0 refills | Status: DC

## 2021-04-01 MED ORDER — BENZONATATE 100 MG PO CAPS: 100.0000 mg | ORAL_CAPSULE | Freq: Three times a day (TID) | ORAL | 0 refills | Status: DC

## 2021-04-01 NOTE — Discharge Instructions (Addendum)
Your COVID test, your flu test, and your strep test are all negative.  Your blood work was otherwise unremarkable.  Your chest x-ray did not show a pneumonia.  Your symptoms are consistent with a viral illness.  They should get better on their own.  Please follow-up with your family doctor.  I have prescribed you cough and nausea medicine in case you need it at home.  Take tylenol 2 pills 4 times a day and motrin 4 pills 3 times a day.  Drink plenty of fluids.  Return for worsening shortness of breath, headache, confusion. Follow up with your family doctor.    Su prueba de COVID, su prueba de gripe y su prueba de estreptococo son todas negativas. Su anlisis de [employer] por lo dems normal. Su radiografa de trax no mostr neumona. Sus sntomas son consistentes con una enfermedad viral. Deberan mejorar solos. Por favor, haga un seguimiento con su mdico de familia. Le he recetado medicamentos para la tos y las nuseas en caso de que los necesite en casa.  Tome tylenol 2 pastillas 4 veces al da y motrin 4 pastillas 3 veces al [employer]. Beber mucho lquido. Regrese por empeoramiento de dificultad para respirar, dolor de cabeza, confusin. Seguimiento con su mdico de familia.

## 2021-04-01 NOTE — ED Provider Notes (Signed)
[facility] EMERGENCY DEPARTMENT Provider Note   CSN: [id number] Arrival date & time: 03/31/21  2119     History Chief Complaint  Patient presents with  . Headache  . Chest Pain  . Sore Throat    Kathleen Tucker is a 45 y.o. female.  45 yo F with a chief complaints of cough congestion fevers chills myalgias and sore throat.  Going on for about 4 days now.  No known sick contacts no nausea vomiting or diarrhea no abdominal pain.  The history is provided by the patient.  Headache Associated symptoms: congestion, fever and myalgias   Associated symptoms: no dizziness, no nausea and no vomiting   Chest Pain Associated symptoms: fever and headache   Associated symptoms: no dizziness, no nausea, no palpitations, no shortness of breath and no vomiting   Sore Throat Associated symptoms include chest pain and headaches. Pertinent negatives include no shortness of breath.  Illness Severity:  Moderate Onset quality:  Gradual Duration:  4 days Timing:  Constant Progression:  Worsening Chronicity:  New Associated symptoms: chest pain, congestion, fever, headaches and myalgias   Associated symptoms: no nausea, no rhinorrhea, no shortness of breath, no vomiting and no wheezing        Past Medical History:  Diagnosis Date  . AMA (advanced maternal age) multigravida 35+   . Anemia   . Anxiety    celexa  . Diabetes mellitus without complication (HCC) 2016   current/last pregnancy    Patient Active Problem List   Diagnosis Date Noted  . NSVD (normal spontaneous vaginal delivery) 02/16/2015  . Gestational diabetes 02/15/2015  . [redacted] weeks gestation of pregnancy   . Supervision of high risk pregnancy in third trimester 01/06/2015  . GDM (gestational diabetes mellitus) 01/06/2015  . History of pregnancy induced hypertension 01/06/2015  . Language barrier 01/06/2015  . 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 Anemia 01/06/2015  . AMA (advanced maternal age)  multigravida 35+     History reviewed. No pertinent surgical history.   OB History    Gravida  6   Para  5   Term  5   Preterm      AB  1   Living  5     SAB  1   IAB      Ectopic      Multiple  0   Live Births  5           Family History  Problem Relation Age of Onset  . Diabetes Mother   . Diabetes Father     Social History   Tobacco Use  . Smoking status: Never Smoker  . Smokeless tobacco: Never Used  Substance Use Topics  . Alcohol use: No  . Drug use: No    Home Medications Prior to Admission medications   Medication Sig Start Date End Date Taking? Authorizing Provider  benzonatate (TESSALON) 100 MG capsule Take 1 capsule (100 mg total) by mouth every 8 (eight) hours. 04/01/21  Yes Melene Plan, DO  ondansetron (ZOFRAN ODT) 4 MG disintegrating tablet 4mg  ODT q4 hours prn nausea/vomit 04/01/21  Yes 06/01/21, DO  pantoprazole (PROTONIX) 20 MG tablet Take 1 tablet (20 mg total) by mouth daily. 06/28/17   06/30/17, PA-C    Allergies    Patient has no known allergies.  Review of Systems   Review of Systems  Constitutional: Positive for chills and fever.  HENT: Positive for congestion. Negative for rhinorrhea.   Eyes: Negative for redness  and visual disturbance.  Respiratory: Negative for shortness of breath and wheezing.   Cardiovascular: Positive for chest pain. Negative for palpitations.  Gastrointestinal: Negative for nausea and vomiting.  Genitourinary: Negative for dysuria and urgency.  Musculoskeletal: Positive for myalgias. Negative for arthralgias.  Skin: Negative for pallor and wound.  Neurological: Positive for headaches. Negative for dizziness.    Physical Exam Updated Vital Signs BP (!) 164/96 (BP Location: Right Arm)   Pulse 88   Temp 100 F (37.8 C) (Oral)   Resp 17   Ht 5\' 5"  (1.651 m)   Wt 77.1 kg   LMP 03/02/2021   SpO2 99%   BMI 28.29 kg/m   Physical Exam Vitals and nursing note reviewed.  Constitutional:       General: She is not in acute distress.    Appearance: She is well-developed. She is not diaphoretic.  HENT:     Head: Normocephalic and atraumatic.  Eyes:     Pupils: Pupils are equal, round, and reactive to light.  Cardiovascular:     Rate and Rhythm: Regular rhythm. Tachycardia present.     Heart sounds: No murmur heard. No friction rub. No gallop.   Pulmonary:     Effort: Pulmonary effort is normal.     Breath sounds: No wheezing or rales.  Abdominal:     General: There is no distension.     Palpations: Abdomen is soft.     Tenderness: There is no abdominal tenderness.  Musculoskeletal:        General: No tenderness.     Cervical back: Normal range of motion and neck supple.  Skin:    General: Skin is warm and dry.  Neurological:     Mental Status: She is alert and oriented to person, place, and time.  Psychiatric:        Behavior: Behavior normal.     ED Results / Procedures / Treatments   Labs (all labs ordered are listed, but only abnormal results are displayed) Labs Reviewed  BASIC METABOLIC PANEL - Abnormal; Notable for the following components:      Result Value   Potassium 3.3 (*)    All other components within normal limits  CBC - Abnormal; Notable for the following components:   WBC 14.4 (*)    RBC 5.17 (*)    All other components within normal limits  RESP PANEL BY RT-PCR (FLU A&B, COVID) ARPGX2  GROUP A STREP BY PCR  I-STAT BETA HCG BLOOD, ED ([facility], [facility], [facility] ONLY)  TROPONIN I (HIGH SENSITIVITY)    EKG EKG Interpretation  Date/Time:  Tuesday Mar 31 2021 21:54:40 EDT Ventricular Rate:  111 PR Interval:  148 QRS Duration: 72 QT Interval:  320 QTC Calculation: 435 R Axis:   85 Text Interpretation: Sinus tachycardia T wave abnormality, consider inferior ischemia Abnormal ECG Otherwise no significant change Confirmed by [id number] [phone]) on 04/01/2021 4:26:55 AM   Radiology DG Chest 2 View  Result Date: 03/31/2021 CLINICAL DATA:  Chest pain, headache,  fever EXAM: CHEST - 2 VIEW COMPARISON:  07/24/2007 FINDINGS: The heart size and mediastinal contours are within normal limits. Both lungs are clear. The visualized skeletal structures are unremarkable. IMPRESSION: Negative. Electronically Signed   By: 07/26/2007 M.D.   On: 03/31/2021 22:25    Procedures Procedures   Medications Ordered in ED Medications  acetaminophen (TYLENOL) tablet 1,000 mg (has no administration in time range)    ED Course  I have reviewed the triage vital signs  and the nursing notes.  Pertinent labs & imaging results that were available during my care of the patient were reviewed by me and considered in my medical decision making (see chart for details).    MDM Rules/Calculators/A&P                          45 yo F with a chief complaints of cough congestion fevers chills myalgias and sore throat going on for about 4 days.  Well-appearing and nontoxic.  Mildly tachycardic on my evaluation in the room.  Clear lung sounds no abdominal tenderness.  Chest x-ray viewed by me without focal infiltrate.  COVID strep and influenza negative.  Very mild hypokalemia otherwise benign laboratory work-up including a negative troponin.  Will discharge the patient home.  PCP follow-up.  Symptomatic therapy.  4:40 AM:  I have discussed the diagnosis/risks/treatment options with the patient and believe the pt to be eligible for discharge home to follow-up with PCP. We also discussed returning to the ED immediately if new or worsening sx occur. We discussed the sx which are most concerning (e.g., sudden worsening pain, fever, inability to tolerate by mouth) that necessitate immediate return. Medications administered to the patient during their visit and any new prescriptions provided to the patient are listed below.  Medications given during this visit Medications  acetaminophen (TYLENOL) tablet 1,000 mg (has no administration in time range)     The patient appears reasonably screen  and/or stabilized for discharge and I doubt any other medical condition or other [facility] requiring further screening, evaluation, or treatment in the ED at this time prior to discharge.   Final Clinical Impression(s) / ED Diagnoses Final diagnoses:  Viral URI with cough    Rx / DC Orders ED Discharge Orders         Ordered    benzonatate (TESSALON) 100 MG capsule  Every 8 hours        04/01/21 0434    ondansetron (ZOFRAN ODT) 4 MG disintegrating tablet        04/01/21 0434           Melene Plan, DO 04/01/21 0440

## 2021-04-01 NOTE — ED Notes (Signed)
Spanish Interpreter [phone]

## 2021-06-17 ENCOUNTER — Other Ambulatory Visit: Payer: Self-pay

## 2021-06-17 DIAGNOSIS — N6320 Unspecified lump in the left breast, unspecified quadrant: Secondary | ICD-10-CM

## 2021-06-30 ENCOUNTER — Other Ambulatory Visit: Payer: Self-pay

## 2021-07-07 ENCOUNTER — Ambulatory Visit: Payer: Self-pay

## 2021-07-07 ENCOUNTER — Ambulatory Visit: Payer: Self-pay | Admitting: Family Medicine

## 2021-07-09 ENCOUNTER — Other Ambulatory Visit: Payer: Self-pay

## 2021-09-01 ENCOUNTER — Ambulatory Visit: Payer: Self-pay | Admitting: *Deleted

## 2021-09-01 ENCOUNTER — Other Ambulatory Visit: Payer: Self-pay

## 2021-09-01 ENCOUNTER — Ambulatory Visit: Payer: Self-pay

## 2021-09-01 VITALS — BP 134/86 | Wt 168.1 lb

## 2021-09-01 DIAGNOSIS — R87810 Cervical high risk human papillomavirus (HPV) DNA test positive: Secondary | ICD-10-CM

## 2021-09-01 DIAGNOSIS — Z1239 Encounter for other screening for malignant neoplasm of breast: Secondary | ICD-10-CM

## 2021-09-01 DIAGNOSIS — R87610 Atypical squamous cells of undetermined significance on cytologic smear of cervix (ASC-US): Secondary | ICD-10-CM
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
## 2021-09-01 NOTE — Patient Instructions (Addendum)
Explained breast self awareness with [name]. Patient did not need a Pap smear today due to last Pap smear was 04/22/2021. Explained the colposcopy the recommended follow-up for her abnormal Pap smear. Referred patient to the [facility] for [facility] for a colposcopy to follow-up for her abnormal Pap smear. Appointment scheduled Thursday, September 17, 2021 at 0815. Referred patient to the [facility] for a diagnostic mammogram. Appointment scheduled Tuesday, September 08, 2021 at 1240. Patient aware of appointment and will be there. Mercy Leppla verbalized understanding.  Zaniya Mcaulay, Kathaleen Maser, RN 1:45 PM

## 2021-09-03 ENCOUNTER — Ambulatory Visit: Payer: Self-pay

## 2021-09-08 ENCOUNTER — Ambulatory Visit
Admission: RE | Admit: 2021-09-08 | Discharge: 2021-09-08 | Disposition: A | Discharge status: Home / Self Care | Payer: Self-pay | Source: Ambulatory Visit | Attending: Obstetrics and Gynecology | Admitting: Obstetrics and Gynecology

## 2021-09-08 ENCOUNTER — Ambulatory Visit
Admission: RE | Admit: 2021-09-08 | Discharge: 2021-09-08 | Disposition: A | Discharge status: Home / Self Care | Payer: No Typology Code available for payment source | Source: Ambulatory Visit | Attending: Obstetrics and Gynecology | Admitting: Obstetrics and Gynecology

## 2021-09-08 DIAGNOSIS — N6320 Unspecified lump in the left breast, unspecified quadrant: Secondary | ICD-10-CM

## 2021-09-17 ENCOUNTER — Ambulatory Visit: Payer: Self-pay | Admitting: Family Medicine

## 2021-10-22 ENCOUNTER — Other Ambulatory Visit (HOSPITAL_COMMUNITY)
Admission: RE | Admit: 2021-10-22 | Discharge: 2021-10-22 | Disposition: A | Discharge status: Home / Self Care | Payer: No Typology Code available for payment source | Source: Ambulatory Visit | Attending: Obstetrics and Gynecology | Admitting: Obstetrics and Gynecology

## 2021-10-22 ENCOUNTER — Ambulatory Visit (INDEPENDENT_AMBULATORY_CARE_PROVIDER_SITE_OTHER): Payer: Self-pay | Admitting: Obstetrics and Gynecology

## 2021-10-22 ENCOUNTER — Other Ambulatory Visit: Payer: Self-pay

## 2021-10-22 VITALS — BP 148/96 | HR 70 | Wt 169.2 lb

## 2021-10-22 DIAGNOSIS — R6889 Other general symptoms and signs: Secondary | ICD-10-CM

## 2021-10-22 DIAGNOSIS — R87610 Atypical squamous cells of undetermined significance on cytologic smear of cervix (ASC-US): Secondary | ICD-10-CM | POA: Insufficient documentation

## 2021-10-22 DIAGNOSIS — R87810 Cervical high risk human papillomavirus (HPV) DNA test positive: Secondary | ICD-10-CM

## 2021-10-22 LAB — POCT PREGNANCY, URINE: Preg Test, Ur: NEGATIVE

## 2021-10-22 NOTE — Addendum Note (Signed)
Addended by: Cline Crock on: 10/22/2021 03:53 PM   Modules accepted: Orders

## 2021-10-22 NOTE — Progress Notes (Signed)
Patient given informed consent, signed copy in the chart, time out was performed. ASCUS pap with HR HPV noted from [facility] report.  UPT neg. Placed in lithotomy position. Cervix viewed with speculum and colposcope after application of acetic acid.   Colposcopy adequate?  no Acetowhite lesions?no Punctation?no Mosaicism?  no Abnormal vasculature?  no Biopsies? 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  Follow up per path results  Warden Fillers, MD

## 2021-10-28 LAB — SURGICAL PATHOLOGY

## 2021-10-29 ENCOUNTER — Telehealth: Payer: Self-pay | Admitting: *Deleted

## 2021-10-29 NOTE — Telephone Encounter (Signed)
-----   Message from Warden Fillers, MD sent at 10/29/2021  9:19 AM EST ----- No dysplasia noted, due to hx of HPV advise repap in 1 year

## 2021-10-29 NOTE — Telephone Encounter (Signed)
Called Kathleen Tucker with Interpreter [employer] Royal and notified of results and recommendations per Dr. Donavan Foil. Patient voices understanding. Jaiya Mooradian,RN

## 2021-11-01 NOTE — L&D Delivery Note (Addendum)
Delivery Note Called to room to assess patient. With good maternal effort at 10:08 PM a viable female was delivered via Vaginal, Spontaneous (Presentation: Left Occiput Anterior).  APGAR: , ; weight pending.   Placenta status: Placenta delivered immediately after delivery of baby, appears intact.  Pitocin was started immediately after placenta delivery. Cord: 3 vessels with the following complications: placental abruption  Anesthesia: Epidural Episiotomy: None Lacerations:  None Suture Repair:  none Est. Blood Loss (mL):  198  Mom to postpartum.  Baby to Couplet care / Skin to Skin.  Hollace Hayward, SNM 08/12/2022, 10:21 PM  The above was performed under my direct supervision and guidance.

## 2021-11-20 ENCOUNTER — Encounter (HOSPITAL_COMMUNITY): Payer: Self-pay | Admitting: Emergency Medicine

## 2021-11-20 ENCOUNTER — Emergency Department (HOSPITAL_COMMUNITY)
Admission: EM | Admit: 2021-11-20 | Discharge: 2021-11-20 | Disposition: A | Discharge status: Home / Self Care | Payer: No Typology Code available for payment source | Attending: Emergency Medicine | Admitting: Emergency Medicine

## 2021-11-20 ENCOUNTER — Emergency Department (HOSPITAL_COMMUNITY): Payer: No Typology Code available for payment source

## 2021-11-20 DIAGNOSIS — E119 Type 2 diabetes mellitus without complications: Secondary | ICD-10-CM | POA: Insufficient documentation

## 2021-11-20 DIAGNOSIS — R1084 Generalized abdominal pain: Secondary | ICD-10-CM | POA: Insufficient documentation

## 2021-11-20 LAB — COMPREHENSIVE METABOLIC PANEL
ALT: 29 U/L (ref 0–44)
AST: 24 U/L (ref 15–41)
Albumin: 3.9 g/dL (ref 3.5–5.0)
Alkaline Phosphatase: 55 U/L (ref 38–126)
Anion gap: 6 (ref 5–15)
BUN: 10 mg/dL (ref 6–20)
CO2: 26 mmol/L (ref 22–32)
Calcium: 8.9 mg/dL (ref 8.9–10.3)
Chloride: 105 mmol/L (ref 98–111)
Creatinine, Ser: 0.67 mg/dL (ref 0.44–1.00)
GFR, Estimated: >60 mL/min (ref >60)
Glucose, Bld: 85 mg/dL (ref 70–99)
Potassium: 4 mmol/L (ref 3.5–5.1)
Sodium: 137 mmol/L (ref 135–145)
Total Bilirubin: 0.7 mg/dL (ref 0.3–1.2)
Total Protein: 7 g/dL (ref 6.5–8.1)

## 2021-11-20 LAB — URINALYSIS, ROUTINE W REFLEX MICROSCOPIC
Bilirubin Urine: NEGATIVE
Glucose, UA: NEGATIVE mg/dL
Ketones, ur: NEGATIVE mg/dL
Leukocytes,Ua: NEGATIVE
Nitrite: NEGATIVE
Protein, ur: NEGATIVE mg/dL
Specific Gravity, Urine: 1.018 (ref 1.005–1.030)
pH: 5 (ref 5.0–8.0)

## 2021-11-20 LAB — CBC
HCT: 38.1 % (ref 36.0–46.0)
Hemoglobin: 12.5 g/dL (ref 12.0–15.0)
MCH: 26.9 pg (ref 26.0–34.0)
MCHC: 32.8 g/dL (ref 30.0–36.0)
MCV: 81.9 fL (ref 80.0–100.0)
Platelets: 276 10*3/uL (ref 150–400)
RBC: 4.65 MIL/uL (ref 3.87–5.11)
RDW: 13.6 % (ref 11.5–15.5)
WBC: 5 10*3/uL (ref 4.0–10.5)
nRBC: 0 % (ref 0.0–0.2)

## 2021-11-20 LAB — I-STAT BETA HCG BLOOD, ED (MC, WL, AP ONLY): I-stat hCG, quantitative: <5 m[IU]/mL (ref <5)

## 2021-11-20 LAB — LIPASE, BLOOD: Lipase: 41 U/L (ref 11–51)

## 2021-11-20 MED ORDER — IOHEXOL 300 MG/ML  SOLN
100.0000 mL | Freq: Once | INTRAMUSCULAR | Status: AC | PRN
  Administered 2021-11-20: 100 mL via INTRAVENOUS

## 2021-11-20 MED ORDER — PANTOPRAZOLE SODIUM 20 MG PO TBEC: 20.0000 mg | DELAYED_RELEASE_TABLET | Freq: Every day | ORAL | 0 refills | Status: DC

## 2021-11-20 NOTE — ED Provider Notes (Signed)
[facility] EMERGENCY DEPARTMENT Provider Note   CSN: [id number] Arrival date & time: 11/20/21  1338     History  Chief Complaint  Patient presents with   Abdominal Pain    Kathleen Tucker is a 46 y.o. female.   Abdominal Pain Pain location:  LUQ Pain quality: aching and cramping   Pain radiation: throught abdomen. Pain severity:  Moderate Onset quality:  Gradual Duration:  2 days Timing:  Constant Progression:  Worsening Chronicity:  New Context: not eating, not previous surgeries, not recent illness, not recent travel, not retching and not trauma   Context comment:  Made worse with palpation and bearing down Relieved by:  NSAIDs Exacerbated by: as above. Ineffective treatments: rest. Associated symptoms: no anorexia, no chills, no constipation, no nausea and no vomiting   This is a 46 yo female with a pmh of anemia, anxiety, DM who presents to our ED with a complaint of abdominal pain.  Patient also reports that she expressed white serosanguineous fluid from her bellybutton prior to arrival.  Patient denies that she has had trauma, recent surgery, nausea vomiting or diarrhea.  Patient denies having recent sick contacts.  Patient has not felt abdominal pain similar to this in the past.  When asked where her abdominal pain was the patient initially pointed to her left upper quadrant however on exam she has pain throughout her abdomen.     Home Medications Prior to Admission medications   Medication Sig Start Date End Date Taking? Authorizing Provider  pantoprazole (PROTONIX) 20 MG tablet Take 1 tablet (20 mg total) by mouth daily. 11/20/21  Yes Camila Li, MD  benzonatate (TESSALON) 100 MG capsule Take 1 capsule (100 mg total) by mouth every 8 (eight) hours. Patient not taking: Reported on 10/22/2021 04/01/21   Melene Plan, DO  Multiple Vitamin (MULTIVITAMIN) tablet Take 1 tablet by mouth daily. Patient not taking: Reported on 10/22/2021     [provider]  norethindrone (MICRONOR) 0.35 MG tablet Take 1 tablet by mouth daily. Patient not taking: Reported on 10/22/2021    [provider]  ondansetron (ZOFRAN ODT) 4 MG disintegrating tablet 4mg  ODT q4 hours prn nausea/vomit Patient not taking: Reported on 10/22/2021 04/01/21   06/01/21, DO  pantoprazole (PROTONIX) 20 MG tablet Take 1 tablet (20 mg total) by mouth daily. Patient not taking: Reported on 10/22/2021 06/28/17   06/30/17, PA-C      Allergies    Patient has no known allergies.    Review of Systems   Review of Systems  Constitutional:  Negative for chills.  Gastrointestinal:  Positive for abdominal pain. Negative for anorexia, constipation, nausea and vomiting.  All other systems reviewed and are negative.  Physical Exam Updated Vital Signs BP (!) 161/97    Pulse 76    Temp (!) 97.5 F (36.4 C) (Oral)    Resp 18    SpO2 99%  Physical Exam Vitals and nursing note reviewed.  Constitutional:      General: She is not in acute distress.    Appearance: She is well-developed.  HENT:     Head: Normocephalic and atraumatic.  Eyes:     Conjunctiva/sclera: Conjunctivae normal.  Cardiovascular:     Rate and Rhythm: Normal rate and regular rhythm.     Heart sounds: No murmur heard. Pulmonary:     Effort: Pulmonary effort is normal. No respiratory distress.     Breath sounds: Normal breath sounds.  Abdominal:  General: Bowel sounds are normal.     Palpations: Abdomen is soft.     Tenderness: There is generalized abdominal tenderness.  Musculoskeletal:        General: No swelling.     Cervical back: Neck supple.     Right lower leg: No edema.     Left lower leg: No edema.  Skin:    General: Skin is warm and dry.     Capillary Refill: Capillary refill takes less than 2 seconds.  Neurological:     Mental Status: She is alert.     Cranial Nerves: No cranial nerve deficit.     Sensory: No sensory deficit.     Motor: No weakness.   Psychiatric:        Mood and Affect: Mood normal.    ED Results / Procedures / Treatments   Labs (all labs ordered are listed, but only abnormal results are displayed) Labs Reviewed  URINALYSIS, ROUTINE W REFLEX MICROSCOPIC - Abnormal; Notable for the following components:      Result Value   Hgb urine dipstick MODERATE (*)    Bacteria, UA RARE (*)    All other components within normal limits  LIPASE, BLOOD  COMPREHENSIVE METABOLIC PANEL  CBC  I-STAT BETA HCG BLOOD, ED ([facility], [facility], [facility] ONLY)    EKG None  Radiology CT ABDOMEN PELVIS W CONTRAST  Result Date: 11/20/2021 CLINICAL DATA:  Abdominal pain, acute, nonlocalized draining lesion in belly button EXAM: CT ABDOMEN AND PELVIS WITH CONTRAST TECHNIQUE: Multidetector CT imaging of the abdomen and pelvis was performed using the standard protocol following bolus administration of intravenous contrast. RADIATION DOSE REDUCTION: This exam was performed according to the departmental dose-optimization program which includes automated exposure control, adjustment of the mA and/or kV according to patient size and/or use of iterative reconstruction technique. CONTRAST:  100mL OMNIPAQUE IOHEXOL 300 MG/ML  SOLN COMPARISON:  None. FINDINGS: Lower chest: No acute abnormality. Hepatobiliary: No focal hepatic abnormality. Gallbladder unremarkable. Pancreas: No focal abnormality or ductal dilatation. Spleen: No focal abnormality.  Normal size. Adrenals/Urinary Tract: No adrenal abnormality. No focal renal abnormality. No stones or hydronephrosis. Urinary bladder is unremarkable. Stomach/Bowel: Stomach, large and small bowel grossly unremarkable. Normal appendix. Vascular/Lymphatic: No evidence of aneurysm or adenopathy. Reproductive: Uterus and adnexa unremarkable.  No mass. Other: No free fluid or free air. No visible abnormality in the umbilicus region. Musculoskeletal: No acute bony abnormality. IMPRESSION: No acute findings in the abdomen or pelvis. No  visible fluid collection or other abnormality in the umbilicus. Electronically Signed   By: Charlett NoseKevin  Dover M.D.   On: 11/20/2021 20:07    Procedures Procedures    Medications Ordered in ED Medications  iohexol (OMNIPAQUE) 300 MG/ML solution 100 mL (100 mLs Intravenous Contrast Given 11/20/21 2003)    ED Course/ Medical Decision Making/ A&P                           Medical Decision Making Amount and/or Complexity of Data Reviewed External Data Reviewed: notes. Labs: ordered. Radiology: ordered and independent interpretation performed.  Risk Prescription drug management.   This is a 46 year old female who presents to emergency department for evaluation of abdominal pain.  Differential diagnosis includes but is not limited to the following: Pancreatitis, cholecystitis, appendicitis, bowel obstruction, ovarian pathology, gastritis.  Will obtain further lab work to differentiate the cause of the patient's abdominal pain.  Lab work-up was reviewed independently CMP within normal limits, CBC was without  leukocytosis, lipase within normal limits, urine without signs of infection.  Due to the patient reporting discharge from her umbilicus will obtain CT abdomen and pelvis for futher evaluation. This was independently reviewed and did not reveal any acute abnormalities.   Given her overall unremarkable workup I suspect her pain is likely related to gastritis. Will discharge with Protonix prescription and instructions to follow up in 24-48 hours for repeat abdominal exam.     Final Clinical Impression(s) / ED Diagnoses Final diagnoses:  Generalized abdominal pain    Rx / DC Orders ED Discharge Orders          Ordered    pantoprazole (PROTONIX) 20 MG tablet  Daily        11/20/21 2032              Camila Li, MD 11/21/21 Marlyne Beards    Pricilla Loveless, MD 11/23/21 [phone]

## 2021-11-20 NOTE — ED Provider Triage Note (Signed)
Emergency Medicine Provider Triage Evaluation Note  Kathleen Tucker , a 46 y.o. female  was evaluated in triage.  Pt complains of abdominal pain  Review of Systems  Positive: Diffuse abdominal pain Negative: fever  Physical Exam  BP (!) 154/94 (BP Location: Right Arm)    Pulse 73    Temp 98.6 F (37 C) (Oral)    Resp 16    SpO2 100%  Gen:   Awake, no distress   Resp:  Normal effort  MSK:   Moves extremities without difficulty  Other:    Medical Decision Making  Medically screening exam initiated at 2:33 PM.  Appropriate orders placed.  [name] was informed that the remainder of the evaluation will be completed by another provider, this initial triage assessment does not replace that evaluation, and the importance of remaining in the ED until their evaluation is complete.     Elson Areas, [place] 11/20/21 1434

## 2021-11-20 NOTE — ED Triage Notes (Signed)
Patient here with complaint of abdominal pain that started several days ago. Patient describes pain as a diffuse burning and aching. Denies vomiting and diarrhea. Patient alert, oriented, ambulatory, and in no apparent distress at this time.

## 2021-11-20 NOTE — Discharge Instructions (Addendum)
Your lab work-up here in the emergency department and imaging was not evident of acute abdominal infection or process that requires surgery.  Will recommend that you treat your pain with Tylenol and ibuprofen at home.  Please follow-up with your primary care provider.  For repeat abdominal exam in 24 to 48 hours if you were to continue to having worsening abdominal pain please come back to the emergency department

## 2021-12-11 ENCOUNTER — Ambulatory Visit: Payer: No Typology Code available for payment source | Admitting: Obstetrics and Gynecology

## 2021-12-21 LAB — PREGNANCY, URINE: Preg Test, Ur: POSITIVE

## 2022-01-15 ENCOUNTER — Encounter: Payer: Self-pay | Admitting: *Deleted

## 2022-01-21 ENCOUNTER — Telehealth: Payer: No Typology Code available for payment source

## 2022-01-21 ENCOUNTER — Telehealth: Payer: Self-pay

## 2022-01-21 NOTE — Telephone Encounter (Signed)
Called Pt using [address] [place] [facility] id# [phone] to start her New OB Intake, no answer, VM not set up. ?

## 2022-01-27 ENCOUNTER — Ambulatory Visit (INDEPENDENT_AMBULATORY_CARE_PROVIDER_SITE_OTHER): Payer: Self-pay | Admitting: Family Medicine

## 2022-01-27 ENCOUNTER — Other Ambulatory Visit (HOSPITAL_COMMUNITY)
Admission: RE | Admit: 2022-01-27 | Discharge: 2022-01-27 | Disposition: A | Discharge status: Home / Self Care | Payer: No Typology Code available for payment source | Source: Ambulatory Visit | Attending: Family Medicine | Admitting: Family Medicine

## 2022-01-27 ENCOUNTER — Encounter: Payer: Self-pay | Admitting: Family Medicine

## 2022-01-27 VITALS — BP 135/87 | HR 77 | Wt 174.3 lb

## 2022-01-27 DIAGNOSIS — O24419 Gestational diabetes mellitus in pregnancy, unspecified control: Secondary | ICD-10-CM

## 2022-01-27 DIAGNOSIS — O09529 Supervision of elderly multigravida, unspecified trimester: Secondary | ICD-10-CM

## 2022-01-27 DIAGNOSIS — Z8632 Personal history of gestational diabetes: Secondary | ICD-10-CM

## 2022-01-27 DIAGNOSIS — O10919 Unspecified pre-existing hypertension complicating pregnancy, unspecified trimester: Secondary | ICD-10-CM

## 2022-01-27 DIAGNOSIS — O0990 Supervision of high risk pregnancy, unspecified, unspecified trimester: Secondary | ICD-10-CM

## 2022-01-27 HISTORY — DX: Gestational diabetes mellitus in pregnancy, unspecified control: O24.419

## 2022-01-27 MED ORDER — ASPIRIN EC 81 MG PO TBEC: 81.0000 mg | DELAYED_RELEASE_TABLET | Freq: Every day | ORAL | 2 refills | Status: DC

## 2022-01-27 MED ORDER — LABETALOL HCL 200 MG PO TABS: 200.0000 mg | ORAL_TABLET | Freq: Two times a day (BID) | ORAL | 3 refills | Status: DC

## 2022-01-27 NOTE — Progress Notes (Signed)
?Subjective:  ?Kathleen Tucker is a [id number] [redacted]w[redacted]d by LMP being seen today for her first obstetrical visit.  Her obstetrical history is significant for  5 vaginal deliveries. Had GDM with last pregnancy - oral hypoglycemics. Since then has developed CHTN. Not currently on medications. Risk fators also include AMA . Patient does intend to breast feed. Pregnancy history fully reviewed. ? ?Patient reports no complaints. ? ?BP 135/87   Pulse 77   Wt 174 lb 4.8 oz (79.1 kg)   LMP 11/04/2021 Comment: spotting when not on period  BMI 29.01 kg/m?  ? ?HISTORY: ?OB History  ?Gravida Para Term Preterm AB Living  ?$Remove'7 5 5   1 5  'DQodWsM$ ?SAB IAB Ectopic Multiple Live Births  ?1     0 5  ?  ?# Outcome Date GA Lbr Len/2nd Weight Sex Delivery Anes PTL Lv  ?7 Current           ?6 Term 02/16/15 [redacted]w[redacted]d 01:06 / 00:02 7 lb 8.1 oz (3.405 kg) F Vag-Spont None  LIV  ?5 Term 10/14/10 [redacted]w[redacted]d  7 lb (3.175 kg) F Vag-Spont  N LIV  ?   Complications: PIH (pregnancy induced hypertension)  ?4 Term 08/30/03 [redacted]w[redacted]d  7 lb (3.175 kg) M Vag-Spont  N LIV  ?3 Term 02/04/01 [redacted]w[redacted]d  6 lb 13 oz (3.09 kg) F Vag-Spont  N LIV  ?2 Term 06/13/98 [redacted]w[redacted]d  7 lb (3.175 kg) F Vag-Spont EPI  LIV  ?1 SAB           ? ? ?Past Medical History:  ?Diagnosis Date  ? AMA (advanced maternal age) multigravida 75+   ? Anemia   ? Anxiety   ? celexa  ? Diabetes mellitus without complication ([place]) 6063  ? current/last pregnancy  ? ? ?History reviewed. No pertinent surgical history. ? ?Family History  ?Problem Relation Age of Onset  ? Diabetes Mother   ? Diabetes Father   ? ? ? ?Exam  ?BP 135/87   Pulse 77   Wt 174 lb 4.8 oz (79.1 kg)   LMP 11/04/2021 Comment: spotting when not on period  BMI 29.01 kg/m?  ? ?Chaperone present during exam ? ?CONSTITUTIONAL: Well-developed, well-nourished female in no acute distress.  ?HENT:  Normocephalic, atraumatic, External right and left ear normal. Oropharynx is clear and moist ?EYES: Conjunctivae and EOM are normal. Pupils are equal, round, and  reactive to light. No scleral icterus.  ?NECK: Normal range of motion, supple, no masses.  Normal thyroid.  ?CARDIOVASCULAR: Normal heart rate noted, regular rhythm ?RESPIRATORY: Clear to auscultation bilaterally. Effort and breath sounds normal, no problems with respiration noted. ?BREASTS: deferred ?ABDOMEN: Soft, normal bowel sounds, no distention noted.  No tenderness, rebound or guarding.  ?PELVIC: deferred ?MUSCULOSKELETAL: Normal range of motion. No tenderness.  No cyanosis, clubbing, or edema.  2+ distal pulses. ?SKIN: Skin is warm and dry. No rash noted. Not diaphoretic. No erythema. No pallor. ?NEUROLOGIC: Alert and oriented to person, place, and time. Normal reflexes, muscle tone coordination. No cranial nerve deficit noted. ?PSYCHIATRIC: Normal mood and affect. Normal behavior. Normal judgment and thought content. ? ?  ?Assessment:  ? ? Pregnancy: [id number] ?Patient Active Problem List  ? Diagnosis Date Noted  ? Supervision of high risk pregnancy, antepartum 01/27/2022  ? AMA (advanced maternal age) multigravida 18+, unspecified trimester 01/27/2022  ? Chronic hypertension affecting pregnancy 01/27/2022  ? ASCUS with positive high risk HPV cervical 10/22/2021  ? NSVD (normal spontaneous vaginal delivery) 02/16/2015  ? Gestational diabetes 02/15/2015  ?  [redacted] weeks gestation of pregnancy   ? Supervision of high risk pregnancy in third trimester 01/06/2015  ? GDM (gestational diabetes mellitus) 01/06/2015  ? History of pregnancy induced hypertension 01/06/2015  ? Language barrier 01/06/2015  ? Hemoglobinuria 01/06/2015  ? Anemia 01/06/2015  ? AMA (advanced maternal age) multigravida 41+   ? ? ?  ?Plan:  ? ?1. Supervision of high risk pregnancy, antepartum ?FHT and FH normal ?- Culture, OB Urine ?- GC/Chlamydia probe amp ([place])not at [facility] ?- CBC/D/Plt+RPR+Rh+ABO+RubIgG... ?- [place] MFM OB DETAIL +14 WK; Future ?- Hemoglobin A1c ? ?2. Chronic hypertension affecting pregnancy ?Start labetalol $RemoveBeforeDE'200mg'PYIWfvSgKMwFgBV$  BID ?ASA  $Rem'81mg'jtmO$  ?- [place] MFM OB DETAIL +14 WK; Future ?- Comp Met (CMET) ?- Protein / creatinine ratio, urine ? ?3. AMA (advanced maternal age) multigravida 7+, unspecified trimester ?Declines panorama - no insurance. ?- [place] MFM OB DETAIL +14 WK; Future ?- Comp Met (CMET) ?- Protein / creatinine ratio, urine ? ?4. History of gestational diabetes mellitus (GDM) ?HgA1c today ? ?  ?Initial labs obtained ?Continue prenatal vitamins ?Reviewed n/v relief measures and warning s/s to report ?Reviewed recommended weight gain based on pre-gravid BMI ?Encouraged well-balanced diet ?Ultrasound discussed; fetal survey: requested ?CCNC completed> form faxed if has or is planning to apply for medicaid ?The nature of [place] for [facility] with multiple MDs and other Advanced Practice Providers was explained to patient; also emphasized that fellows, residents, and students are part of our team. ? ? ?Problem list reviewed and updated. ?75% of 30 min visit spent on counseling and coordination of care.  ?  ? ?Truett Mainland ?01/27/2022 ?

## 2022-01-28 ENCOUNTER — Inpatient Hospital Stay (HOSPITAL_COMMUNITY)
Admission: AD | Admit: 2022-01-28 | Discharge: 2022-01-29 | Disposition: A | Discharge status: Home / Self Care | Payer: Self-pay | Attending: Obstetrics & Gynecology | Admitting: Obstetrics & Gynecology

## 2022-01-28 DIAGNOSIS — R1030 Lower abdominal pain, unspecified: Secondary | ICD-10-CM | POA: Insufficient documentation

## 2022-01-28 DIAGNOSIS — W182XXA Fall in (into) shower or empty bathtub, initial encounter: Secondary | ICD-10-CM | POA: Insufficient documentation

## 2022-01-28 DIAGNOSIS — O09521 Supervision of elderly multigravida, first trimester: Secondary | ICD-10-CM | POA: Insufficient documentation

## 2022-01-28 DIAGNOSIS — Y93E1 Activity, personal bathing and showering: Secondary | ICD-10-CM | POA: Insufficient documentation

## 2022-01-28 DIAGNOSIS — O26891 Other specified pregnancy related conditions, first trimester: Secondary | ICD-10-CM | POA: Insufficient documentation

## 2022-01-28 DIAGNOSIS — Z3A12 12 weeks gestation of pregnancy: Secondary | ICD-10-CM | POA: Insufficient documentation

## 2022-01-28 DIAGNOSIS — O209 Hemorrhage in early pregnancy, unspecified: Secondary | ICD-10-CM | POA: Insufficient documentation

## 2022-01-28 DIAGNOSIS — Z3492 Encounter for supervision of normal pregnancy, unspecified, second trimester: Secondary | ICD-10-CM

## 2022-01-28 DIAGNOSIS — O0990 Supervision of high risk pregnancy, unspecified, unspecified trimester: Secondary | ICD-10-CM

## 2022-01-28 LAB — COMPREHENSIVE METABOLIC PANEL
ALT: 21 IU/L (ref 0–32)
AST: 16 IU/L (ref 0–40)
Albumin/Globulin Ratio: 1.4 (ref 1.2–2.2)
Albumin: 4.2 g/dL (ref 3.8–4.8)
Alkaline Phosphatase: 49 IU/L (ref 44–121)
BUN/Creatinine Ratio: 16 (ref 9–23)
BUN: 11 mg/dL (ref 6–24)
Bilirubin Total: 0.3 mg/dL (ref 0.0–1.2)
CO2: 20 mmol/L (ref 20–29)
Calcium: 9.4 mg/dL (ref 8.7–10.2)
Chloride: 100 mmol/L (ref 96–106)
Creatinine, Ser: 0.68 mg/dL (ref 0.57–1.00)
Globulin, Total: 3 g/dL (ref 1.5–4.5)
Glucose: 82 mg/dL (ref 70–99)
Potassium: 4 mmol/L (ref 3.5–5.2)
Sodium: 135 mmol/L (ref 134–144)
Total Protein: 7.2 g/dL (ref 6.0–8.5)
eGFR: 111 mL/min/{1.73_m2} (ref >59)

## 2022-01-28 LAB — CBC/D/PLT+RPR+RH+ABO+RUBIGG...
Antibody Screen: NEGATIVE
Basophils Absolute: 0 10*3/uL (ref 0.0–0.2)
Basos: 0 %
EOS (ABSOLUTE): 0.2 10*3/uL (ref 0.0–0.4)
Eos: 3 %
HCV Ab: NONREACTIVE
HIV Screen 4th Generation wRfx: NONREACTIVE
Hematocrit: 38.8 % (ref 34.0–46.6)
Hemoglobin: 12.6 g/dL (ref 11.1–15.9)
Hepatitis B Surface Ag: NEGATIVE
Immature Grans (Abs): 0 10*3/uL (ref 0.0–0.1)
Immature Granulocytes: 0 %
Lymphocytes Absolute: 1.7 10*3/uL (ref 0.7–3.1)
Lymphs: 24 %
MCH: 26.3 pg — ABNORMAL LOW (ref 26.6–33.0)
MCHC: 32.5 g/dL (ref 31.5–35.7)
MCV: 81 fL (ref 79–97)
Monocytes Absolute: 0.6 10*3/uL (ref 0.1–0.9)
Monocytes: 8 %
Neutrophils Absolute: 4.7 10*3/uL (ref 1.4–7.0)
Neutrophils: 65 %
Platelets: 271 10*3/uL (ref 150–450)
RBC: 4.79 x10E6/uL (ref 3.77–5.28)
RDW: 13.7 % (ref 11.7–15.4)
RPR Ser Ql: NONREACTIVE
Rh Factor: POSITIVE
Rubella Antibodies, IGG: 2.92 index (ref >0.99)
WBC: 7.3 10*3/uL (ref 3.4–10.8)

## 2022-01-28 LAB — HEMOGLOBIN A1C
Est. average glucose Bld gHb Est-mCnc: 120 mg/dL
Hgb A1c MFr Bld: 5.8 % — ABNORMAL HIGH (ref 4.8–5.6)

## 2022-01-28 LAB — HCV INTERPRETATION

## 2022-01-28 LAB — GC/CHLAMYDIA PROBE AMP (~~LOC~~) NOT AT ARMC
Chlamydia: NEGATIVE
Comment: NEGATIVE
Comment: NORMAL
Neisseria Gonorrhea: NEGATIVE

## 2022-01-28 LAB — PROTEIN / CREATININE RATIO, URINE
Creatinine, Urine: 108.5 mg/dL
Protein, Ur: 7.7 mg/dL
Protein/Creat Ratio: 71 mg/g creat (ref 0–200)

## 2022-01-28 NOTE — MAU Note (Signed)
PT SAYS WITH INTERPRETER-[id number] ?HAS LOWER ABD PAIN- STARTED TODAY- AFTER  ?SHE FELL IN SHOWER AT 4. ?VB STARTED TOO- WHEN SHE WIPES  ?ALSO HAS VAG BURNING  ?IN TRIAGE- LESS ABD PAIN NOW ?                     VB - STILL ONLY WHEN SHE  ?                            WIPES ?                       BURNING - SAME AS BEFORE ?

## 2022-01-29 ENCOUNTER — Inpatient Hospital Stay (HOSPITAL_COMMUNITY): Payer: No Typology Code available for payment source

## 2022-01-29 DIAGNOSIS — Z3A12 12 weeks gestation of pregnancy: Secondary | ICD-10-CM

## 2022-01-29 DIAGNOSIS — Z3492 Encounter for supervision of normal pregnancy, unspecified, second trimester: Secondary | ICD-10-CM

## 2022-01-29 LAB — URINALYSIS, ROUTINE W REFLEX MICROSCOPIC
Bacteria, UA: NONE SEEN
Bilirubin Urine: NEGATIVE
Glucose, UA: NEGATIVE mg/dL
Ketones, ur: 5 mg/dL — AB
Leukocytes,Ua: NEGATIVE
Nitrite: NEGATIVE
Protein, ur: NEGATIVE mg/dL
Specific Gravity, Urine: 1.028 (ref 1.005–1.030)
pH: 5 (ref 5.0–8.0)

## 2022-01-29 LAB — URINE CULTURE, OB REFLEX: Organism ID, Bacteria: NO GROWTH

## 2022-01-29 LAB — CBC
HCT: 34.7 % — ABNORMAL LOW (ref 36.0–46.0)
Hemoglobin: 11.8 g/dL — ABNORMAL LOW (ref 12.0–15.0)
MCH: 27.1 pg (ref 26.0–34.0)
MCHC: 34 g/dL (ref 30.0–36.0)
MCV: 79.6 fL — ABNORMAL LOW (ref 80.0–100.0)
Platelets: 271 10*3/uL (ref 150–400)
RBC: 4.36 MIL/uL (ref 3.87–5.11)
RDW: 14 % (ref 11.5–15.5)
WBC: 8.8 10*3/uL (ref 4.0–10.5)
nRBC: 0 % (ref 0.0–0.2)

## 2022-01-29 LAB — WET PREP, GENITAL
Clue Cells Wet Prep HPF POC: NONE SEEN
Sperm: NONE SEEN
Trich, Wet Prep: NONE SEEN
WBC, Wet Prep HPF POC: <10 (ref <10)
Yeast Wet Prep HPF POC: NONE SEEN

## 2022-01-29 LAB — CULTURE, OB URINE

## 2022-01-29 NOTE — MAU Provider Note (Signed)
?History  ?  ? ?CSN: [id number] ? ?Arrival date and time: 01/28/22 2300 ? ? Event Date/Time  ? First Provider Initiated Contact with Patient 01/29/22 0019   ?  ? ?Chief Complaint  ?Patient presents with  ? Fall  ? ?HPI ?Kathleen Tucker is a 46 y.o. [id number] at [redacted]w[redacted]d who presents with vaginal bleeding. She reports she fell in the shower at 1600. She reports she has some intermittent lower abdominal pain. She reports she sees bleeding when she wipes. She also reports vaginal burning. She was seen at the office yesterday for her new OB.  ? ?OB History   ? ? Gravida  ?7  ? Para  ?5  ? Term  ?5  ? Preterm  ?   ? AB  ?1  ? Living  ?5  ?  ? ? SAB  ?1  ? IAB  ?   ? Ectopic  ?   ? Multiple  ?0  ? Live Births  ?5  ?   ?  ?  ? ? ?Past Medical History:  ?Diagnosis Date  ? AMA (advanced maternal age) multigravida 45+   ? Anemia   ? Anxiety   ? celexa  ? Diabetes mellitus without complication ([place]) [id number]  ? current/last pregnancy  ? ? ?No past surgical history on file. ? ?Family History  ?Problem Relation Age of Onset  ? Diabetes Mother   ? Diabetes Father   ? ? ?Social History  ? ?Tobacco Use  ? Smoking status: Never  ? Smokeless tobacco: Never  ?Vaping Use  ? Vaping Use: Never used  ?Substance Use Topics  ? Alcohol use: No  ? Drug use: No  ? ? ?Allergies: No Known Allergies ? ?Medications Prior to Admission  ?Medication Sig Dispense Refill Last Dose  ? aspirin EC 81 MG tablet Take 1 tablet (81 mg total) by mouth daily. Take after 12 weeks for prevention of preeclampsia later in pregnancy 300 tablet 2   ? labetalol (NORMODYNE) 200 MG tablet Take 1 tablet (200 mg total) by mouth 2 (two) times daily. 60 tablet 3   ? prenatal vitamin w/FE, FA (PRENATAL 1 + 1) 27-1 MG TABS tablet Take 1 tablet by mouth daily at 12 noon.     ? ? ?Review of Systems  ?Constitutional: Negative.  Negative for fatigue and fever.  ?HENT: Negative.    ?Respiratory: Negative.  Negative for shortness of breath.   ?Cardiovascular: Negative.  Negative for  chest pain.  ?Gastrointestinal:  Positive for abdominal pain. Negative for constipation, diarrhea, nausea and vomiting.  ?Genitourinary:  Positive for vaginal bleeding and vaginal pain. Negative for dysuria.  ?Neurological: Negative.  Negative for dizziness and headaches.  ?Physical Exam  ? ?Blood pressure 136/81, pulse 74, temperature 98.3 ?F (36.8 ?C), temperature source Oral, resp. rate 20, height 5\' 2"  (1.575 m), weight 79 kg, last menstrual period 11/04/2021, unknown if currently breastfeeding. ? ?Physical Exam ?Vitals and nursing note reviewed.  ?Constitutional:   ?   General: She is not in acute distress. ?   Appearance: She is well-developed.  ?HENT:  ?   Head: Normocephalic.  ?Eyes:  ?   Pupils: Pupils are equal, round, and reactive to light.  ?Cardiovascular:  ?   Rate and Rhythm: Normal rate and regular rhythm.  ?   Heart sounds: Normal heart sounds.  ?Pulmonary:  ?   Effort: Pulmonary effort is normal. No respiratory distress.  ?   Breath sounds: Normal breath sounds.  ?Abdominal:  ?  General: Bowel sounds are normal. There is no distension.  ?   Palpations: Abdomen is soft.  ?   Tenderness: There is no abdominal tenderness.  ?Skin: ?   General: Skin is warm and dry.  ?Neurological:  ?   Mental Status: She is alert and oriented to person, place, and time.  ?Psychiatric:     ?   Mood and Affect: Mood normal.     ?   Behavior: Behavior normal.     ?   Thought Content: Thought content normal.     ?   Judgment: Judgment normal.  ? ? ?MAU Course  ?Procedures ?Results for orders placed or performed during the hospital encounter of 01/28/22 (from the past 24 hour(s))  ?CBC     Status: Abnormal  ? Collection Time: 01/28/22 11:38 PM  ?Result Value Ref Range  ? WBC 8.8 4.0 - 10.5 K/uL  ? RBC 4.36 3.87 - 5.11 MIL/uL  ? Hemoglobin 11.8 (L) 12.0 - 15.0 g/dL  ? HCT 34.7 (L) 36.0 - 46.0 %  ? MCV 79.6 (L) 80.0 - 100.0 fL  ? MCH 27.1 26.0 - 34.0 pg  ? MCHC 34.0 30.0 - 36.0 g/dL  ? RDW 14.0 11.5 - 15.5 %  ? Platelets 271  150 - 400 K/uL  ? nRBC 0.0 0.0 - 0.2 %  ?Wet prep, genital     Status: None  ? Collection Time: 01/29/22 12:15 AM  ?Result Value Ref Range  ? Yeast Wet Prep HPF POC NONE SEEN NONE SEEN  ? Trich, Wet Prep NONE SEEN NONE SEEN  ? Clue Cells Wet Prep HPF POC NONE SEEN NONE SEEN  ? WBC, Wet Prep HPF POC <10 <10  ? Sperm NONE SEEN   ?Urinalysis, Routine w reflex microscopic     Status: Abnormal  ? Collection Time: 01/29/22 12:26 AM  ?Result Value Ref Range  ? Color, Urine YELLOW YELLOW  ? APPearance CLEAR CLEAR  ? Specific Gravity, Urine 1.028 1.005 - 1.030  ? pH 5.0 5.0 - 8.0  ? Glucose, UA NEGATIVE NEGATIVE mg/dL  ? Hgb urine dipstick MODERATE (A) NEGATIVE  ? Bilirubin Urine NEGATIVE NEGATIVE  ? Ketones, ur 5 (A) NEGATIVE mg/dL  ? Protein, ur NEGATIVE NEGATIVE mg/dL  ? Nitrite NEGATIVE NEGATIVE  ? Leukocytes,Ua NEGATIVE NEGATIVE  ? RBC / HPF 11-20 0 - 5 RBC/hpf  ? WBC, UA 0-5 0 - 5 WBC/hpf  ? Bacteria, UA NONE SEEN NONE SEEN  ? Squamous Epithelial / LPF 0-5 0 - 5  ? Mucus PRESENT   ? Ca Oxalate Crys, UA PRESENT   ?  ?US OB Comp Less 14 Wks ? ?Result Date: 01/29/2022 ?CLINICAL DATA:  Pregnancy and lower abdominal pain. LMP: 11/04/2021 corresponding to an estimated gestational age of [redacted] weeks, 1 day. EXAM: OBSTETRIC <14 WK ULTRASOUND TECHNIQUE: Transabdominal ultrasound was performed for evaluation of the gestation as well as the maternal uterus and adnexal regions. COMPARISON:  None. FINDINGS: Intrauterine gestational sac: Single intrauterine gestational sac Yolk sac:  Not seen Embryo:  Present Cardiac Activity: Detected Heart Rate: 169 bpm CRL:   45 mm   11 w 1 d                  [place] EDC: 08/19/2022 Subchorionic hemorrhage:  None visualized. Maternal uterus/adnexae: The maternal ovaries are unremarkable. No significant free fluid in the pelvis. IMPRESSION: Single live intrauterine pregnancy with an estimated gestational age of [redacted] weeks, 1 day. Electronically Signed   By:  Anner Crete M.D.   On: 01/29/2022 00:38    ? ?MDM ?UA ?Wet prep  ?US OB Comp Less 14 weeks ?Offered pain medication in MAU- patient states she will take tylenol at home. ? ?Assessment and Plan  ? ?1. Normal intrauterine pregnancy on prenatal ultrasound in second trimester   ?2. [redacted] weeks gestation of pregnancy   ? ?-Discharge home in stable condition ?-Vaginal bleeding precautions discussed ?-Patient advised to follow-up with OB as scheduled for prenatal care ?-Patient may return to MAU as needed or if her condition were to change or worsen ? ? ?Wende Mott CNM ?01/29/2022, 12:50 AM  ?

## 2022-01-29 NOTE — Discharge Instructions (Signed)

## 2022-02-01 ENCOUNTER — Telehealth: Payer: Self-pay | Admitting: *Deleted

## 2022-02-01 ENCOUNTER — Other Ambulatory Visit: Payer: Self-pay

## 2022-02-01 DIAGNOSIS — Z8632 Personal history of gestational diabetes: Secondary | ICD-10-CM

## 2022-02-01 DIAGNOSIS — O0990 Supervision of high risk pregnancy, unspecified, unspecified trimester: Secondary | ICD-10-CM

## 2022-02-01 NOTE — Telephone Encounter (Addendum)
-----   Message from Levie Heritage, DO sent at 01/29/2022 12:56 PM EDT ----- ?HgA1c 5.8 to prediabetic level. Needs to have early 2hr GTT. If negative, will need to repeat at 26-28 weeks. Please let pt know and schedule. ? ?4/3  1600 Called pt w.interpreter [employer]. She was informed of abnormal glucose test and need for 2hr GTT. Pt stated that she has already been scheduled on 4/11 @ 0820. She had no questions.  ?

## 2022-02-09 ENCOUNTER — Other Ambulatory Visit: Payer: No Typology Code available for payment source

## 2022-02-09 DIAGNOSIS — Z8632 Personal history of gestational diabetes: Secondary | ICD-10-CM

## 2022-02-09 DIAGNOSIS — O0990 Supervision of high risk pregnancy, unspecified, unspecified trimester: Secondary | ICD-10-CM

## 2022-02-10 LAB — GLUCOSE TOLERANCE, 2 HOURS W/ 1HR
Glucose, 1 hour: 159 mg/dL (ref 70–179)
Glucose, 2 hour: 141 mg/dL (ref 70–152)
Glucose, Fasting: 90 mg/dL (ref 70–91)

## 2022-02-24 ENCOUNTER — Ambulatory Visit (INDEPENDENT_AMBULATORY_CARE_PROVIDER_SITE_OTHER): Payer: Self-pay | Admitting: Obstetrics and Gynecology

## 2022-02-24 VITALS — BP 141/73 | HR 83 | Wt 179.2 lb

## 2022-02-24 DIAGNOSIS — O09529 Supervision of elderly multigravida, unspecified trimester: Secondary | ICD-10-CM

## 2022-02-24 DIAGNOSIS — O10919 Unspecified pre-existing hypertension complicating pregnancy, unspecified trimester: Secondary | ICD-10-CM

## 2022-02-24 DIAGNOSIS — Z3A15 15 weeks gestation of pregnancy: Secondary | ICD-10-CM

## 2022-02-24 DIAGNOSIS — O0990 Supervision of high risk pregnancy, unspecified, unspecified trimester: Secondary | ICD-10-CM

## 2022-02-24 DIAGNOSIS — Z789 Other specified health status: Secondary | ICD-10-CM

## 2022-02-24 DIAGNOSIS — Z8632 Personal history of gestational diabetes: Secondary | ICD-10-CM

## 2022-02-24 LAB — POCT URINALYSIS DIP (DEVICE)
Bilirubin Urine: NEGATIVE
Glucose, UA: NEGATIVE mg/dL
Ketones, ur: NEGATIVE mg/dL
Leukocytes,Ua: NEGATIVE
Nitrite: NEGATIVE
Protein, ur: NEGATIVE mg/dL
Specific Gravity, Urine: >=1.03 (ref 1.005–1.030)
Urobilinogen, UA: 0.2 mg/dL (ref 0.0–1.0)
pH: 5.5 (ref 5.0–8.0)

## 2022-02-24 NOTE — Progress Notes (Signed)
? ?  PRENATAL VISIT NOTE ? ?Subjective:  ?Kathleen Tucker is a 46 y.o. G7P5015 at [redacted]w[redacted]d being seen today for ongoing prenatal care.  She is currently monitored for the following issues for this high-risk pregnancy and has Language barrier; ASCUS with positive high risk HPV cervical; Supervision of high risk pregnancy, antepartum; AMA (advanced maternal age) multigravida 35+, unspecified trimester; Chronic hypertension affecting pregnancy; and History of gestational diabetes mellitus (GDM) on their problem list. ? ?Patient doing well with no acute concerns today. She reports no complaints.  Contractions: Not present. Vag. Bleeding: None.  Movement: Absent. Denies leaking of fluid.  ? ?The following portions of the patient's history were reviewed and updated as appropriate: allergies, current medications, past family history, past medical history, past social history, past surgical history and problem list. Problem list updated. ? ?Objective:  ? ?Vitals:  ? 02/24/22 0840  ?BP: (!) 141/73  ?Pulse: 83  ?Weight: 81.3 kg  ? ? ?Fetal Status: Fetal Heart Rate (bpm): 154   Movement: Absent    ? ?General:  Alert, oriented and cooperative. Patient is in no acute distress.  ?Skin: Skin is warm and dry. No rash noted.   ?Cardiovascular: Normal heart rate noted  ?Respiratory: Normal respiratory effort, no problems with respiration noted  ?Abdomen: Soft, gravid, appropriate for gestational age.  Pain/Pressure: Present     ?Pelvic: Cervical exam deferred        ?Extremities: Normal range of motion.  Edema: None  ?Mental Status:  Normal mood and affect. Normal behavior. Normal judgment and thought content.  ? ?Assessment and Plan:  ?Pregnancy: [id number] at [redacted]w[redacted]d ? ?1. Chronic hypertension affecting pregnancy ?Pt did not take her labetalol today, pt advised it is important to take her meds daily ? ?2. Supervision of high risk pregnancy, antepartum ?Continue routine care, ?Will schedule anatomy scan through [facility] ? ?- [facility] AMB  BABYSCRIPTS SCHEDULE OPTIMIZATION ?- AFP, Serum, Open Spina Bifida ? ?3. [redacted] weeks gestation of pregnancy ? ? ?4. Language barrier ?Live interpreter present ? ?5. History of gestational diabetes mellitus (GDM) ?Pt passed  her early 2 hour GTT ? ?6. AMA (advanced maternal age) multigravida 42+, unspecified trimester ? ? ?Preterm labor symptoms and general obstetric precautions including but not limited to vaginal bleeding, contractions, leaking of fluid and fetal movement were reviewed in detail with the patient. ? ?Please refer to After Visit Summary for other counseling recommendations.  ? ?Return in about 4 weeks (around 03/24/2022) for in person, [facility]. ? ? ?Lynnda Shields, MD ?Faculty Attending ?Center for [place] ?  ?

## 2022-02-25 ENCOUNTER — Telehealth: Payer: Self-pay

## 2022-02-25 NOTE — Telephone Encounter (Addendum)
-----   Message from Shelly Coss, RN sent at 02/24/2022  9:26 AM EDT ----- ?Regarding: [facility] ultrasound ?Needs detail anatomy scan at [facility] through [facility] & form completed. May calendar not open yet ? ? ?[place] scheduled for 03/22/22 at 11 AM. Called pt with interpreter Eda with appt info. Order faxed.  ?

## 2022-02-26 LAB — AFP, SERUM, OPEN SPINA BIFIDA
AFP MoM: 0.51
AFP Value: 15.7 ng/mL
Gest. Age on Collection Date: 16 weeks
Maternal Age At EDD: 43.8 yr
OSBR Risk 1 IN: 10000
Test Results:: NEGATIVE
Weight: 179 [lb_av]

## 2022-03-25 ENCOUNTER — Ambulatory Visit (INDEPENDENT_AMBULATORY_CARE_PROVIDER_SITE_OTHER): Payer: Self-pay | Admitting: Obstetrics and Gynecology

## 2022-03-25 VITALS — BP 127/72 | HR 79 | Wt 181.4 lb

## 2022-03-25 DIAGNOSIS — R87610 Atypical squamous cells of undetermined significance on cytologic smear of cervix (ASC-US): Secondary | ICD-10-CM

## 2022-03-25 DIAGNOSIS — R87810 Cervical high risk human papillomavirus (HPV) DNA test positive: Secondary | ICD-10-CM

## 2022-03-25 DIAGNOSIS — Z8632 Personal history of gestational diabetes: Secondary | ICD-10-CM

## 2022-03-25 DIAGNOSIS — Z3A20 20 weeks gestation of pregnancy: Secondary | ICD-10-CM

## 2022-03-25 DIAGNOSIS — Z758 Other problems related to medical facilities and other health care: Secondary | ICD-10-CM

## 2022-03-25 DIAGNOSIS — O0990 Supervision of high risk pregnancy, unspecified, unspecified trimester: Secondary | ICD-10-CM

## 2022-03-25 DIAGNOSIS — O10919 Unspecified pre-existing hypertension complicating pregnancy, unspecified trimester: Secondary | ICD-10-CM

## 2022-03-25 DIAGNOSIS — O09529 Supervision of elderly multigravida, unspecified trimester: Secondary | ICD-10-CM

## 2022-03-25 DIAGNOSIS — Z789 Other specified health status: Secondary | ICD-10-CM

## 2022-03-25 NOTE — Progress Notes (Signed)
   PRENATAL VISIT NOTE  Subjective:  Kathleen Tucker is a 46 y.o. G7P5015 at [redacted]w[redacted]d being seen today for ongoing prenatal care.  She is currently monitored for the following issues for this high-risk pregnancy and has Language barrier; ASCUS with positive high risk HPV cervical; Supervision of high risk pregnancy, antepartum; AMA (advanced maternal age) multigravida 35+, unspecified trimester; Chronic hypertension affecting pregnancy; and History of gestational diabetes mellitus (GDM) on their problem list.  Patient doing well with no acute concerns today. She reports occasional contractions. Only had 3 Contractions: Irritability. Vag. Bleeding: None.  Movement: Absent. Denies leaking of fluid.   The following portions of the patient's history were reviewed and updated as appropriate: allergies, current medications, past family history, past medical history, past social history, past surgical history and problem list. Problem list updated.  Objective:   Vitals:   03/25/22 1120  BP: 127/72  Pulse: 79  Weight: 181 lb 6.4 oz (82.3 kg)    Fetal Status: Fetal Heart Rate (bpm): 149 Fundal Height: 20 cm Movement: Absent     General:  Alert, oriented and cooperative. Patient is in no acute distress.  Skin: Skin is warm and dry. No rash noted.   Cardiovascular: Normal heart rate noted  Respiratory: Normal respiratory effort, no problems with respiration noted  Abdomen: Soft, gravid, appropriate for gestational age.  Pain/Pressure: Present     Pelvic: Cervical exam deferred        Extremities: Normal range of motion.  Edema: None  Mental Status:  Normal mood and affect. Normal behavior. Normal judgment and thought content.   Assessment and Plan:  Pregnancy: [id number] at [redacted]w[redacted]d  1. [redacted] weeks gestation of pregnancy   2. Chronic hypertension affecting pregnancy Pt continues with labetalol and baby ASA, good BP control noted  3. Language barrier Live interpreter present  4. Supervision of  high risk pregnancy, antepartum Continue routine prenatal care Follow up on anatomy scan from [facility]  5. AMA (advanced maternal age) multigravida 68+, unspecified trimester Monitor per protocol  6. History of gestational diabetes mellitus (GDM) Early 2 hour GTT wNL, recheck at 26-28 weeks  7. ASCUS with positive high risk HPV cervical Repap after delivery  Preterm labor symptoms and general obstetric precautions including but not limited to vaginal bleeding, contractions, leaking of fluid and fetal movement were reviewed in detail with the patient.  Please refer to After Visit Summary for other counseling recommendations.   Return in about 4 weeks (around 04/22/2022) for [facility], in person.   Lynnda Shields, MD Faculty Attending Center for [facility]

## 2022-04-23 ENCOUNTER — Other Ambulatory Visit: Payer: Self-pay

## 2022-04-23 ENCOUNTER — Ambulatory Visit (INDEPENDENT_AMBULATORY_CARE_PROVIDER_SITE_OTHER): Payer: Self-pay | Admitting: Family Medicine

## 2022-04-23 ENCOUNTER — Encounter: Payer: Self-pay | Admitting: Family Medicine

## 2022-04-23 VITALS — BP 110/73 | HR 83 | Wt 184.0 lb

## 2022-04-23 DIAGNOSIS — O10919 Unspecified pre-existing hypertension complicating pregnancy, unspecified trimester: Secondary | ICD-10-CM

## 2022-04-23 DIAGNOSIS — O4400 Placenta previa specified as without hemorrhage, unspecified trimester: Secondary | ICD-10-CM | POA: Insufficient documentation

## 2022-04-23 DIAGNOSIS — Z8632 Personal history of gestational diabetes: Secondary | ICD-10-CM

## 2022-04-23 DIAGNOSIS — O4402 Placenta previa specified as without hemorrhage, second trimester: Secondary | ICD-10-CM

## 2022-04-23 DIAGNOSIS — O0990 Supervision of high risk pregnancy, unspecified, unspecified trimester: Secondary | ICD-10-CM

## 2022-04-23 DIAGNOSIS — O09529 Supervision of elderly multigravida, unspecified trimester: Secondary | ICD-10-CM

## 2022-04-23 DIAGNOSIS — Z789 Other specified health status: Secondary | ICD-10-CM

## 2022-04-23 DIAGNOSIS — R87810 Cervical high risk human papillomavirus (HPV) DNA test positive: Secondary | ICD-10-CM

## 2022-04-23 DIAGNOSIS — Z3A23 23 weeks gestation of pregnancy: Secondary | ICD-10-CM

## 2022-04-23 DIAGNOSIS — R87610 Atypical squamous cells of undetermined significance on cytologic smear of cervix (ASC-US): Secondary | ICD-10-CM

## 2022-04-23 NOTE — Progress Notes (Signed)
   Subjective:  Kathleen Tucker is a 46 y.o. G7P5015 at [redacted]w[redacted]d being seen today for ongoing prenatal care.  She is currently monitored for the following issues for this high-risk pregnancy and has Language barrier; ASCUS with positive high risk HPV cervical; Supervision of high risk pregnancy, antepartum; AMA (advanced maternal age) multigravida 35+, unspecified trimester; Chronic hypertension affecting pregnancy; and History of gestational diabetes mellitus (GDM) on their problem list.  Patient reports no complaints.  Contractions: Irritability. Vag. Bleeding: None.  Movement: Present. Denies leaking of fluid.   The following portions of the patient's history were reviewed and updated as appropriate: allergies, current medications, past family history, past medical history, past social history, past surgical history and problem list. Problem list updated.  Objective:   Vitals:   04/23/22 1139  BP: 110/73  Pulse: 83  Weight: 184 lb (83.5 kg)    Fetal Status: Fetal Heart Rate (bpm): 150   Movement: Present     General:  Alert, oriented and cooperative. Patient is in no acute distress.  Skin: Skin is warm and dry. No rash noted.   Cardiovascular: Normal heart rate noted  Respiratory: Normal respiratory effort, no problems with respiration noted  Abdomen: Soft, gravid, appropriate for gestational age. Pain/Pressure: Present     Pelvic: Vag. Bleeding: None     Cervical exam deferred        Extremities: Normal range of motion.     Mental Status: Normal mood and affect. Normal behavior. Normal judgment and thought content.   Urinalysis:      Assessment and Plan:  Pregnancy: [id number] at [redacted]w[redacted]d  1. Supervision of high risk pregnancy, antepartum BP and FHR normal On review of chart, EDD updated to 08/19/2022 based on 11 wk [place] >7d difference with LMP  2. Chronic hypertension affecting pregnancy On Labetalol 200 BID and ASA Normotensive Will need antenatal testing at 32 weeks  3.  Language barrier Spanish  4. History of gestational diabetes mellitus (GDM) Borderline new ob A1c, normal 2hr GTT  5. ASCUS with positive high risk HPV cervical Needs pp pap with [facility]CP  6. AMA (advanced maternal age) multigravida 35+, unspecified trimester Antenatal testing per guidelines On ASA  7. Placenta previa Noted on anatomy scan from May, reportedly posterior Patient unaware of diagnosis and it had not yet been added to the problem list Discussed this diagnosis, need for ongoing monitoring, possible cesarean if it does not migrate off Also discussed pelvic rest Will reschedule follow up US with MFM, if resolved can return to [place] with [facility]  Preterm labor symptoms and general obstetric precautions including but not limited to vaginal bleeding, contractions, leaking of fluid and fetal movement were reviewed in detail with the patient. Please refer to After Visit Summary for other counseling recommendations.  Return for [facility], ob visit, needs MD.   Venora Maples, MD

## 2022-04-28 ENCOUNTER — Telehealth: Payer: Self-pay | Admitting: *Deleted

## 2022-04-28 DIAGNOSIS — O0990 Supervision of high risk pregnancy, unspecified, unspecified trimester: Secondary | ICD-10-CM

## 2022-04-28 DIAGNOSIS — O4400 Placenta previa specified as without hemorrhage, unspecified trimester: Secondary | ICD-10-CM

## 2022-04-28 DIAGNOSIS — O10919 Unspecified pre-existing hypertension complicating pregnancy, unspecified trimester: Secondary | ICD-10-CM

## 2022-04-28 DIAGNOSIS — O09529 Supervision of elderly multigravida, unspecified trimester: Secondary | ICD-10-CM

## 2022-04-28 NOTE — Telephone Encounter (Signed)
-----   Message from Venora Maples, MD sent at 04/26/2022  8:43 AM EDT ----- Regarding: Needs MFM [place] Patient has a placenta previa from her last [facility] [place].  Needs follow up US with MFM as long as she is agreeable to it. Please call patient and ask her if this is OK cost wise and if so proceed with scheduling a scan with MFM.

## 2022-04-28 NOTE — Telephone Encounter (Signed)
I called [name] with Interpreter Eda Royal and informed her results and recommendation per Dr. Crissie Reese. She agrees [place] can be done with MFM and she states she has applied for financial assistance already. [place] scheduled for first available appointment 05/03/22 at 0815and patient notified. She agrees to appointment.  Kathleen Tucker

## 2022-05-03 ENCOUNTER — Ambulatory Visit: Payer: No Typology Code available for payment source | Admitting: *Deleted

## 2022-05-03 ENCOUNTER — Encounter: Payer: Self-pay | Admitting: *Deleted

## 2022-05-03 ENCOUNTER — Other Ambulatory Visit: Payer: Self-pay | Admitting: *Deleted

## 2022-05-03 ENCOUNTER — Ambulatory Visit: Payer: No Typology Code available for payment source | Attending: Family Medicine

## 2022-05-03 VITALS — BP 125/60 | HR 84

## 2022-05-03 DIAGNOSIS — O10919 Unspecified pre-existing hypertension complicating pregnancy, unspecified trimester: Secondary | ICD-10-CM

## 2022-05-03 DIAGNOSIS — O09529 Supervision of elderly multigravida, unspecified trimester: Secondary | ICD-10-CM

## 2022-05-03 DIAGNOSIS — E669 Obesity, unspecified: Secondary | ICD-10-CM

## 2022-05-03 DIAGNOSIS — Z3A24 24 weeks gestation of pregnancy: Secondary | ICD-10-CM

## 2022-05-03 DIAGNOSIS — O10012 Pre-existing essential hypertension complicating pregnancy, second trimester: Secondary | ICD-10-CM

## 2022-05-03 DIAGNOSIS — O10912 Unspecified pre-existing hypertension complicating pregnancy, second trimester: Secondary | ICD-10-CM

## 2022-05-03 DIAGNOSIS — O4400 Placenta previa specified as without hemorrhage, unspecified trimester: Secondary | ICD-10-CM

## 2022-05-03 DIAGNOSIS — O99212 Obesity complicating pregnancy, second trimester: Secondary | ICD-10-CM

## 2022-05-03 DIAGNOSIS — O09522 Supervision of elderly multigravida, second trimester: Secondary | ICD-10-CM

## 2022-05-03 DIAGNOSIS — O0990 Supervision of high risk pregnancy, unspecified, unspecified trimester: Secondary | ICD-10-CM

## 2022-05-03 DIAGNOSIS — O0940 Supervision of pregnancy with grand multiparity, unspecified trimester: Secondary | ICD-10-CM

## 2022-05-07 ENCOUNTER — Encounter: Payer: Self-pay | Admitting: *Deleted

## 2022-05-11 ENCOUNTER — Other Ambulatory Visit: Payer: Self-pay

## 2022-05-11 DIAGNOSIS — O0990 Supervision of high risk pregnancy, unspecified, unspecified trimester: Secondary | ICD-10-CM

## 2022-05-13 ENCOUNTER — Other Ambulatory Visit: Payer: No Typology Code available for payment source

## 2022-05-13 ENCOUNTER — Encounter: Payer: No Typology Code available for payment source | Admitting: Obstetrics & Gynecology

## 2022-05-31 ENCOUNTER — Ambulatory Visit: Payer: No Typology Code available for payment source | Attending: Obstetrics

## 2022-05-31 ENCOUNTER — Ambulatory Visit: Payer: No Typology Code available for payment source | Admitting: *Deleted

## 2022-05-31 ENCOUNTER — Encounter: Payer: Self-pay | Admitting: *Deleted

## 2022-05-31 ENCOUNTER — Other Ambulatory Visit: Payer: Self-pay | Admitting: *Deleted

## 2022-05-31 VITALS — BP 123/63 | HR 82

## 2022-05-31 DIAGNOSIS — O0990 Supervision of high risk pregnancy, unspecified, unspecified trimester: Secondary | ICD-10-CM | POA: Insufficient documentation

## 2022-05-31 DIAGNOSIS — O10013 Pre-existing essential hypertension complicating pregnancy, third trimester: Secondary | ICD-10-CM

## 2022-05-31 DIAGNOSIS — O09523 Supervision of elderly multigravida, third trimester: Secondary | ICD-10-CM

## 2022-05-31 DIAGNOSIS — Z6830 Body mass index (BMI) 30.0-30.9, adult: Secondary | ICD-10-CM

## 2022-05-31 DIAGNOSIS — O10912 Unspecified pre-existing hypertension complicating pregnancy, second trimester: Secondary | ICD-10-CM | POA: Insufficient documentation

## 2022-05-31 DIAGNOSIS — O99213 Obesity complicating pregnancy, third trimester: Secondary | ICD-10-CM

## 2022-05-31 DIAGNOSIS — O0943 Supervision of pregnancy with grand multiparity, third trimester: Secondary | ICD-10-CM

## 2022-05-31 DIAGNOSIS — E669 Obesity, unspecified: Secondary | ICD-10-CM

## 2022-05-31 DIAGNOSIS — Z3A28 28 weeks gestation of pregnancy: Secondary | ICD-10-CM

## 2022-05-31 DIAGNOSIS — O10919 Unspecified pre-existing hypertension complicating pregnancy, unspecified trimester: Secondary | ICD-10-CM

## 2022-05-31 DIAGNOSIS — O09522 Supervision of elderly multigravida, second trimester: Secondary | ICD-10-CM | POA: Insufficient documentation

## 2022-06-11 ENCOUNTER — Other Ambulatory Visit: Payer: No Typology Code available for payment source

## 2022-06-15 ENCOUNTER — Other Ambulatory Visit: Payer: No Typology Code available for payment source

## 2022-06-15 ENCOUNTER — Encounter: Payer: Self-pay | Admitting: Medical

## 2022-06-15 ENCOUNTER — Other Ambulatory Visit: Payer: Self-pay

## 2022-06-15 ENCOUNTER — Ambulatory Visit (INDEPENDENT_AMBULATORY_CARE_PROVIDER_SITE_OTHER): Payer: Self-pay | Admitting: Medical

## 2022-06-15 VITALS — BP 132/75 | HR 79 | Wt 188.3 lb

## 2022-06-15 DIAGNOSIS — O0990 Supervision of high risk pregnancy, unspecified, unspecified trimester: Secondary | ICD-10-CM

## 2022-06-15 DIAGNOSIS — O10913 Unspecified pre-existing hypertension complicating pregnancy, third trimester: Secondary | ICD-10-CM

## 2022-06-15 DIAGNOSIS — O10919 Unspecified pre-existing hypertension complicating pregnancy, unspecified trimester: Secondary | ICD-10-CM

## 2022-06-15 DIAGNOSIS — Z8632 Personal history of gestational diabetes: Secondary | ICD-10-CM

## 2022-06-15 DIAGNOSIS — O0993 Supervision of high risk pregnancy, unspecified, third trimester: Secondary | ICD-10-CM

## 2022-06-15 DIAGNOSIS — O09523 Supervision of elderly multigravida, third trimester: Secondary | ICD-10-CM

## 2022-06-15 DIAGNOSIS — O09529 Supervision of elderly multigravida, unspecified trimester: Secondary | ICD-10-CM

## 2022-06-15 DIAGNOSIS — Z3A30 30 weeks gestation of pregnancy: Secondary | ICD-10-CM

## 2022-06-15 LAB — POCT URINALYSIS DIP (DEVICE)
Bilirubin Urine: NEGATIVE
Glucose, UA: 500 mg/dL — AB
Ketones, ur: NEGATIVE mg/dL
Leukocytes,Ua: NEGATIVE
Nitrite: NEGATIVE
Protein, ur: NEGATIVE mg/dL
Specific Gravity, Urine: >=1.03 (ref 1.005–1.030)
Urobilinogen, UA: 0.2 mg/dL (ref 0.0–1.0)
pH: 5.5 (ref 5.0–8.0)

## 2022-06-15 NOTE — Progress Notes (Unsigned)
Letter given to Pt to take to [facility]. Co. HD to get Tdap.

## 2022-06-16 LAB — GLUCOSE TOLERANCE, 2 HOURS W/ 1HR
Glucose, 1 hour: 208 mg/dL — ABNORMAL HIGH (ref 70–179)
Glucose, 2 hour: 222 mg/dL — ABNORMAL HIGH (ref 70–152)
Glucose, Fasting: 86 mg/dL (ref 70–91)

## 2022-06-16 LAB — RPR: RPR Ser Ql: NONREACTIVE

## 2022-06-16 LAB — CBC
Hematocrit: 35.3 % (ref 34.0–46.6)
Hemoglobin: 11.1 g/dL (ref 11.1–15.9)
MCH: 25.5 pg — ABNORMAL LOW (ref 26.6–33.0)
MCHC: 31.4 g/dL — ABNORMAL LOW (ref 31.5–35.7)
MCV: 81 fL (ref 79–97)
Platelets: 243 10*3/uL (ref 150–450)
RBC: 4.35 x10E6/uL (ref 3.77–5.28)
RDW: 13.1 % (ref 11.7–15.4)
WBC: 7.6 10*3/uL (ref 3.4–10.8)

## 2022-06-16 LAB — HIV ANTIBODY (ROUTINE TESTING W REFLEX): HIV Screen 4th Generation wRfx: NONREACTIVE

## 2022-06-16 NOTE — Progress Notes (Signed)
   PRENATAL VISIT NOTE  Subjective:  Kathleen Tucker is a 46 y.o. G7P5015 at [redacted]w[redacted]d being seen today for ongoing prenatal care.  She is currently monitored for the following issues for this high-risk pregnancy and has Language barrier; ASCUS with positive high risk HPV cervical; Supervision of high risk pregnancy, antepartum; AMA (advanced maternal age) multigravida 35+, unspecified trimester; Chronic hypertension affecting pregnancy; and History of gestational diabetes mellitus (GDM) on their problem list.  Patient reports occasional contractions.  Contractions: Irritability. Vag. Bleeding: None.  Movement: Present. Denies leaking of fluid.   The following portions of the patient's history were reviewed and updated as appropriate: allergies, current medications, past family history, past medical history, past social history, past surgical history and problem list.   Objective:   Vitals:   06/15/22 1136  BP: 132/75  Pulse: 79  Weight: 188 lb 4.8 oz (85.4 kg)    Fetal Status: Fetal Heart Rate (bpm): 140 Fundal Height: 31 cm Movement: Present     General:  Alert, oriented and cooperative. Patient is in no acute distress.  Skin: Skin is warm and dry. No rash noted.   Cardiovascular: Normal heart rate noted  Respiratory: Normal respiratory effort, no problems with respiration noted  Abdomen: Soft, gravid, appropriate for gestational age.  Pain/Pressure: Present     Pelvic: Cervical exam deferred        Extremities: Normal range of motion.  Edema: Trace  Mental Status: Normal mood and affect. Normal behavior. Normal judgment and thought content.   Assessment and Plan:  Pregnancy: [id number] at [redacted]w[redacted]d 1. Supervision of high risk pregnancy in third trimester - GTT, CBC, HIV, RPR today  - TDAP letter given for [facility]  2. AMA (advanced maternal age) multigravida 35+, unspecified trimester - Antenatal testing scheduled with MFM   4. Chronic hypertension affecting pregnancy - Antenatal  testing scheduled with MFM starting 8/28 - Well-controlled on Labetalol 200 mg BID   5. History of gestational diabetes mellitus (GDM) - GTT today   6. [redacted] weeks gestation of pregnancy  Preterm labor symptoms and general obstetric precautions including but not limited to vaginal bleeding, contractions, leaking of fluid and fetal movement were reviewed in detail with the patient. Please refer to After Visit Summary for other counseling recommendations.   Return in about 2 weeks (around 06/29/2022) for [facility] APP, In-Person.  Future Appointments  Date Time Provider Department Center  06/28/2022  3:30 PM [facility] NURSE [facility]  06/28/2022  3:45 PM [facility] US1 [facility]  07/06/2022  2:15 PM [facility] NURSE [facility]  07/06/2022  2:30 PM [facility] US3 [facility]  07/09/2022  9:35 AM [facility]  07/12/2022  2:30 PM [facility] NURSE [facility]  07/12/2022  2:45 PM [facility] US5 [facility]    Vonzella Nipple, PA-C

## 2022-06-22 ENCOUNTER — Telehealth: Payer: Self-pay

## 2022-06-22 DIAGNOSIS — O24410 Gestational diabetes mellitus in pregnancy, diet controlled: Secondary | ICD-10-CM

## 2022-06-22 NOTE — Telephone Encounter (Signed)
-----   Message from Adam Phenix, MD sent at 06/22/2022  2:57 PM EDT ----- Needs diabetes education for failed GTT   Called pt with interpreter Eda; diabetes education appt scheduled for 07/01/22.

## 2022-06-28 ENCOUNTER — Ambulatory Visit: Payer: No Typology Code available for payment source | Attending: Obstetrics and Gynecology

## 2022-06-28 ENCOUNTER — Encounter: Payer: Self-pay | Admitting: *Deleted

## 2022-06-28 ENCOUNTER — Ambulatory Visit: Payer: No Typology Code available for payment source | Admitting: *Deleted

## 2022-06-28 VITALS — BP 135/71 | HR 71

## 2022-06-28 DIAGNOSIS — O24419 Gestational diabetes mellitus in pregnancy, unspecified control: Secondary | ICD-10-CM

## 2022-06-28 DIAGNOSIS — O0943 Supervision of pregnancy with grand multiparity, third trimester: Secondary | ICD-10-CM

## 2022-06-28 DIAGNOSIS — O0990 Supervision of high risk pregnancy, unspecified, unspecified trimester: Secondary | ICD-10-CM

## 2022-06-28 DIAGNOSIS — O99213 Obesity complicating pregnancy, third trimester: Secondary | ICD-10-CM

## 2022-06-28 DIAGNOSIS — Z3A32 32 weeks gestation of pregnancy: Secondary | ICD-10-CM

## 2022-06-28 DIAGNOSIS — Z6830 Body mass index (BMI) 30.0-30.9, adult: Secondary | ICD-10-CM | POA: Insufficient documentation

## 2022-06-28 DIAGNOSIS — O10919 Unspecified pre-existing hypertension complicating pregnancy, unspecified trimester: Secondary | ICD-10-CM | POA: Insufficient documentation

## 2022-06-28 DIAGNOSIS — E669 Obesity, unspecified: Secondary | ICD-10-CM

## 2022-06-28 DIAGNOSIS — O09523 Supervision of elderly multigravida, third trimester: Secondary | ICD-10-CM | POA: Insufficient documentation

## 2022-06-28 DIAGNOSIS — O10013 Pre-existing essential hypertension complicating pregnancy, third trimester: Secondary | ICD-10-CM

## 2022-06-30 ENCOUNTER — Encounter: Payer: Self-pay | Attending: Pediatric Endocrinology | Admitting: Nutrition

## 2022-06-30 DIAGNOSIS — O24410 Gestational diabetes mellitus in pregnancy, diet controlled: Secondary | ICD-10-CM | POA: Insufficient documentation

## 2022-06-30 DIAGNOSIS — Z713 Dietary counseling and surveillance: Secondary | ICD-10-CM | POA: Insufficient documentation

## 2022-07-01 ENCOUNTER — Other Ambulatory Visit: Payer: No Typology Code available for payment source

## 2022-07-03 NOTE — Progress Notes (Signed)
Patient is here with the interpreter.  She speaks very limited english.  We discussed what gestational diabetes is, and the need to monitor diet and blood sugars to prevent blood sugars from going high after eating.  Also discussed need to keep active and get weight off after the pregnancy to prevent diabetes later in life.  She reported good understanding of this.  Diet :  diet review shows carbs are 75 -90 per meal,Discussed need to limit carbs at breakfast to only 45 by limiting the amounts of tortillas - 4-6/ meal. She was given a Prodogy meter, and shown how to test her blood sugar.  She re demonstrated the procedure correctly.  Her blood sugar was 152 2hr. pcB.  She reported having fried tortillas this morning-something she rarely does.  Encouraged her to stop this until after the pregnancy is over, and discussed the need to stop all fried foods at this time.  She agreed to do this.  She was told to go by [facility] get test strips for free Pt. Is also eating fruit between meals.  Discussed the need to eat fruit with a meal, and not between, and to have this only at lunch and supper, and also at HS.  She also agreed to do this.

## 2022-07-06 ENCOUNTER — Encounter: Payer: Self-pay | Admitting: *Deleted

## 2022-07-06 ENCOUNTER — Other Ambulatory Visit: Payer: Self-pay | Admitting: *Deleted

## 2022-07-06 ENCOUNTER — Ambulatory Visit: Payer: No Typology Code available for payment source | Admitting: *Deleted

## 2022-07-06 ENCOUNTER — Ambulatory Visit: Payer: No Typology Code available for payment source | Attending: Obstetrics and Gynecology

## 2022-07-06 VITALS — BP 144/87 | HR 79

## 2022-07-06 DIAGNOSIS — E669 Obesity, unspecified: Secondary | ICD-10-CM

## 2022-07-06 DIAGNOSIS — O0990 Supervision of high risk pregnancy, unspecified, unspecified trimester: Secondary | ICD-10-CM | POA: Insufficient documentation

## 2022-07-06 DIAGNOSIS — Z6830 Body mass index (BMI) 30.0-30.9, adult: Secondary | ICD-10-CM | POA: Insufficient documentation

## 2022-07-06 DIAGNOSIS — O09523 Supervision of elderly multigravida, third trimester: Secondary | ICD-10-CM | POA: Insufficient documentation

## 2022-07-06 DIAGNOSIS — O0943 Supervision of pregnancy with grand multiparity, third trimester: Secondary | ICD-10-CM

## 2022-07-06 DIAGNOSIS — O99213 Obesity complicating pregnancy, third trimester: Secondary | ICD-10-CM

## 2022-07-06 DIAGNOSIS — O24419 Gestational diabetes mellitus in pregnancy, unspecified control: Secondary | ICD-10-CM

## 2022-07-06 DIAGNOSIS — Z3A33 33 weeks gestation of pregnancy: Secondary | ICD-10-CM

## 2022-07-06 DIAGNOSIS — O24415 Gestational diabetes mellitus in pregnancy, controlled by oral hypoglycemic drugs: Secondary | ICD-10-CM

## 2022-07-06 DIAGNOSIS — O10919 Unspecified pre-existing hypertension complicating pregnancy, unspecified trimester: Secondary | ICD-10-CM | POA: Insufficient documentation

## 2022-07-06 DIAGNOSIS — O10013 Pre-existing essential hypertension complicating pregnancy, third trimester: Secondary | ICD-10-CM

## 2022-07-08 ENCOUNTER — Encounter: Payer: Self-pay | Admitting: Advanced Practice Midwife

## 2022-07-08 NOTE — Patient Instructions (Signed)
Eat fruit only with meals, or at bedtime Stop all fried foods Walk if ok with doctor for 20 minutes daily Test blood sugars before breakfast, and 2hr. After all meals.  Keep a record given and bring to MD office at next visit Call MD office if morning blood sugars are above 100, or if 2hr. After meals are over 130.

## 2022-07-08 NOTE — Progress Notes (Unsigned)
   PRENATAL VISIT NOTE  Subjective:  Kathleen Tucker is a 46 y.o. G7P5015 at [redacted]w[redacted]d being seen today for ongoing prenatal care.  She is currently monitored for the following issues for this high-risk pregnancy and has Language barrier; ASCUS with positive high risk HPV cervical; Supervision of high risk pregnancy, antepartum; AMA (advanced maternal age) multigravida 35+, unspecified trimester; Chronic hypertension affecting pregnancy; and Gestational diabetes on their problem list.  Patient reports {[id number]}.   .  .   . Denies leaking of fluid.   The following portions of the patient's history were reviewed and updated as appropriate: allergies, current medications, past family history, past medical history, past social history, past surgical history and problem list.   Objective:  There were no vitals filed for this visit.  Fetal Status:           General:  Alert, oriented and cooperative. Patient is in no acute distress.  Skin: Skin is warm and dry. No rash noted.   Cardiovascular: Normal heart rate noted  Respiratory: Normal respiratory effort, no problems with respiration noted  Abdomen: Soft, gravid, appropriate for gestational age.        Pelvic: Cervical exam deferred        Extremities: Normal range of motion.     Mental Status: Normal mood and affect. Normal behavior. Normal judgment and thought content.   Fasting CBGs: *** (***% out of range) PCB:  *** (***% out of range) PCL:  *** (***% out of range) PCD: *** (***% out of range)   Assessment and Plan:  Pregnancy: [id number] at [redacted]w[redacted]d 1. Chronic hypertension affecting pregnancy - Labetalol *** - Antenatal testing - rec delivery 38.0-39.0 weeks***  2. Diet controlled gestational diabetes mellitus (GDM) in third trimester - CBG's *** in range  3. ASCUS with positive high risk HPV cervical - F/U Pap w/ [facility] PP  4. AMA (advanced maternal age) multigravida 35+, unspecified trimester - Antenatal testing - Delivery 39  weeks  5. Supervision of high risk pregnancy, antepartum - F/U 2 weeks  6. Language barrier - Spanish interpreter used  7. [redacted] weeks gestation of pregnancy   Preterm labor symptoms and general obstetric precautions including but not limited to vaginal bleeding, contractions, leaking of fluid and fetal movement were reviewed in detail with the patient. Please refer to After Visit Summary for other counseling recommendations.   No follow-ups on file.  Future Appointments  Date Time Provider Department Center  07/09/2022  9:35 AM Dorathy Kinsman, CNM [facility]  07/12/2022  2:30 PM [facility] NURSE [facility]  07/12/2022  2:45 PM [facility] US5 [facility]  07/19/2022  1:45 PM [facility] NURSE [facility]  07/19/2022  2:00 PM [facility] US1 [facility]  07/27/2022  1:45 PM [facility] NURSE [facility]  07/27/2022  2:00 PM [facility] US1 [facility]  08/03/2022  1:45 PM [facility] NURSE [facility]  08/03/2022  2:00 PM [facility] US1 [facility]    Dorathy Kinsman, CNM

## 2022-07-09 ENCOUNTER — Telehealth (INDEPENDENT_AMBULATORY_CARE_PROVIDER_SITE_OTHER): Payer: Self-pay | Admitting: Advanced Practice Midwife

## 2022-07-09 DIAGNOSIS — R87810 Cervical high risk human papillomavirus (HPV) DNA test positive: Secondary | ICD-10-CM

## 2022-07-09 DIAGNOSIS — Z3A34 34 weeks gestation of pregnancy: Secondary | ICD-10-CM

## 2022-07-09 DIAGNOSIS — O24410 Gestational diabetes mellitus in pregnancy, diet controlled: Secondary | ICD-10-CM

## 2022-07-09 DIAGNOSIS — Z789 Other specified health status: Secondary | ICD-10-CM

## 2022-07-09 DIAGNOSIS — R87610 Atypical squamous cells of undetermined significance on cytologic smear of cervix (ASC-US): Secondary | ICD-10-CM

## 2022-07-09 DIAGNOSIS — O0993 Supervision of high risk pregnancy, unspecified, third trimester: Secondary | ICD-10-CM

## 2022-07-09 DIAGNOSIS — O0990 Supervision of high risk pregnancy, unspecified, unspecified trimester: Secondary | ICD-10-CM

## 2022-07-09 DIAGNOSIS — O09529 Supervision of elderly multigravida, unspecified trimester: Secondary | ICD-10-CM

## 2022-07-09 DIAGNOSIS — O09523 Supervision of elderly multigravida, third trimester: Secondary | ICD-10-CM

## 2022-07-09 DIAGNOSIS — O10919 Unspecified pre-existing hypertension complicating pregnancy, unspecified trimester: Secondary | ICD-10-CM

## 2022-07-09 DIAGNOSIS — O10913 Unspecified pre-existing hypertension complicating pregnancy, third trimester: Secondary | ICD-10-CM

## 2022-07-12 ENCOUNTER — Ambulatory Visit: Payer: No Typology Code available for payment source | Admitting: *Deleted

## 2022-07-12 ENCOUNTER — Ambulatory Visit: Payer: No Typology Code available for payment source | Attending: Obstetrics and Gynecology

## 2022-07-12 VITALS — BP 130/69 | HR 71

## 2022-07-12 DIAGNOSIS — O0990 Supervision of high risk pregnancy, unspecified, unspecified trimester: Secondary | ICD-10-CM

## 2022-07-12 DIAGNOSIS — O99213 Obesity complicating pregnancy, third trimester: Secondary | ICD-10-CM

## 2022-07-12 DIAGNOSIS — Z3A34 34 weeks gestation of pregnancy: Secondary | ICD-10-CM

## 2022-07-12 DIAGNOSIS — O24410 Gestational diabetes mellitus in pregnancy, diet controlled: Secondary | ICD-10-CM | POA: Insufficient documentation

## 2022-07-12 DIAGNOSIS — O10919 Unspecified pre-existing hypertension complicating pregnancy, unspecified trimester: Secondary | ICD-10-CM | POA: Insufficient documentation

## 2022-07-12 DIAGNOSIS — O24419 Gestational diabetes mellitus in pregnancy, unspecified control: Secondary | ICD-10-CM

## 2022-07-12 DIAGNOSIS — O0943 Supervision of pregnancy with grand multiparity, third trimester: Secondary | ICD-10-CM

## 2022-07-12 DIAGNOSIS — O09523 Supervision of elderly multigravida, third trimester: Secondary | ICD-10-CM

## 2022-07-12 DIAGNOSIS — O10013 Pre-existing essential hypertension complicating pregnancy, third trimester: Secondary | ICD-10-CM

## 2022-07-12 DIAGNOSIS — Z6830 Body mass index (BMI) 30.0-30.9, adult: Secondary | ICD-10-CM | POA: Insufficient documentation

## 2022-07-19 ENCOUNTER — Encounter: Payer: Self-pay | Admitting: *Deleted

## 2022-07-19 ENCOUNTER — Ambulatory Visit: Payer: No Typology Code available for payment source | Admitting: *Deleted

## 2022-07-19 ENCOUNTER — Ambulatory Visit: Payer: No Typology Code available for payment source | Attending: Obstetrics

## 2022-07-19 VITALS — BP 148/84 | HR 71

## 2022-07-19 VITALS — BP 141/91 | HR 73

## 2022-07-19 DIAGNOSIS — O24410 Gestational diabetes mellitus in pregnancy, diet controlled: Secondary | ICD-10-CM

## 2022-07-19 DIAGNOSIS — O10013 Pre-existing essential hypertension complicating pregnancy, third trimester: Secondary | ICD-10-CM

## 2022-07-19 DIAGNOSIS — O10919 Unspecified pre-existing hypertension complicating pregnancy, unspecified trimester: Secondary | ICD-10-CM | POA: Insufficient documentation

## 2022-07-19 DIAGNOSIS — O0990 Supervision of high risk pregnancy, unspecified, unspecified trimester: Secondary | ICD-10-CM | POA: Insufficient documentation

## 2022-07-19 DIAGNOSIS — O24419 Gestational diabetes mellitus in pregnancy, unspecified control: Secondary | ICD-10-CM

## 2022-07-19 DIAGNOSIS — O0943 Supervision of pregnancy with grand multiparity, third trimester: Secondary | ICD-10-CM | POA: Insufficient documentation

## 2022-07-19 DIAGNOSIS — O09523 Supervision of elderly multigravida, third trimester: Secondary | ICD-10-CM | POA: Insufficient documentation

## 2022-07-19 DIAGNOSIS — Z3A35 35 weeks gestation of pregnancy: Secondary | ICD-10-CM

## 2022-07-19 DIAGNOSIS — O99213 Obesity complicating pregnancy, third trimester: Secondary | ICD-10-CM

## 2022-07-19 DIAGNOSIS — E669 Obesity, unspecified: Secondary | ICD-10-CM

## 2022-07-19 DIAGNOSIS — O24415 Gestational diabetes mellitus in pregnancy, controlled by oral hypoglycemic drugs: Secondary | ICD-10-CM | POA: Insufficient documentation

## 2022-07-22 ENCOUNTER — Ambulatory Visit (INDEPENDENT_AMBULATORY_CARE_PROVIDER_SITE_OTHER): Payer: Self-pay | Admitting: Obstetrics and Gynecology

## 2022-07-22 ENCOUNTER — Other Ambulatory Visit: Payer: Self-pay

## 2022-07-22 ENCOUNTER — Inpatient Hospital Stay (HOSPITAL_COMMUNITY)
Admission: AD | Admit: 2022-07-22 | Discharge: 2022-07-22 | Disposition: A | Discharge status: Home / Self Care | Payer: No Typology Code available for payment source | Attending: Obstetrics and Gynecology | Admitting: Obstetrics and Gynecology

## 2022-07-22 ENCOUNTER — Other Ambulatory Visit (HOSPITAL_COMMUNITY)
Admission: RE | Admit: 2022-07-22 | Discharge: 2022-07-22 | Disposition: A | Discharge status: Home / Self Care | Payer: Self-pay | Source: Ambulatory Visit | Attending: Obstetrics and Gynecology | Admitting: Obstetrics and Gynecology

## 2022-07-22 ENCOUNTER — Encounter (HOSPITAL_COMMUNITY): Payer: Self-pay | Admitting: Obstetrics and Gynecology

## 2022-07-22 VITALS — BP 156/88 | HR 82 | Wt 192.9 lb

## 2022-07-22 DIAGNOSIS — O99613 Diseases of the digestive system complicating pregnancy, third trimester: Secondary | ICD-10-CM | POA: Insufficient documentation

## 2022-07-22 DIAGNOSIS — Z79899 Other long term (current) drug therapy: Secondary | ICD-10-CM | POA: Insufficient documentation

## 2022-07-22 DIAGNOSIS — K5900 Constipation, unspecified: Secondary | ICD-10-CM | POA: Insufficient documentation

## 2022-07-22 DIAGNOSIS — O10919 Unspecified pre-existing hypertension complicating pregnancy, unspecified trimester: Secondary | ICD-10-CM

## 2022-07-22 DIAGNOSIS — O10913 Unspecified pre-existing hypertension complicating pregnancy, third trimester: Secondary | ICD-10-CM | POA: Insufficient documentation

## 2022-07-22 DIAGNOSIS — O09523 Supervision of elderly multigravida, third trimester: Secondary | ICD-10-CM

## 2022-07-22 DIAGNOSIS — Z3A36 36 weeks gestation of pregnancy: Secondary | ICD-10-CM

## 2022-07-22 DIAGNOSIS — O26893 Other specified pregnancy related conditions, third trimester: Secondary | ICD-10-CM

## 2022-07-22 DIAGNOSIS — O0990 Supervision of high risk pregnancy, unspecified, unspecified trimester: Secondary | ICD-10-CM

## 2022-07-22 DIAGNOSIS — Z758 Other problems related to medical facilities and other health care: Secondary | ICD-10-CM

## 2022-07-22 DIAGNOSIS — O24410 Gestational diabetes mellitus in pregnancy, diet controlled: Secondary | ICD-10-CM

## 2022-07-22 DIAGNOSIS — R519 Headache, unspecified: Secondary | ICD-10-CM

## 2022-07-22 DIAGNOSIS — O0993 Supervision of high risk pregnancy, unspecified, third trimester: Secondary | ICD-10-CM

## 2022-07-22 DIAGNOSIS — O24113 Pre-existing diabetes mellitus, type 2, in pregnancy, third trimester: Secondary | ICD-10-CM | POA: Insufficient documentation

## 2022-07-22 DIAGNOSIS — H538 Other visual disturbances: Secondary | ICD-10-CM | POA: Insufficient documentation

## 2022-07-22 DIAGNOSIS — Z789 Other specified health status: Secondary | ICD-10-CM

## 2022-07-22 DIAGNOSIS — O09529 Supervision of elderly multigravida, unspecified trimester: Secondary | ICD-10-CM

## 2022-07-22 LAB — CBC
HCT: 34.5 % — ABNORMAL LOW (ref 36.0–46.0)
Hemoglobin: 11.4 g/dL — ABNORMAL LOW (ref 12.0–15.0)
MCH: 26.5 pg (ref 26.0–34.0)
MCHC: 33 g/dL (ref 30.0–36.0)
MCV: 80 fL (ref 80.0–100.0)
Platelets: 217 10*3/uL (ref 150–400)
RBC: 4.31 MIL/uL (ref 3.87–5.11)
RDW: 14.2 % (ref 11.5–15.5)
WBC: 6 10*3/uL (ref 4.0–10.5)
nRBC: 0 % (ref 0.0–0.2)

## 2022-07-22 LAB — COMPREHENSIVE METABOLIC PANEL
ALT: 17 U/L (ref 0–44)
AST: 22 U/L (ref 15–41)
Albumin: 3 g/dL — ABNORMAL LOW (ref 3.5–5.0)
Alkaline Phosphatase: 98 U/L (ref 38–126)
Anion gap: 12 (ref 5–15)
BUN: 17 mg/dL (ref 6–20)
CO2: 19 mmol/L — ABNORMAL LOW (ref 22–32)
Calcium: 9.2 mg/dL (ref 8.9–10.3)
Chloride: 106 mmol/L (ref 98–111)
Creatinine, Ser: 0.68 mg/dL (ref 0.44–1.00)
GFR, Estimated: >60 mL/min (ref >60)
Glucose, Bld: 98 mg/dL (ref 70–99)
Potassium: 3.8 mmol/L (ref 3.5–5.1)
Sodium: 137 mmol/L (ref 135–145)
Total Bilirubin: 0.6 mg/dL (ref 0.3–1.2)
Total Protein: 6.6 g/dL (ref 6.5–8.1)

## 2022-07-22 LAB — URINALYSIS, ROUTINE W REFLEX MICROSCOPIC
Bilirubin Urine: NEGATIVE
Glucose, UA: NEGATIVE mg/dL
Hgb urine dipstick: NEGATIVE
Ketones, ur: NEGATIVE mg/dL
Leukocytes,Ua: NEGATIVE
Nitrite: NEGATIVE
Protein, ur: NEGATIVE mg/dL
Specific Gravity, Urine: 1.027 (ref 1.005–1.030)
pH: 5 (ref 5.0–8.0)

## 2022-07-22 LAB — PROTEIN / CREATININE RATIO, URINE
Creatinine, Urine: 144 mg/dL
Protein Creatinine Ratio: 0.08 mg/mg{Cre} (ref 0.00–0.15)
Total Protein, Urine: 12 mg/dL

## 2022-07-22 MED ORDER — ACETAMINOPHEN 500 MG PO TABS
1000.0000 mg | ORAL_TABLET | Freq: Once | ORAL | Status: AC
Start: 2022-07-22 — End: 2022-07-22
  Administered 2022-07-22: 1000 mg via ORAL
  Filled 2022-07-22: qty 2

## 2022-07-22 MED ORDER — CYCLOBENZAPRINE HCL 10 MG PO TABS: 10.0000 mg | ORAL_TABLET | Freq: Two times a day (BID) | ORAL | 0 refills | Status: DC | PRN

## 2022-07-22 MED ORDER — METOCLOPRAMIDE HCL 5 MG/ML IJ SOLN
10.0000 mg | Freq: Once | INTRAMUSCULAR | Status: AC
  Administered 2022-07-22: 10 mg via INTRAVENOUS
  Filled 2022-07-22: qty 2

## 2022-07-22 MED ORDER — LABETALOL HCL 200 MG PO TABS: 200.0000 mg | ORAL_TABLET | Freq: Three times a day (TID) | ORAL | 3 refills | Status: DC

## 2022-07-22 MED ORDER — LACTATED RINGERS IV BOLUS
1000.0000 mL | Freq: Once | INTRAVENOUS | Status: AC
Start: 2022-07-22 — End: 2022-07-22
  Administered 2022-07-22: 1000 mL via INTRAVENOUS

## 2022-07-22 MED ORDER — CYCLOBENZAPRINE HCL 5 MG PO TABS
10.0000 mg | ORAL_TABLET | Freq: Once | ORAL | Status: AC
Start: 2022-07-22 — End: 2022-07-22
  Administered 2022-07-22: 10 mg via ORAL
  Filled 2022-07-22: qty 2

## 2022-07-22 NOTE — MAU Provider Note (Signed)
History    CSN: [id number]  Arrival date and time: 07/22/22 1642   None     Chief Complaint  Patient presents with   Headache   Hypertension   Kathleen Tucker is a 46 y.o. U2G2542 at [redacted]w[redacted]d with a history of A1GDM and AMA, who presents to the MAU today with a HA and blurry vision in the setting of chronic hypertension. Pt was seen today at [facility] for prenatal care and was referred to MAU for serial BP, labs, and possible admission for delivery due to elevated blood pressure and end organ symptoms. Patient reports a headache for more than one week. She describes it as a constant 6/10, 10 being the worse. It is not relieved by tylenol, which she last took last night. It affects her daily routine and is accompanied by blurry vision. The headache is frontal and at the back of the head, she describes it as her head weighing too much for her neck. She also endorses decreased appetite and constipation for the past 4 days.   She reports good fetal movement, denies chest pain, shortness of breath, LOF, RUQ pain, vaginal bleeding, vaginal itching/burning, urinary symptoms, n/v, diarrhea, or fever/chills.     Headache  Her past medical history is significant for hypertension.  Hypertension Associated symptoms include headaches.    OB History     Gravida  7   Para  5   Term  5   Preterm      AB  1   Living  5      SAB  1   IAB      Ectopic      Multiple  0   Live Births  5           Past Medical History:  Diagnosis Date   AMA (advanced maternal age) multigravida 35+    Anemia    Anxiety    celexa   Diabetes mellitus without complication (HCC) 2016   current/last pregnancy   Gestational diabetes 01/27/2022    History reviewed. No pertinent surgical history.  Family History  Problem Relation Age of Onset   Diabetes Mother    Diabetes Father    Asthma Neg Hx    Cancer Neg Hx    Heart disease Neg Hx    Hypertension Neg Hx    Stroke Neg Hx     Social  History   Tobacco Use   Smoking status: Never   Smokeless tobacco: Never  Vaping Use   Vaping Use: Never used  Substance Use Topics   Alcohol use: No   Drug use: No    Allergies: No Known Allergies  Medications Prior to Admission  Medication Sig Dispense Refill Last Dose   aspirin EC 81 MG tablet Take 1 tablet (81 mg total) by mouth daily. Take after 12 weeks for prevention of preeclampsia later in pregnancy 300 tablet 2    prenatal vitamin w/FE, FA (PRENATAL 1 + 1) 27-1 MG TABS tablet Take 1 tablet by mouth daily at 12 noon.      [DISCONTINUED] labetalol (NORMODYNE) 200 MG tablet Take 1 tablet (200 mg total) by mouth 2 (two) times daily. 60 tablet 3     Review of Systems  Neurological:  Positive for headaches.  Pertinent positives and negative per HPI, all others reviewed and negative   Physical Exam   Blood pressure 135/78, pulse 67, temperature 97.9 F (36.6 C), temperature source Oral, resp. rate 18, height 5\' 2"  (1.575 m), weight  87.9 kg, last menstrual period 11/04/2021, SpO2 99 %, unknown if currently breastfeeding.  Physical Exam Vitals reviewed.  Constitutional:      General: She is not in acute distress.    Appearance: She is well-developed. She is not diaphoretic.  Eyes:     General: No scleral icterus. Pulmonary:     Effort: Pulmonary effort is normal. No respiratory distress.  Abdominal:     General: There is no distension.     Palpations: Abdomen is soft.     Tenderness: There is no abdominal tenderness. There is no guarding or rebound.  Skin:    General: Skin is warm and dry.  Neurological:     Mental Status: She is alert.     Coordination: Coordination normal.    FHR Tracing Baseline 140 Variability: moderate Accels: present Decels: absent Toco: rare contractions  Cat I  MAU Course  Procedures  MDM - UA - Protein/Creatinine ratio - CBC - CMP  Results for orders placed or performed during the hospital encounter of 07/22/22 (from the  past 24 hour(s))  Urinalysis, Routine w reflex microscopic Urine, Clean Catch     Status: Abnormal   Collection Time: 07/22/22  5:15 PM  Result Value Ref Range   Color, Urine YELLOW YELLOW   APPearance HAZY (A) CLEAR   Specific Gravity, Urine 1.027 1.005 - 1.030   pH 5.0 5.0 - 8.0   Glucose, UA NEGATIVE NEGATIVE mg/dL   Hgb urine dipstick NEGATIVE NEGATIVE   Bilirubin Urine NEGATIVE NEGATIVE   Ketones, ur NEGATIVE NEGATIVE mg/dL   Protein, ur NEGATIVE NEGATIVE mg/dL   Nitrite NEGATIVE NEGATIVE   Leukocytes,Ua NEGATIVE NEGATIVE  Protein / creatinine ratio, urine     Status: None   Collection Time: 07/22/22  5:15 PM  Result Value Ref Range   Creatinine, Urine 144 mg/dL   Total Protein, Urine 12 mg/dL   Protein Creatinine Ratio 0.08 0.00 - 0.15 mg/mg[Cre]  CBC     Status: Abnormal   Collection Time: 07/22/22  5:21 PM  Result Value Ref Range   WBC 6.0 4.0 - 10.5 K/uL   RBC 4.31 3.87 - 5.11 MIL/uL   Hemoglobin 11.4 (L) 12.0 - 15.0 g/dL   HCT 34.5 (L) 36.0 - 46.0 %   MCV 80.0 80.0 - 100.0 fL   MCH 26.5 26.0 - 34.0 pg   MCHC 33.0 30.0 - 36.0 g/dL   RDW 14.2 11.5 - 15.5 %   Platelets 217 150 - 400 K/uL   nRBC 0.0 0.0 - 0.2 %  Comprehensive metabolic panel     Status: Abnormal   Collection Time: 07/22/22  5:21 PM  Result Value Ref Range   Sodium 137 135 - 145 mmol/L   Potassium 3.8 3.5 - 5.1 mmol/L   Chloride 106 98 - 111 mmol/L   CO2 19 (L) 22 - 32 mmol/L   Glucose, Bld 98 70 - 99 mg/dL   BUN 17 6 - 20 mg/dL   Creatinine, Ser 0.68 0.44 - 1.00 mg/dL   Calcium 9.2 8.9 - 10.3 mg/dL   Total Protein 6.6 6.5 - 8.1 g/dL   Albumin 3.0 (L) 3.5 - 5.0 g/dL   AST 22 15 - 41 U/L   ALT 17 0 - 44 U/L   Alkaline Phosphatase 98 38 - 126 U/L   Total Bilirubin 0.6 0.3 - 1.2 mg/dL   GFR, Estimated >60 >60 mL/min   Anion gap 12 5 - 15     Assessment and Plan  1.Headache in pregnancy Resolved with tylenol, IVF, flexeril, reglan. Reports vision is now normal as well.   2. Chronic  hypertension in pregnancy Worsening over the past few days. Increased labetalol to 200mg  TID. Discussed with her that she will likely need a IOL around 37-38 weeks.   3. FWB FHT Cat I NST reactive  Disposition: discharged to home in stable condition.   07/22/2022, 7:43 PM    ATTENDING ATTESTATION  I have seen and examined this patient and agree with the above documentation in the medical student's note except as below.  I have edited the above note for accuracy and clarity  07/24/2022, MD/[facility] (Faculty Practice) 07/22/2022, 7:44 PM

## 2022-07-22 NOTE — Progress Notes (Signed)
   PRENATAL VISIT NOTE  Subjective:  Kathleen Tucker is a 46 y.o. G7P5015 at [redacted]w[redacted]d being seen today for ongoing prenatal care.  She is currently monitored for the following issues for this high-risk pregnancy and has Language barrier; ASCUS with positive high risk HPV cervical; Supervision of high risk pregnancy, antepartum; AMA (advanced maternal age) multigravida 35+, unspecified trimester; Chronic hypertension affecting pregnancy; and Gestational diabetes on their problem list.  Patient doing well with no acute concerns today. She reports headache and visual changes for about 1 week.  She denies RUQ pain .  Contractions: Irritability. Vag. Bleeding: None.  Movement: Present. Denies leaking of fluid.   The following portions of the patient's history were reviewed and updated as appropriate: allergies, current medications, past family history, past medical history, past social history, past surgical history and problem list. Problem list updated.  Objective:   Vitals:   07/22/22 1550 07/22/22 1552  BP: (!) 156/88 (!) 156/88  Pulse: 82 82  Weight: 192 lb 14.4 oz (87.5 kg)     Fetal Status: Fetal Heart Rate (bpm): 154 Fundal Height: 36 cm Movement: Present     General:  Alert, oriented and cooperative. Patient is in no acute distress.  Skin: Skin is warm and dry. No rash noted.   Cardiovascular: Normal heart rate noted  Respiratory: Normal respiratory effort, no problems with respiration noted  Abdomen: Soft, gravid, appropriate for gestational age.  Pain/Pressure: Present     Pelvic: Cervical exam performed Dilation: Fingertip Effacement (%): 40 Station: Ballotable  Extremities: Normal range of motion.  Edema: Trace  Mental Status:  Normal mood and affect. Normal behavior. Normal judgment and thought content.   Assessment and Plan:  Pregnancy: [id number] at [redacted]w[redacted]d  1. Supervision of high risk pregnancy, antepartum Due to elevated blood pressure and end organ symptoms will send pt to  MAU for serial BP, labs and possible admission for delivery  - GC/Chlamydia probe amp ([place])not at [facility] - Culture, beta strep (group b only)  2. [redacted] weeks gestation of pregnancy   3. Chronic hypertension affecting pregnancy Pt continues with labetalol, but pressure elevated above usual baseline. If undelivered would schedule for IOL at 37 weeks  4. Diet controlled gestational diabetes mellitus (GDM) in third trimester   5. Language barrier Live interpreter present  6. AMA (advanced maternal age) multigravida 46+, unspecified trimester   Preterm labor symptoms and general obstetric precautions including but not limited to vaginal bleeding, contractions, leaking of fluid and fetal movement were reviewed in detail with the patient.  Please refer to After Visit Summary for other counseling recommendations.   Return in about 1 week (around 07/29/2022) for in person. If undelivered Pt to MAU for further evaluation.   Lynnda Shields, MD Faculty Attending Center for [facility]

## 2022-07-22 NOTE — MAU Note (Signed)
Headache for a week and high blood pressure.Took tylenol but it did not help.

## 2022-07-22 NOTE — Discharge Instructions (Signed)
Increase your blood pressure medicine from twice daily to three times daily to help control your blood pressure.

## 2022-07-22 NOTE — MAU Note (Signed)
Kathleen Tucker is a 46 y.o. at [redacted]w[redacted]d here in MAU reporting: HA for a wk, has taken Tylenol, no relief.  Has chronic Hypertension, is on meds.  Blurred vision started with the HA. Denies epigastric pain, reports increased swelling.  Denies bleeding or LOF, reports +FM  Onset of complaint: 7dys Pain score: 5 Vitals:   07/22/22 1656  BP: (!) 142/88  Pulse: 70  Resp: 18  Temp: 97.9 F (36.6 C)  SpO2: 100%     MVE:HMCNOB dress on, reports +FM Lab orders placed from triage:  urine

## 2022-07-23 LAB — GC/CHLAMYDIA PROBE AMP (~~LOC~~) NOT AT ARMC
Chlamydia: POSITIVE — AB
Comment: NEGATIVE
Comment: NORMAL
Neisseria Gonorrhea: NEGATIVE

## 2022-07-26 ENCOUNTER — Telehealth: Payer: Self-pay | Admitting: *Deleted

## 2022-07-26 LAB — CULTURE, BETA STREP (GROUP B ONLY): Strep Gp B Culture: NEGATIVE

## 2022-07-26 MED ORDER — AZITHROMYCIN 250 MG PO TABS: 1000.0000 mg | ORAL_TABLET | Freq: Once | ORAL | 0 refills | Status: AC

## 2022-07-26 NOTE — Telephone Encounter (Addendum)
-----   Message from Griffin Basil, MD sent at 07/25/2022  1:05 PM EDT ----- Chlamydia positive, offer treatment  9/25  1640  Called pt w/interpreter Rosemarie Ax.  She was informed of +Chlamydia infection and antibiotic treatment needed.  Rx sent to pharmacy.  Pt was also informed that her partner requires treatment as well and can schedule appointment @ [facility]. They should not have sex until 2 weeks after they both have been treated. Pt voiced understanding.

## 2022-07-27 ENCOUNTER — Ambulatory Visit: Payer: No Typology Code available for payment source | Admitting: *Deleted

## 2022-07-27 ENCOUNTER — Ambulatory Visit: Payer: No Typology Code available for payment source | Attending: Obstetrics

## 2022-07-27 ENCOUNTER — Encounter: Payer: Self-pay | Admitting: *Deleted

## 2022-07-27 VITALS — BP 144/76 | HR 79

## 2022-07-27 DIAGNOSIS — E669 Obesity, unspecified: Secondary | ICD-10-CM

## 2022-07-27 DIAGNOSIS — O24415 Gestational diabetes mellitus in pregnancy, controlled by oral hypoglycemic drugs: Secondary | ICD-10-CM | POA: Insufficient documentation

## 2022-07-27 DIAGNOSIS — O10013 Pre-existing essential hypertension complicating pregnancy, third trimester: Secondary | ICD-10-CM

## 2022-07-27 DIAGNOSIS — O0990 Supervision of high risk pregnancy, unspecified, unspecified trimester: Secondary | ICD-10-CM | POA: Insufficient documentation

## 2022-07-27 DIAGNOSIS — O10919 Unspecified pre-existing hypertension complicating pregnancy, unspecified trimester: Secondary | ICD-10-CM | POA: Insufficient documentation

## 2022-07-27 DIAGNOSIS — O24410 Gestational diabetes mellitus in pregnancy, diet controlled: Secondary | ICD-10-CM

## 2022-07-27 DIAGNOSIS — O09523 Supervision of elderly multigravida, third trimester: Secondary | ICD-10-CM | POA: Insufficient documentation

## 2022-07-27 DIAGNOSIS — O99213 Obesity complicating pregnancy, third trimester: Secondary | ICD-10-CM

## 2022-07-27 DIAGNOSIS — O0943 Supervision of pregnancy with grand multiparity, third trimester: Secondary | ICD-10-CM | POA: Insufficient documentation

## 2022-07-27 DIAGNOSIS — Z3A36 36 weeks gestation of pregnancy: Secondary | ICD-10-CM

## 2022-07-29 ENCOUNTER — Ambulatory Visit (INDEPENDENT_AMBULATORY_CARE_PROVIDER_SITE_OTHER): Payer: Self-pay | Admitting: Obstetrics and Gynecology

## 2022-07-29 ENCOUNTER — Other Ambulatory Visit: Payer: Self-pay

## 2022-07-29 ENCOUNTER — Encounter: Payer: Self-pay | Admitting: Obstetrics and Gynecology

## 2022-07-29 VITALS — BP 148/88 | HR 79 | Wt 194.7 lb

## 2022-07-29 DIAGNOSIS — O0990 Supervision of high risk pregnancy, unspecified, unspecified trimester: Secondary | ICD-10-CM

## 2022-07-29 DIAGNOSIS — Z789 Other specified health status: Secondary | ICD-10-CM

## 2022-07-29 DIAGNOSIS — O10919 Unspecified pre-existing hypertension complicating pregnancy, unspecified trimester: Secondary | ICD-10-CM

## 2022-07-29 DIAGNOSIS — O24410 Gestational diabetes mellitus in pregnancy, diet controlled: Secondary | ICD-10-CM

## 2022-07-29 DIAGNOSIS — O09529 Supervision of elderly multigravida, unspecified trimester: Secondary | ICD-10-CM

## 2022-07-29 DIAGNOSIS — O98813 Other maternal infectious and parasitic diseases complicating pregnancy, third trimester: Secondary | ICD-10-CM

## 2022-07-29 DIAGNOSIS — Z3A37 37 weeks gestation of pregnancy: Secondary | ICD-10-CM

## 2022-07-29 DIAGNOSIS — A749 Chlamydial infection, unspecified: Secondary | ICD-10-CM

## 2022-07-29 MED ORDER — AZITHROMYCIN 500 MG PO TABS: 1000.0000 mg | ORAL_TABLET | Freq: Once | ORAL | 0 refills | Status: AC

## 2022-07-29 MED ORDER — ONDANSETRON HCL 8 MG PO TABS: 8.0000 mg | ORAL_TABLET | Freq: Three times a day (TID) | ORAL | 0 refills | Status: DC | PRN

## 2022-07-29 NOTE — Progress Notes (Signed)
Subjective:  Kathleen Tucker is a 46 y.o. G7P5015 at [redacted]w[redacted]d being seen today for ongoing prenatal care.  She is currently monitored for the following issues for this high-risk pregnancy and has Language barrier; ASCUS with positive high risk HPV cervical; Supervision of high risk pregnancy, antepartum; AMA (advanced maternal age) multigravida 35+, unspecified trimester; Chronic hypertension affecting pregnancy; Gestational diabetes; and Chlamydia infection affecting pregnancy on their problem list.  Patient reports general discomforts of pregnancy.  Contractions: Irregular. Vag. Bleeding: None.  Movement: Present. Denies leaking of fluid.   The following portions of the patient's history were reviewed and updated as appropriate: allergies, current medications, past family history, past medical history, past social history, past surgical history and problem list. Problem list updated.  Objective:   Vitals:   07/29/22 1119  BP: (!) 148/88  Pulse: 79  Weight: 194 lb 11.2 oz (88.3 kg)    Fetal Status: Fetal Heart Rate (bpm): 146   Movement: Present     General:  Alert, oriented and cooperative. Patient is in no acute distress.  Skin: Skin is warm and dry. No rash noted.   Cardiovascular: Normal heart rate noted  Respiratory: Normal respiratory effort, no problems with respiration noted  Abdomen: Soft, gravid, appropriate for gestational age. Pain/Pressure: Present     Pelvic:  Cervical exam performed        Extremities: Normal range of motion.  Edema: Trace  Mental Status: Normal mood and affect. Normal behavior. Normal judgment and thought content.   Urinalysis:      Assessment and Plan:  Pregnancy: [id number] at [redacted]w[redacted]d  1. Supervision of high risk pregnancy, antepartum Stable Labor precautions IOL scheduled  2. Chronic hypertension affecting pregnancy BP stable Continue with current management Serial growth scans and antenatal testing as per MFM IOL as noted above  3. Diet  controlled gestational diabetes mellitus (GDM) in third trimester CBG in goal U/S and IOL as noted above  4. AMA (advanced maternal age) multigravida 82+, unspecified trimester Stable  5. Language barrier Live interrupter used during today's visit  6. Chlamydia infection affecting pregnancy in third trimester Dicussed with pt - azithromycin (ZITHROMAX) 500 MG tablet; Take 2 tablets (1,000 mg total) by mouth once for 1 dose.  Dispense: 2 tablet; Refill: 0 - ondansetron (ZOFRAN) 8 MG tablet; Take 1 tablet (8 mg total) by mouth every 8 (eight) hours as needed for nausea or vomiting.  Dispense: 5 tablet; Refill: 0  Term labor symptoms and general obstetric precautions including but not limited to vaginal bleeding, contractions, leaking of fluid and fetal movement were reviewed in detail with the patient. Please refer to After Visit Summary for other counseling recommendations.  No follow-ups on file.   Chancy Milroy, MD

## 2022-07-29 NOTE — Progress Notes (Signed)
IOL scheduled on 08/12/22 am

## 2022-07-29 NOTE — Patient Instructions (Signed)
Parto vaginal Vaginal Delivery  Parto vaginal significa que usted da a luz empujando al beb fuera del canal del parto (vagina). Su equipo de atencin mdica la ayudar antes, durante y despus del parto vaginal. Las experiencias de los nacimientos son nicas para todas las [place], y Engineer, technical sales y las experiencias de nacimiento varan segn dnde elija dar a luz. Cules son los riesgos y beneficios? En general, esto es seguro. Sin embargo, pueden ocurrir complicaciones, por ejemplo: Sangrado. Infeccin. Dao a otras estructuras, como desgarro vaginal. Reacciones alrgicas a los medicamentos. A pesar de los riesgos, los beneficios del parto vaginal incluyen un menor riesgo de sangrado e infeccin y un menor tiempo de recuperacin en comparacin con el parto por cesrea. El parto por cesrea es el parto del beb por medios quirrgicos. Sander Nephew ocurrir cuando llegue al centro de [place] o al hospital? [place]ene [place] que se inicie el [place] de parto y haya sido admitida en el hospital o centro de [place], su equipo de atencin mdica podr hacer lo siguiente: Revisar sus antecedentes de Media planner y cualquier inquietud que usted pueda Best boy. Hablar con usted sobre su plan de nacimiento y Physiological scientist las opciones para Financial controller. Controlarle la presin arterial, la respiracin y los latidos cardacos. Evaluar los latidos cardacos del beb. Monitorear el tero para TEFL teacher. Verificar si la bolsa de agua (saco amnitico) se ha roto (ruptura). Colocarle una va intravenosa en una de las venas. Esto se podr usar para administrarle lquidos y medicamentos. Monitoreo Su equipo de atencin mdica puede Chief of Staff las contracciones (monitoreo uterino) y la frecuencia cardaca del beb (monitoreo fetal). Es posible que el monitoreo se necesite realizar: Con frecuencia, pero no continuamente (intermitentemente). Todo el tiempo o durante largos perodos a la vez (continuamente). El monitoreo  continuo puede ser necesario si: Est recibiendo determinados medicamentos, tales como medicamentos para Best boy o para hacer que las contracciones sean ms fuertes. Tiene complicaciones durante el [place]. El monitoreo se puede realizar: Al colocar un estetoscopio especial o un dispositivo manual de monitoreo en el abdomen o verificar los latidos cardacos del beb y comprobar las contracciones. Al colocar monitores en el abdomen (monitores externos) para Museum/gallery curator los latidos cardacos del beb y la frecuencia y [place] contracciones. Al colocar monitores dentro del tero a travs de la vagina (monitores internos) para Museum/gallery curator los latidos cardacos del beb y la frecuencia, duracin y fuerza de sus contracciones. Segn el tipo de monitor, International aid/development worker en el tero o en la cabeza del beb hasta el nacimiento. Telemetra. Se trata de un tipo de monitoreo continuo que se puede Optometrist con monitores externos o internos. En lugar de Passenger transport manager en la cama, usted puede desplazarse. Examen fsico Su equipo de atencin mdica puede Human resources officer fsicos frecuentes. Esto puede incluir: Musician cmo y dnde el beb est ubicado en el tero. Verificar el cuello uterino para determinar: Si se est afinando o estirando (borrando). Si se est abriendo (dilatando). [place] parto y [place]?  [place] de parto y el parto normales se dividen en tres etapas: Etapa 1 Esta es la etapa ms larga del trabajo de [place]. Durante esta etapa, sentir contracciones. En general, las contracciones son leves, infrecuentes e irregulares al principio. Se hacen ms fuertes, ms frecuentes y ms regulares a medida que avanza en esta etapa. Puede tener contracciones cada 2 o 3 minutos. Esta etapa finaliza cuando el cuello uterino est completamente  dilatado hasta 4 pulgadas (10 cm) y completamente borrado. Etapa 2 Esta etapa comienza una  vez que el cuello uterino est totalmente borrado y dilatado, y dura hasta el nacimiento del beb. Esta es la etapa en la que va a sentir ganas de pujar al beb fuera de la vagina. Puede sentir un dolor urente y por estiramiento, especialmente cuando la parte ms ancha de la cabeza del beb pasa a travs de la abertura vaginal (coronacin). Una vez que el beb nace, el cordn umbilical se pinzar y se cortar. El momento de cortar el cordn depender de sus deseos, de la salud del beb y de los mtodos del mdico. Clinical biochemist al beb sobre su pecho desnudo (contacto piel con piel) en una posicin erguida y [place] con [place] abrigada. Si optar por amamantar, observe al beb para detectar seales de hambre, como el reflejo de bsqueda o succin, y acrquelo al pecho para su primera alimentacin. Etapa 3 Esta etapa comienza inmediatamente despus del nacimiento del beb y finaliza despus de la expulsin de la placenta. Esta etapa puede durar de 5 a 30 minutos. Despus del nacimiento del beb, puede sentir contracciones cuando el cuerpo expulsa la placenta. Estas contracciones tambin ayudan a que el tero reduzca su tamao y a Warehouse manager. Qu puedo esperar despus del Aleen Campi de parto y [place]? Una vez que termina el [place] de [place], se los evaluar a usted y al beb atentamente hasta que estn listos para ir a casa. Su equipo de atencin Art gallery manager cmo cuidarse y cuidar a su beb. Le recomendarn que usted y el beb permanezcan en la misma sala (cohabitacin) durante su estada en el hospital. Esto ayudar a fomentar un vnculo temprano y Administrator, Civil Service. Se le controlar y Engineer, maintenance (IT) el tero con regularidad (masaje fndico). Puede seguir recibiendo lquidos o medicamentos por va intravenosa. Tendr algo de inflamacin y dolor en el abdomen, la vagina y la zona de la piel entre la abertura vaginal y el ano (perineo). Si se le realiz una incisin cerca de la vagina  (episiotoma) o si ha tenido Systems developer vaginal durante el parto, podran indicarle que se coloque compresas fras sobre la episiotoma o Art therapist. Esto ayuda a Engineer, materials y la hinchazn. Es normal tener hemorragia vaginal despus del [place]. Use un apsito sanitario para el sangrado vaginal y secrecin. Resumen Parto vaginal significa que usted dar a luz empujando al beb fuera del canal del parto (vagina). Su equipo de atencin mdica lo controlar a usted y al beb durante las etapas del [place]. Despus del parto, su equipo de atencin mdica continuar evalundolos a usted y al beb para asegurarse de que ambos se estn recuperando segn lo esperado despus del parto. Esta informacin no tiene Theme park manager el consejo del mdico. Asegrese de hacerle al mdico cualquier pregunta que tenga. Document Revised: 09/23/2020 Document Reviewed: 09/23/2020 Elsevier Patient Education  2023 [employer].

## 2022-07-30 NOTE — Progress Notes (Unsigned)
Communicable disease report sent to health department for recent Chlamydia infection.

## 2022-08-03 ENCOUNTER — Inpatient Hospital Stay (HOSPITAL_COMMUNITY)
Admission: AD | Admit: 2022-08-03 | Discharge: 2022-08-03 | Disposition: A | Discharge status: Home / Self Care | Payer: No Typology Code available for payment source | Attending: Obstetrics and Gynecology | Admitting: Obstetrics and Gynecology

## 2022-08-03 ENCOUNTER — Ambulatory Visit: Payer: No Typology Code available for payment source | Attending: Obstetrics

## 2022-08-03 ENCOUNTER — Encounter: Payer: Self-pay | Admitting: *Deleted

## 2022-08-03 ENCOUNTER — Other Ambulatory Visit: Payer: Self-pay | Admitting: *Deleted

## 2022-08-03 ENCOUNTER — Encounter (HOSPITAL_COMMUNITY): Payer: Self-pay | Admitting: Obstetrics and Gynecology

## 2022-08-03 ENCOUNTER — Ambulatory Visit: Payer: No Typology Code available for payment source | Admitting: *Deleted

## 2022-08-03 VITALS — BP 147/82 | HR 80

## 2022-08-03 DIAGNOSIS — Z3A37 37 weeks gestation of pregnancy: Secondary | ICD-10-CM

## 2022-08-03 DIAGNOSIS — O24410 Gestational diabetes mellitus in pregnancy, diet controlled: Secondary | ICD-10-CM

## 2022-08-03 DIAGNOSIS — O10919 Unspecified pre-existing hypertension complicating pregnancy, unspecified trimester: Secondary | ICD-10-CM

## 2022-08-03 DIAGNOSIS — O09523 Supervision of elderly multigravida, third trimester: Secondary | ICD-10-CM

## 2022-08-03 DIAGNOSIS — R030 Elevated blood-pressure reading, without diagnosis of hypertension: Secondary | ICD-10-CM | POA: Insufficient documentation

## 2022-08-03 DIAGNOSIS — O0943 Supervision of pregnancy with grand multiparity, third trimester: Secondary | ICD-10-CM | POA: Insufficient documentation

## 2022-08-03 DIAGNOSIS — O10913 Unspecified pre-existing hypertension complicating pregnancy, third trimester: Secondary | ICD-10-CM | POA: Insufficient documentation

## 2022-08-03 DIAGNOSIS — O24415 Gestational diabetes mellitus in pregnancy, controlled by oral hypoglycemic drugs: Secondary | ICD-10-CM | POA: Insufficient documentation

## 2022-08-03 DIAGNOSIS — E669 Obesity, unspecified: Secondary | ICD-10-CM

## 2022-08-03 DIAGNOSIS — O0990 Supervision of high risk pregnancy, unspecified, unspecified trimester: Secondary | ICD-10-CM

## 2022-08-03 DIAGNOSIS — O99213 Obesity complicating pregnancy, third trimester: Secondary | ICD-10-CM

## 2022-08-03 DIAGNOSIS — O26899 Other specified pregnancy related conditions, unspecified trimester: Secondary | ICD-10-CM

## 2022-08-03 DIAGNOSIS — O26893 Other specified pregnancy related conditions, third trimester: Secondary | ICD-10-CM | POA: Insufficient documentation

## 2022-08-03 DIAGNOSIS — O10013 Pre-existing essential hypertension complicating pregnancy, third trimester: Secondary | ICD-10-CM

## 2022-08-03 DIAGNOSIS — R519 Headache, unspecified: Secondary | ICD-10-CM

## 2022-08-03 DIAGNOSIS — O24113 Pre-existing diabetes mellitus, type 2, in pregnancy, third trimester: Secondary | ICD-10-CM | POA: Insufficient documentation

## 2022-08-03 LAB — COMPREHENSIVE METABOLIC PANEL
ALT: 15 U/L (ref 0–44)
AST: 18 U/L (ref 15–41)
Albumin: 2.9 g/dL — ABNORMAL LOW (ref 3.5–5.0)
Alkaline Phosphatase: 105 U/L (ref 38–126)
Anion gap: 8 (ref 5–15)
BUN: 13 mg/dL (ref 6–20)
CO2: 21 mmol/L — ABNORMAL LOW (ref 22–32)
Calcium: 8.6 mg/dL — ABNORMAL LOW (ref 8.9–10.3)
Chloride: 108 mmol/L (ref 98–111)
Creatinine, Ser: 0.72 mg/dL (ref 0.44–1.00)
GFR, Estimated: >60 mL/min (ref >60)
Glucose, Bld: 99 mg/dL (ref 70–99)
Potassium: 3.6 mmol/L (ref 3.5–5.1)
Sodium: 137 mmol/L (ref 135–145)
Total Bilirubin: 0.5 mg/dL (ref 0.3–1.2)
Total Protein: 6.3 g/dL — ABNORMAL LOW (ref 6.5–8.1)

## 2022-08-03 LAB — PROTEIN / CREATININE RATIO, URINE
Creatinine, Urine: 204 mg/dL
Protein Creatinine Ratio: 0.09 mg/mg{Cre} (ref 0.00–0.15)
Total Protein, Urine: 19 mg/dL

## 2022-08-03 LAB — CBC
HCT: 34.6 % — ABNORMAL LOW (ref 36.0–46.0)
Hemoglobin: 11.4 g/dL — ABNORMAL LOW (ref 12.0–15.0)
MCH: 26.3 pg (ref 26.0–34.0)
MCHC: 32.9 g/dL (ref 30.0–36.0)
MCV: 79.9 fL — ABNORMAL LOW (ref 80.0–100.0)
Platelets: 201 10*3/uL (ref 150–400)
RBC: 4.33 MIL/uL (ref 3.87–5.11)
RDW: 14.5 % (ref 11.5–15.5)
WBC: 6.1 10*3/uL (ref 4.0–10.5)
nRBC: 0 % (ref 0.0–0.2)

## 2022-08-03 NOTE — MAU Provider Note (Addendum)
History     CSN: [id number]  Arrival date and time: 08/03/22 1744   Event Date/Time   First Provider Initiated Contact with Patient 08/03/22 1832      Chief Complaint  Patient presents with   Hypertension   Headache   46 y.o. [id number] @37 .5 wks sent from [facility] for elevated BP. Hx of CHTN on Labetalol TID. Reports regular frontal and temporal HAs over the last month. She took Tylenol today which helped. Reports blurry vision when she has a HA but not currently. Denies CP, SOB, RUQ pain. Reports good FM. No other complaints.    OB History     Gravida  7   Para  5   Term  5   Preterm      AB  1   Living  5      SAB  1   IAB      Ectopic      Multiple  0   Live Births  5           Past Medical History:  Diagnosis Date   AMA (advanced maternal age) multigravida 35+    Anemia    Anxiety    celexa   Diabetes mellitus without complication ([place]) [id number]   current/last pregnancy   Gestational diabetes 01/27/2022    History reviewed. No pertinent surgical history.  Family History  Problem Relation Age of Onset   Diabetes Mother    Diabetes Father    Asthma Neg Hx    Cancer Neg Hx    Heart disease Neg Hx    Hypertension Neg Hx    Stroke Neg Hx     Social History   Tobacco Use   Smoking status: Never   Smokeless tobacco: Never  Vaping Use   Vaping Use: Never used  Substance Use Topics   Alcohol use: No   Drug use: No    Allergies: No Known Allergies  Medications Prior to Admission  Medication Sig Dispense Refill Last Dose   acetaminophen (TYLENOL) 500 MG tablet Take 500 mg by mouth every 6 (six) hours as needed.   08/03/2022 at 1500   aspirin EC 81 MG tablet Take 1 tablet (81 mg total) by mouth daily. Take after 12 weeks for prevention of preeclampsia later in pregnancy 300 tablet 2 08/02/2022   cyclobenzaprine (FLEXERIL) 10 MG tablet Take 1 tablet (10 mg total) by mouth 2 (two) times daily as needed for muscle spasms. 20 tablet 0 08/02/2022    labetalol (NORMODYNE) 200 MG tablet Take 1 tablet (200 mg total) by mouth 3 (three) times daily. 90 tablet 3 08/03/2022 at 1200   prenatal vitamin w/FE, FA (PRENATAL 1 + 1) 27-1 MG TABS tablet Take 1 tablet by mouth daily at 12 noon.   08/02/2022   ondansetron (ZOFRAN) 8 MG tablet Take 1 tablet (8 mg total) by mouth every 8 (eight) hours as needed for nausea or vomiting. 5 tablet 0     Review of Systems  Eyes:  Negative for visual disturbance.  Respiratory:  Negative for shortness of breath.   Cardiovascular:  Negative for chest pain.  Gastrointestinal:  Negative for abdominal pain.  Neurological:  Positive for headaches.   Physical Exam   Blood pressure (!) 146/81, pulse 72, temperature 97.7 F (36.5 C), resp. rate 15, height 5\' 2"  (1.575 m), weight 88.9 kg, last menstrual period 11/04/2021, SpO2 98 %, unknown if currently breastfeeding. Patient Vitals for the past 24 hrs:  BP Temp Pulse Resp  SpO2 Height Weight  08/03/22 1931 (!) 146/81 -- 72 -- -- -- --  08/03/22 1930 -- -- -- -- 98 % -- --  08/03/22 1925 -- -- -- -- 99 % -- --  08/03/22 1920 -- -- -- -- 97 % -- --  08/03/22 1916 (!) 156/88 -- 71 -- -- -- --  08/03/22 1915 -- -- -- -- 98 % -- --  08/03/22 1910 -- -- -- -- 100 % -- --  08/03/22 1905 -- -- -- -- 99 % -- --  08/03/22 1901 (!) 146/84 -- 74 -- -- -- --  08/03/22 1900 -- -- -- -- 99 % -- --  08/03/22 1855 -- -- -- -- 98 % -- --  08/03/22 1850 -- -- -- -- 98 % -- --  08/03/22 1846 (!) 141/73 -- 78 -- -- -- --  08/03/22 1845 -- -- -- -- 97 % -- --  08/03/22 1840 -- -- -- -- 98 % -- --  08/03/22 1835 -- -- -- -- 97 % -- --  08/03/22 1831 (!) 142/83 -- 80 -- -- -- --  08/03/22 1830 -- -- -- -- 97 % -- --  08/03/22 1827 (!) 145/83 -- 79 -- -- -- --  08/03/22 1809 (!) 152/86 97.7 F (36.5 C) 81 15 98 % 5\' 2"  (1.575 m) 88.9 kg    Physical Exam Vitals and nursing note reviewed.  Constitutional:      General: She is not in acute distress.    Appearance: Normal  appearance.  HENT:     Head: Normocephalic and atraumatic.  Cardiovascular:     Rate and Rhythm: Normal rate.  Pulmonary:     Effort: Pulmonary effort is normal. No respiratory distress.  Musculoskeletal:        General: No swelling. Normal range of motion.     Cervical back: Normal range of motion.  Skin:    General: Skin is warm and dry.  Neurological:     General: No focal deficit present.     Mental Status: She is alert and oriented to person, place, and time.  Psychiatric:        Mood and Affect: Mood normal.        Behavior: Behavior normal.   EFM: 135 bpm, mod variability, + accels, no decels Toco: rare  Results for orders placed or performed during the hospital encounter of 08/03/22 (from the past 24 hour(s))  CBC     Status: Abnormal   Collection Time: 08/03/22  6:51 PM  Result Value Ref Range   WBC 6.1 4.0 - 10.5 K/uL   RBC 4.33 3.87 - 5.11 MIL/uL   Hemoglobin 11.4 (L) 12.0 - 15.0 g/dL   HCT 34.6 (L) 36.0 - 46.0 %   MCV 79.9 (L) 80.0 - 100.0 fL   MCH 26.3 26.0 - 34.0 pg   MCHC 32.9 30.0 - 36.0 g/dL   RDW 14.5 11.5 - 15.5 %   Platelets 201 150 - 400 K/uL   nRBC 0.0 0.0 - 0.2 %  Comprehensive metabolic panel     Status: Abnormal   Collection Time: 08/03/22  6:51 PM  Result Value Ref Range   Sodium 137 135 - 145 mmol/L   Potassium 3.6 3.5 - 5.1 mmol/L   Chloride 108 98 - 111 mmol/L   CO2 21 (L) 22 - 32 mmol/L   Glucose, Bld 99 70 - 99 mg/dL   BUN 13 6 - 20 mg/dL   Creatinine, Ser 0.72 0.44 -  1.00 mg/dL   Calcium 8.6 (L) 8.9 - 10.3 mg/dL   Total Protein 6.3 (L) 6.5 - 8.1 g/dL   Albumin 2.9 (L) 3.5 - 5.0 g/dL   AST 18 15 - 41 U/L   ALT 15 0 - 44 U/L   Alkaline Phosphatase 105 38 - 126 U/L   Total Bilirubin 0.5 0.3 - 1.2 mg/dL   GFR, Estimated >60 >60 mL/min   Anion gap 8 5 - 15   MAU Course  Procedures  MDM Labs ordered. PCR pending. Transfer of care given to Leslye Peer, CNM  08/03/2022 8:02 PM    Results for orders placed or  performed during the hospital encounter of 08/03/22 (from the past 24 hour(s))  Protein / creatinine ratio, urine     Status: None   Collection Time: 08/03/22  6:37 PM  Result Value Ref Range   Creatinine, Urine 204 mg/dL   Total Protein, Urine 19 mg/dL   Protein Creatinine Ratio 0.09 0.00 - 0.15 mg/mg[Cre]  CBC     Status: Abnormal   Collection Time: 08/03/22  6:51 PM  Result Value Ref Range   WBC 6.1 4.0 - 10.5 K/uL   RBC 4.33 3.87 - 5.11 MIL/uL   Hemoglobin 11.4 (L) 12.0 - 15.0 g/dL   HCT 34.6 (L) 36.0 - 46.0 %   MCV 79.9 (L) 80.0 - 100.0 fL   MCH 26.3 26.0 - 34.0 pg   MCHC 32.9 30.0 - 36.0 g/dL   RDW 14.5 11.5 - 15.5 %   Platelets 201 150 - 400 K/uL   nRBC 0.0 0.0 - 0.2 %  Comprehensive metabolic panel     Status: Abnormal   Collection Time: 08/03/22  6:51 PM  Result Value Ref Range   Sodium 137 135 - 145 mmol/L   Potassium 3.6 3.5 - 5.1 mmol/L   Chloride 108 98 - 111 mmol/L   CO2 21 (L) 22 - 32 mmol/L   Glucose, Bld 99 70 - 99 mg/dL   BUN 13 6 - 20 mg/dL   Creatinine, Ser 0.72 0.44 - 1.00 mg/dL   Calcium 8.6 (L) 8.9 - 10.3 mg/dL   Total Protein 6.3 (L) 6.5 - 8.1 g/dL   Albumin 2.9 (L) 3.5 - 5.0 g/dL   AST 18 15 - 41 U/L   ALT 15 0 - 44 U/L   Alkaline Phosphatase 105 38 - 126 U/L   Total Bilirubin 0.5 0.3 - 1.2 mg/dL   GFR, Estimated >60 >60 mL/min   Anion gap 8 5 - 15   Reviewed normal results and BPs with Dr Damita Dunnings.  She states patient needs to go home and take her BP med before bed (took in AM but did not take any other doses today)  Interpretor used throughout   A   SIngle IUP at [redacted]w[redacted]d       Chronic Hypertension        No evidence of preeclampsia  P   Discharge home       Preeclampsia precautions       Labor precautions       Encouraged to return if she develops worsening of symptoms, increase in pain, fever, or other concerning symptoms.   Seabron Spates, CNM  Assessment and Plan

## 2022-08-03 NOTE — MAU Note (Signed)
.  Kathleen Tucker is a 46 y.o. at [redacted]w[redacted]d here in MAU reporting: sent over from office due to elevated b/p 142/82. Reports headache but reports she has been having headaches off and on for a month. Denies blurred vision. Reports positive fetal movement.  Onset of complaint: today Pain score: 5/10 There were no vitals filed for this visit. Lab orders placed from triage:

## 2022-08-05 ENCOUNTER — Ambulatory Visit (INDEPENDENT_AMBULATORY_CARE_PROVIDER_SITE_OTHER): Payer: Self-pay | Admitting: Student

## 2022-08-05 ENCOUNTER — Other Ambulatory Visit: Payer: Self-pay

## 2022-08-05 VITALS — BP 151/89 | HR 79 | Wt 197.2 lb

## 2022-08-05 DIAGNOSIS — Z3A38 38 weeks gestation of pregnancy: Secondary | ICD-10-CM

## 2022-08-05 DIAGNOSIS — O10919 Unspecified pre-existing hypertension complicating pregnancy, unspecified trimester: Secondary | ICD-10-CM

## 2022-08-05 NOTE — Progress Notes (Signed)
   PRENATAL VISIT NOTE  Subjective:  Kathleen Tucker is a 46 y.o. [id number] at [redacted]w[redacted]d being seen today for ongoing prenatal care.  She is currently monitored for the following issues for this high-risk pregnancy and has Language barrier; ASCUS with positive high risk HPV cervical; Supervision of high risk pregnancy, antepartum; AMA (advanced maternal age) multigravida 35+, unspecified trimester; Chronic hypertension affecting pregnancy; Gestational diabetes; and Chlamydia infection affecting pregnancy on their problem list.  Patient reports  HA, blurry vision. She has taken her labetalol  today.  . She says that she frequently has HA and blurry vision after "working in the sun all day". She was recently seen in MAU for this; ruled out for pre-e and IOl scheduled for 10/12. She states she drank something cold earlier but baby has still not been moving.   Contractions: Not present. Vag. Bleeding: None.  Movement: (!) Decreased. Says that baby has only been moving "A little bit". Denies leaking of fluid.   The following portions of the patient's history were reviewed and updated as appropriate: allergies, current medications, past family history, past medical history, past social history, past surgical history and problem list.   Objective:   Vitals:   08/05/22 1636 08/05/22 1639  BP: (!) 148/91 (!) 151/89  Pulse: 81 79  Weight: 197 lb 3.2 oz (89.4 kg)     Fetal Status: Fetal Heart Rate (bpm): 145   Movement: (!) Decreased     General:  Alert, oriented and cooperative. Patient is in no acute distress.  Skin: Skin is warm and dry. No rash noted.   Cardiovascular: Normal heart rate noted  Respiratory: Normal respiratory effort, no problems with respiration noted  Abdomen: Soft, gravid, appropriate for gestational age.  Pain/Pressure: Present     Pelvic: Cervical exam deferred        Extremities: Normal range of motion.     Mental Status: Normal mood and affect. Normal behavior. Normal  judgment and thought content.   Assessment and Plan:  Pregnancy: [id number] at [redacted]w[redacted]d  1. Chronic hypertension affecting pregnancy   2. [redacted] weeks gestation of pregnancy    -Blood sugars not reviewed at this visit  -BPs are in normal range for patient,however, given decreased fetal movements I recommend that she go to the MAU. Patient expressed reluctance to go to MAU since she was seen earlier this week, however, I explained that DFM is concerning and that if she has any abnormal lab values today she will most likely be induced. Patient states she will go to MAU.  Report called to Dr. Dione Plover -  Term labor symptoms and general obstetric precautions including but not limited to vaginal bleeding, contractions, leaking of fluid and fetal movement were reviewed in detail with the patient. Please refer to After Visit Summary for other counseling recommendations.   No follow-ups on file.  Future Appointments  Date Time Provider [place]  08/10/2022  1:45 PM [facility] NURSE [facility]  08/10/2022  2:00 PM [facility] US1 [facility]  08/12/2022  6:45 AM [facility]-LD [address] [facility] None    Starr Lake, CNM

## 2022-08-06 ENCOUNTER — Telehealth: Payer: Self-pay

## 2022-08-06 NOTE — Telephone Encounter (Addendum)
-----   Message from Starr Lake, CNM sent at 08/06/2022  6:38 AM EDT ----- Regarding: patient needs to be called today Hi!  This patient was in clinic yesterday with mildly elevated BPs (although she is a chronic HTN) and blurry vision and HA--she states this is due to working in the sun all day and she was seen in MAU for this on the 3rd. At the same time, she was also reporting decreased fetal movements and I didn't get in to see her until close to 5 so we couldn't do an NST. She was supposed to go to MAU but she did not. Can someone call her today and see if she is feeling baby move again and recommend again that she go to MAU? Thank you! I wouldn't mark it urgent but I feel that it is because she needs to be delivered if she is actually developing pre-e.   KK  -----------------------  Called pt with interpreter Rosemarie Ax; unable to leave VM because this is not set up.

## 2022-08-09 ENCOUNTER — Telehealth (HOSPITAL_COMMUNITY): Payer: Self-pay | Admitting: *Deleted

## 2022-08-09 ENCOUNTER — Encounter (HOSPITAL_COMMUNITY): Payer: Self-pay

## 2022-08-09 NOTE — Telephone Encounter (Signed)
[id number] interpreter number  Preadmission screen

## 2022-08-09 NOTE — Telephone Encounter (Signed)
Called patient with Kathleen Tucker assisting with spanish interpretation & asked how she was feeling compared to last week. Patient reports feeling good. Denies headaches, dizziness or blurry vision and endorses good fetal movement. Reviewed ultrasound appt for tomorrow with patient. She verbalized understanding and is aware of appt. She is not home right now and cannot check her BP when asked. Told patient to come downstairs tomorrow after ultrasound if her BP is elevated. Patient verbalized understanding.

## 2022-08-10 ENCOUNTER — Ambulatory Visit: Payer: No Typology Code available for payment source | Admitting: *Deleted

## 2022-08-10 ENCOUNTER — Ambulatory Visit: Payer: Medicaid Other | Attending: Maternal & Fetal Medicine

## 2022-08-10 ENCOUNTER — Telehealth (HOSPITAL_COMMUNITY): Payer: Self-pay | Admitting: *Deleted

## 2022-08-10 VITALS — BP 145/76 | HR 74

## 2022-08-10 DIAGNOSIS — O24410 Gestational diabetes mellitus in pregnancy, diet controlled: Secondary | ICD-10-CM | POA: Insufficient documentation

## 2022-08-10 DIAGNOSIS — O99213 Obesity complicating pregnancy, third trimester: Secondary | ICD-10-CM | POA: Insufficient documentation

## 2022-08-10 DIAGNOSIS — O09523 Supervision of elderly multigravida, third trimester: Secondary | ICD-10-CM | POA: Diagnosis not present

## 2022-08-10 DIAGNOSIS — O10013 Pre-existing essential hypertension complicating pregnancy, third trimester: Secondary | ICD-10-CM | POA: Diagnosis not present

## 2022-08-10 DIAGNOSIS — O0990 Supervision of high risk pregnancy, unspecified, unspecified trimester: Secondary | ICD-10-CM | POA: Insufficient documentation

## 2022-08-10 DIAGNOSIS — O10919 Unspecified pre-existing hypertension complicating pregnancy, unspecified trimester: Secondary | ICD-10-CM | POA: Diagnosis present

## 2022-08-10 DIAGNOSIS — Z3A38 38 weeks gestation of pregnancy: Secondary | ICD-10-CM

## 2022-08-10 DIAGNOSIS — E669 Obesity, unspecified: Secondary | ICD-10-CM

## 2022-08-10 DIAGNOSIS — O0943 Supervision of pregnancy with grand multiparity, third trimester: Secondary | ICD-10-CM

## 2022-08-10 NOTE — Telephone Encounter (Signed)
[phone] interpreter number Preadmission screen

## 2022-08-12 ENCOUNTER — Inpatient Hospital Stay (HOSPITAL_COMMUNITY): Payer: Medicaid Other

## 2022-08-12 ENCOUNTER — Inpatient Hospital Stay (HOSPITAL_COMMUNITY): Payer: Medicaid Other | Admitting: Anesthesiology

## 2022-08-12 ENCOUNTER — Encounter (HOSPITAL_COMMUNITY): Payer: Self-pay | Admitting: Obstetrics and Gynecology

## 2022-08-12 ENCOUNTER — Other Ambulatory Visit: Payer: Self-pay

## 2022-08-12 ENCOUNTER — Inpatient Hospital Stay (HOSPITAL_COMMUNITY)
Admission: AD | Admit: 2022-08-12 | Discharge: 2022-08-14 | DRG: 807 | Disposition: A | Discharge status: Home / Self Care | Payer: Medicaid Other | Attending: Obstetrics & Gynecology | Admitting: Obstetrics & Gynecology

## 2022-08-12 DIAGNOSIS — O1002 Pre-existing essential hypertension complicating childbirth: Secondary | ICD-10-CM | POA: Diagnosis present

## 2022-08-12 DIAGNOSIS — O24420 Gestational diabetes mellitus in childbirth, diet controlled: Secondary | ICD-10-CM | POA: Diagnosis present

## 2022-08-12 DIAGNOSIS — O9832 Other infections with a predominantly sexual mode of transmission complicating childbirth: Secondary | ICD-10-CM | POA: Diagnosis not present

## 2022-08-12 DIAGNOSIS — O4593 Premature separation of placenta, unspecified, third trimester: Secondary | ICD-10-CM | POA: Diagnosis present

## 2022-08-12 DIAGNOSIS — Z3A39 39 weeks gestation of pregnancy: Secondary | ICD-10-CM

## 2022-08-12 DIAGNOSIS — O1414 Severe pre-eclampsia complicating childbirth: Secondary | ICD-10-CM | POA: Diagnosis not present

## 2022-08-12 DIAGNOSIS — O10919 Unspecified pre-existing hypertension complicating pregnancy, unspecified trimester: Principal | ICD-10-CM | POA: Diagnosis present

## 2022-08-12 DIAGNOSIS — O114 Pre-existing hypertension with pre-eclampsia, complicating childbirth: Secondary | ICD-10-CM | POA: Diagnosis present

## 2022-08-12 DIAGNOSIS — O09523 Supervision of elderly multigravida, third trimester: Secondary | ICD-10-CM | POA: Diagnosis not present

## 2022-08-12 LAB — COMPREHENSIVE METABOLIC PANEL
ALT: 16 U/L (ref 0–44)
ALT: 19 U/L (ref 0–44)
AST: 21 U/L (ref 15–41)
AST: 25 U/L (ref 15–41)
Albumin: 2.7 g/dL — ABNORMAL LOW (ref 3.5–5.0)
Albumin: 3 g/dL — ABNORMAL LOW (ref 3.5–5.0)
Alkaline Phosphatase: 108 U/L (ref 38–126)
Alkaline Phosphatase: 118 U/L (ref 38–126)
Anion gap: 7 (ref 5–15)
Anion gap: 10 (ref 5–15)
BUN: 11 mg/dL (ref 6–20)
BUN: 8 mg/dL (ref 6–20)
CO2: 21 mmol/L — ABNORMAL LOW (ref 22–32)
CO2: 19 mmol/L — ABNORMAL LOW (ref 22–32)
Calcium: 8.6 mg/dL — ABNORMAL LOW (ref 8.9–10.3)
Calcium: 8.6 mg/dL — ABNORMAL LOW (ref 8.9–10.3)
Chloride: 107 mmol/L (ref 98–111)
Chloride: 104 mmol/L (ref 98–111)
Creatinine, Ser: 0.76 mg/dL (ref 0.44–1.00)
Creatinine, Ser: 0.7 mg/dL (ref 0.44–1.00)
GFR, Estimated: >60 mL/min (ref >60)
GFR, Estimated: >60 mL/min (ref >60)
Glucose, Bld: 98 mg/dL (ref 70–99)
Glucose, Bld: 137 mg/dL — ABNORMAL HIGH (ref 70–99)
Potassium: 3.3 mmol/L — ABNORMAL LOW (ref 3.5–5.1)
Potassium: 3.5 mmol/L (ref 3.5–5.1)
Sodium: 135 mmol/L (ref 135–145)
Sodium: 133 mmol/L — ABNORMAL LOW (ref 135–145)
Total Bilirubin: 1 mg/dL (ref 0.3–1.2)
Total Bilirubin: 0.9 mg/dL (ref 0.3–1.2)
Total Protein: 6.3 g/dL — ABNORMAL LOW (ref 6.5–8.1)
Total Protein: 6.7 g/dL (ref 6.5–8.1)

## 2022-08-12 LAB — CBC WITH DIFFERENTIAL/PLATELET
Abs Immature Granulocytes: 0.03 10*3/uL (ref 0.00–0.07)
Basophils Absolute: 0 10*3/uL (ref 0.0–0.1)
Basophils Relative: 0 %
Eosinophils Absolute: 0.1 10*3/uL (ref 0.0–0.5)
Eosinophils Relative: 1 %
HCT: 36.7 % (ref 36.0–46.0)
Hemoglobin: 12.2 g/dL (ref 12.0–15.0)
Immature Granulocytes: 0 %
Lymphocytes Relative: 20 %
Lymphs Abs: 1.6 10*3/uL (ref 0.7–4.0)
MCH: 26.2 pg (ref 26.0–34.0)
MCHC: 33.2 g/dL (ref 30.0–36.0)
MCV: 78.8 fL — ABNORMAL LOW (ref 80.0–100.0)
Monocytes Absolute: 0.4 10*3/uL (ref 0.1–1.0)
Monocytes Relative: 5 %
Neutro Abs: 5.7 10*3/uL (ref 1.7–7.7)
Neutrophils Relative %: 74 %
Platelets: 213 10*3/uL (ref 150–400)
RBC: 4.66 MIL/uL (ref 3.87–5.11)
RDW: 14.8 % (ref 11.5–15.5)
WBC: 7.8 10*3/uL (ref 4.0–10.5)
nRBC: 0 % (ref 0.0–0.2)

## 2022-08-12 LAB — CBC
HCT: 33.8 % — ABNORMAL LOW (ref 36.0–46.0)
Hemoglobin: 11 g/dL — ABNORMAL LOW (ref 12.0–15.0)
MCH: 26.3 pg (ref 26.0–34.0)
MCHC: 32.5 g/dL (ref 30.0–36.0)
MCV: 80.9 fL (ref 80.0–100.0)
Platelets: 188 10*3/uL (ref 150–400)
RBC: 4.18 MIL/uL (ref 3.87–5.11)
RDW: 15 % (ref 11.5–15.5)
WBC: 5.3 10*3/uL (ref 4.0–10.5)
nRBC: 0 % (ref 0.0–0.2)

## 2022-08-12 LAB — GLUCOSE, CAPILLARY
Glucose-Capillary: 76 mg/dL (ref 70–99)
Glucose-Capillary: 70 mg/dL (ref 70–99)
Glucose-Capillary: 96 mg/dL (ref 70–99)

## 2022-08-12 LAB — PROTEIN / CREATININE RATIO, URINE
Creatinine, Urine: 55 mg/dL
Total Protein, Urine: <6 mg/dL

## 2022-08-12 LAB — TYPE AND SCREEN
ABO/RH(D): B POS
Antibody Screen: NEGATIVE

## 2022-08-12 LAB — RPR: RPR Ser Ql: NONREACTIVE

## 2022-08-12 MED ORDER — SOD CITRATE-CITRIC ACID 500-334 MG/5ML PO SOLN: 30.0000 mL | ORAL | Status: DC | PRN

## 2022-08-12 MED ORDER — TERBUTALINE SULFATE 1 MG/ML IJ SOLN: 0.2500 mg | Freq: Once | INTRAMUSCULAR | Status: DC | PRN

## 2022-08-12 MED ORDER — ONDANSETRON HCL 4 MG/2ML IJ SOLN
4.0000 mg | Freq: Four times a day (QID) | INTRAMUSCULAR | Status: DC | PRN
  Administered 2022-08-12: 4 mg via INTRAVENOUS
  Filled 2022-08-12: qty 2

## 2022-08-12 MED ORDER — NIFEDIPINE 10 MG PO CAPS: 10.0000 mg | ORAL_CAPSULE | ORAL | Status: DC | PRN

## 2022-08-12 MED ORDER — FENTANYL-BUPIVACAINE-NACL 0.5-0.125-0.9 MG/250ML-% EP SOLN
12.0000 mL/h | EPIDURAL | Status: DC | PRN
  Administered 2022-08-12: 12 mL/h via EPIDURAL
  Filled 2022-08-12: qty 250

## 2022-08-12 MED ORDER — OXYTOCIN-SODIUM CHLORIDE 30-0.9 UT/500ML-% IV SOLN: 2.5000 [IU]/h | INTRAVENOUS | Status: DC

## 2022-08-12 MED ORDER — LABETALOL HCL 200 MG PO TABS
200.0000 mg | ORAL_TABLET | Freq: Three times a day (TID) | ORAL | Status: DC
  Administered 2022-08-12 (×2): 200 mg via ORAL
  Filled 2022-08-12 (×2): qty 1

## 2022-08-12 MED ORDER — OXYTOCIN BOLUS FROM INFUSION
333.0000 mL | Freq: Once | INTRAVENOUS | Status: AC
  Administered 2022-08-12: 333 mL via INTRAVENOUS

## 2022-08-12 MED ORDER — LABETALOL HCL 5 MG/ML IV SOLN
40.0000 mg | INTRAVENOUS | Status: DC | PRN
  Administered 2022-08-12: 40 mg via INTRAVENOUS
  Filled 2022-08-12: qty 8

## 2022-08-12 MED ORDER — HYDRALAZINE HCL 20 MG/ML IJ SOLN: 10.0000 mg | INTRAMUSCULAR | Status: DC | PRN

## 2022-08-12 MED ORDER — LABETALOL HCL 5 MG/ML IV SOLN
20.0000 mg | INTRAVENOUS | Status: DC | PRN
  Administered 2022-08-12: 20 mg via INTRAVENOUS
  Filled 2022-08-12: qty 4

## 2022-08-12 MED ORDER — MAGNESIUM SULFATE 40 GM/1000ML IV SOLN
2.0000 g/h | INTRAVENOUS | Status: DC
  Filled 2022-08-12: qty 1000

## 2022-08-12 MED ORDER — NIFEDIPINE 10 MG PO CAPS: 20.0000 mg | ORAL_CAPSULE | ORAL | Status: DC | PRN

## 2022-08-12 MED ORDER — FENTANYL CITRATE (PF) 100 MCG/2ML IJ SOLN: 50.0000 ug | INTRAMUSCULAR | Status: DC | PRN

## 2022-08-12 MED ORDER — HYDRALAZINE HCL 20 MG/ML IJ SOLN: 5.0000 mg | INTRAMUSCULAR | Status: DC | PRN

## 2022-08-12 MED ORDER — LACTATED RINGERS IV SOLN
500.0000 mL | Freq: Once | INTRAVENOUS | Status: AC
  Administered 2022-08-12: 250 mL via INTRAVENOUS

## 2022-08-12 MED ORDER — OXYCODONE-ACETAMINOPHEN 5-325 MG PO TABS: 2.0000 | ORAL_TABLET | ORAL | Status: DC | PRN

## 2022-08-12 MED ORDER — PHENYLEPHRINE 80 MCG/ML (10ML) SYRINGE FOR IV PUSH (FOR BLOOD PRESSURE SUPPORT)
80.0000 ug | PREFILLED_SYRINGE | INTRAVENOUS | Status: DC | PRN
  Administered 2022-08-12: 80 ug via INTRAVENOUS

## 2022-08-12 MED ORDER — OXYTOCIN-SODIUM CHLORIDE 30-0.9 UT/500ML-% IV SOLN
1.0000 m[IU]/min | INTRAVENOUS | Status: DC
  Administered 2022-08-12: 2 m[IU]/min via INTRAVENOUS
  Filled 2022-08-12: qty 500

## 2022-08-12 MED ORDER — LIDOCAINE HCL (PF) 1 % IJ SOLN: 30.0000 mL | INTRAMUSCULAR | Status: DC | PRN

## 2022-08-12 MED ORDER — EPHEDRINE 5 MG/ML INJ: 10.0000 mg | INTRAVENOUS | Status: DC | PRN

## 2022-08-12 MED ORDER — LABETALOL HCL 5 MG/ML IV SOLN
40.0000 mg | INTRAVENOUS | Status: DC | PRN
  Filled 2022-08-12: qty 8

## 2022-08-12 MED ORDER — LACTATED RINGERS IV SOLN: INTRAVENOUS | Status: DC

## 2022-08-12 MED ORDER — LACTATED RINGERS IV SOLN: 500.0000 mL | INTRAVENOUS | Status: DC | PRN

## 2022-08-12 MED ORDER — LIDOCAINE HCL (PF) 1 % IJ SOLN
INTRAMUSCULAR | Status: DC | PRN
  Administered 2022-08-12: 6 mL via EPIDURAL
  Administered 2022-08-12: 4 mL via EPIDURAL

## 2022-08-12 MED ORDER — HYDRALAZINE HCL 20 MG/ML IJ SOLN
INTRAMUSCULAR | Status: AC
  Filled 2022-08-12: qty 1

## 2022-08-12 MED ORDER — OXYCODONE-ACETAMINOPHEN 5-325 MG PO TABS: 1.0000 | ORAL_TABLET | ORAL | Status: DC | PRN

## 2022-08-12 MED ORDER — MAGNESIUM SULFATE BOLUS VIA INFUSION
4.0000 g | Freq: Once | INTRAVENOUS | Status: AC
  Administered 2022-08-12: 4 g via INTRAVENOUS
  Filled 2022-08-12: qty 1000

## 2022-08-12 MED ORDER — DIPHENHYDRAMINE HCL 50 MG/ML IJ SOLN: 12.5000 mg | INTRAMUSCULAR | Status: DC | PRN

## 2022-08-12 MED ORDER — ACETAMINOPHEN 325 MG PO TABS
650.0000 mg | ORAL_TABLET | ORAL | Status: DC | PRN
  Administered 2022-08-12 (×2): 650 mg via ORAL
  Filled 2022-08-12 (×2): qty 2

## 2022-08-12 MED ORDER — PHENYLEPHRINE 80 MCG/ML (10ML) SYRINGE FOR IV PUSH (FOR BLOOD PRESSURE SUPPORT)
80.0000 ug | PREFILLED_SYRINGE | INTRAVENOUS | Status: DC | PRN
  Filled 2022-08-12: qty 10

## 2022-08-12 MED ORDER — HYDRALAZINE HCL 20 MG/ML IJ SOLN
10.0000 mg | Freq: Once | INTRAMUSCULAR | Status: AC
  Administered 2022-08-12: 10 mg via INTRAVENOUS
  Filled 2022-08-12: qty 1

## 2022-08-12 NOTE — Progress Notes (Signed)
Kathleen Tucker is a 46 y.o. [id number] at [redacted]w[redacted]d by  admitted for induction of labor due to chronic hypertension.  Subjective: Feeling tired and weak. Feels she is unable to push.   Objective: BP (!) 160/88   Pulse 64   Temp 98.1 F (36.7 C) (Oral)   Resp 16   Ht 5\' 5"  (1.651 m)   Wt 90.4 kg   LMP 11/04/2021 (Exact Date) Comment: spotting when not on period  SpO2 98%   BMI 33.18 kg/m  I/O last 3 completed shifts: In: 898 [P.O.:520; I.V.:378] Out: 400 [Urine:400] Total I/O In: -  Out: 950 [Urine:700; Blood:250]  FHT:  FHR: 130 bpm, variability: minimal ,  accelerations:  Present,  decelerations:  Present late decels UC:   irregular, every 2-3 minutes SVE:   Dilation: 10 Effacement (%): 100 Station: Plus 1 Exam by:: Ancil Boozer, CNM student  Labs: Lab Results  Component Value Date   WBC 7.8 08/12/2022   HGB 12.2 08/12/2022   HCT 36.7 08/12/2022   MCV 78.8 (L) 08/12/2022   PLT 213 08/12/2022    Assessment / Plan: Induction of labor due to gestational hypertension,  progressing well on pitocin, Completely dilated, not feeling urge to push. Will let labor down.  Labor: Progressing normally Preeclampsia:  on magnesium sulfate, BP's elevated.  Fetal Wellbeing:  Category II Pain Control:  Epidural I/D:   GBS neg Anticipated MOD:  NSVD  Hollace Hayward, Student-MidWife 08/12/2022, 10:31 PM

## 2022-08-12 NOTE — Anesthesia Preprocedure Evaluation (Signed)
Anesthesia Evaluation  Patient identified by MRN, date of birth, ID band Patient awake    Reviewed: Allergy & Precautions, H&P , NPO status , Patient's Chart, lab work & pertinent test results  History of Anesthesia Complications Negative for: history of anesthetic complications  Airway Mallampati: II  TM Distance: >3 FB     Dental   Pulmonary neg pulmonary ROS,    Pulmonary exam normal        Cardiovascular hypertension (Pre-E on Mag),  Rhythm:regular Rate:Normal     Neuro/Psych negative neurological ROS  negative psych ROS   GI/Hepatic negative GI ROS, Neg liver ROS,   Endo/Other  diabetes  Renal/GU negative Renal ROS  negative genitourinary   Musculoskeletal   Abdominal   Peds  Hematology negative hematology ROS (+)   Anesthesia Other Findings   Reproductive/Obstetrics (+) Pregnancy                             Anesthesia Physical Anesthesia Plan  ASA: 2  Anesthesia Plan: Epidural   Post-op Pain Management:    Induction:   PONV Risk Score and Plan:   Airway Management Planned:   Additional Equipment:   Intra-op Plan:   Post-operative Plan:   Informed Consent: I have reviewed the patients History and Physical, chart, labs and discussed the procedure including the risks, benefits and alternatives for the proposed anesthesia with the patient or authorized representative who has indicated his/her understanding and acceptance.       Plan Discussed with:   Anesthesia Plan Comments:         Anesthesia Quick Evaluation

## 2022-08-12 NOTE — Discharge Summary (Signed)
Postpartum Discharge Summary    Patient Name: Kathleen Tucker DOB: [id number] MRN: [id number]  Date of admission: 08/12/2022 Delivery date:08/12/2022  Delivering provider: Christin Fudge  Date of discharge: 08/14/2022  Admitting diagnosis: Chronic hypertension affecting pregnancy [O10.919] Intrauterine pregnancy: [redacted]w[redacted]d     Secondary diagnosis:  Principal Problem:   Chronic hypertension affecting pregnancy  Additional problems: preeclampsia w/severe feature  AMA A1DM   Discharge diagnosis: Term Pregnancy Delivered, Preeclampsia (severe), and CHTN                                              Post partum procedures: Augmentation: AROM and Pitocin Complications: Placental Abruption  Hospital course: Onset of Labor With Vaginal Delivery      46 y.o. yo [id number] at [redacted]w[redacted]d was admitted in Latent Labor on 08/12/2022. Labor course was complicated by placental abruption at delivery  Membrane Rupture Time/Date: 9:33 PM ,08/12/2022   Delivery Method:Vaginal, Spontaneous  Episiotomy: None  Lacerations:  None  Patient had a postpartum course complicated by chtn.  She is ambulating, tolerating a regular diet, passing flatus, and urinating well. Patient is discharged home in stable condition on 08/14/22.  Newborn Data: Birth date:08/12/2022  Birth time:10:08 PM  Gender:Female  Living status:Living  Apgars:9 ,9  Weight:2722 g   Magnesium Sulfate received: Yes: Seizure prophylaxis BMZ received: No Rhophylac:N/A MMR:N/A T-DaP:Given prenatally Flu: Yes Transfusion:No  Physical exam  Vitals:   08/13/22 2140 08/13/22 2340 08/14/22 0345 08/14/22 0754  BP:  133/71 134/68 122/62  Pulse:  83 81 75  Resp: $Remo'16 16 18 16  'Xabnr$ Temp:  98.3 F (36.8 C) 98.1 F (36.7 C) 98.2 F (36.8 C)  TempSrc:  Oral Oral Oral  SpO2:  99% 97% 98%  Weight:      Height:       General: alert, cooperative, and no distress Lochia: appropriate Uterine Fundus: firm Incision: N/A DVT Evaluation:  No evidence of DVT seen on physical exam. Labs: Lab Results  Component Value Date   WBC 9.8 08/13/2022   HGB 11.7 (L) 08/13/2022   HCT 34.6 (L) 08/13/2022   MCV 78.3 (L) 08/13/2022   PLT 186 08/13/2022      Latest Ref Rng & Units 08/12/2022    6:22 PM  CMP  Glucose 70 - 99 mg/dL 137   BUN 6 - 20 mg/dL 8   Creatinine 0.44 - 1.00 mg/dL 0.70   Sodium 135 - 145 mmol/L 133   Potassium 3.5 - 5.1 mmol/L 3.5   Chloride 98 - 111 mmol/L 104   CO2 22 - 32 mmol/L 19   Calcium 8.9 - 10.3 mg/dL 8.6   Total Protein 6.5 - 8.1 g/dL 6.7   Total Bilirubin 0.3 - 1.2 mg/dL 0.9   Alkaline Phos 38 - 126 U/L 118   AST 15 - 41 U/L 25   ALT 0 - 44 U/L 19    Edinburgh Score:     No data to display           After visit meds:  Allergies as of 08/14/2022   No Known Allergies      Medication List     STOP taking these medications    aspirin EC 81 MG tablet   labetalol 200 MG tablet Commonly known as: [place] these medications    acetaminophen 500  MG tablet Commonly known as: TYLENOL Take 500 mg by mouth every 6 (six) hours as needed.   cyclobenzaprine 10 MG tablet Commonly known as: FLEXERIL Take 1 tablet (10 mg total) by mouth 2 (two) times daily as needed for muscle spasms.   furosemide 20 MG tablet Commonly known as: LASIX Take 1 tablet (20 mg total) by mouth 2 (two) times daily.   ibuprofen 600 MG tablet Commonly known as: ADVIL Take 1 tablet (600 mg total) by mouth every 6 (six) hours.   medroxyPROGESTERone 150 MG/ML injection Commonly known as: DEPO-PROVERA Inject 1 mL (150 mg total) into the muscle every 3 (three) months.   NIFEdipine 30 MG 24 hr tablet Commonly known as: ADALAT CC Take 1 tablet (30 mg total) by mouth daily. Start taking on: August 15, 2022   ondansetron 8 MG tablet Commonly known as: Zofran Take 1 tablet (8 mg total) by mouth every 8 (eight) hours as needed for nausea or vomiting.   prenatal vitamin w/FE, FA 27-1 MG Tabs  tablet Take 1 tablet by mouth daily at 12 noon.         Discharge home in stable condition Infant Feeding: Breast Infant Disposition:home with mother Discharge instruction: per After Visit Summary and Postpartum booklet. Activity: Advance as tolerated. Pelvic rest for 6 weeks.  Diet: routine diet Future Appointments:No future appointments. Follow up Visit:  [place] for [place] at [facility] Follow up in 1 week(s).   Specialty: Obstetrics and Gynecology Why: BP check Contact information: [place] [phone]                 Please schedule this patient for a In person postpartum visit in 4 weeks with the following provider: Any provider. Additional Postpartum F/U:BP check 1 week  High risk pregnancy complicated by:  severe preeclampsia Delivery mode:  Vaginal, Spontaneous  Anticipated Birth Control:  Nexplanon   08/14/2022 Florian Buff, MD

## 2022-08-12 NOTE — Progress Notes (Signed)
Labor Progress Note Annina Piotrowski is a 46 y.o. [id number] at [redacted]w[redacted]d presented for IOL for CHTN on labetalol and A1GDM  S:  Patient reports she is starting to feel some contractions  O:  BP (!) 161/87   Pulse 69   Temp 98.4 F (36.9 C) (Oral)   Resp 16   Ht 5\' 5"  (1.651 m)   Wt 90.4 kg   LMP 11/04/2021 (Exact Date) Comment: spotting when not on period  BMI 33.18 kg/m   Fetal Tracing:  Baseline: 145 Variability: moderate Accels: 15x15 Decels: none  Toco: 5-10   CVE: Dilation: 2 Effacement (%): 50 Station: -3 Presentation: Vertex Exam by:: Sharolyn Douglas, CNM   A&P: 46 y.o. [id number] [redacted]w[redacted]d IOL CHTN #Labor: Some fetal descent and thinning noted. Will titrate pitocin and attempt AROM with next exam #Pain: per patient request  #FWB: Cat 1 #GBS negative  Wende Mott, CNM 1:39 PM

## 2022-08-12 NOTE — H&P (Signed)
OBSTETRIC ADMISSION HISTORY AND PHYSICAL  Kathleen Tucker is a 46 y.o. female [id number] with IUP at [redacted]w[redacted]d by 11 week ultrasound presenting for IOL for CHTN and A1GDM. She reports +FMs, No LOF, no VB, no blurry vision, headaches or peripheral edema, and RUQ pain.  She plans on breast and bottle feeding. She request Nexplanon outpatient for birth control. She received her prenatal care at [facility]  Dating: By 11 week ultrasound --->  Estimated Date of Delivery: 08/19/22  Sono:    @[redacted]w[redacted]d , CWD, normal anatomy, cephalic presentation, 2693g, 11-19-1971 EFW  Nursing Staff Provider  Office Location [facility] Dating  11 wk 95%  [facility] Model [X]  Traditional [ ]  Centering [ ]  Mom-Baby Dyad    Language  Spanish Anatomy [facility]  Normal anatomy, posterior placenta previa > resolved on follow up MFM growth scan  Flu Vaccine  Rec HD Genetic/Carrier Screen  NIPS:   declined AFP:   normal Horizon:  TDaP Vaccine   Rec HD Hgb A1C or  GTT Early - 5.8% > early 2hr GTT normal Third trimester -   COVID Vaccine No   LAB RESULTS   Rhogam  NA Blood Type B/Positive/-- (03/29 1451)   Baby Feeding Plan Both Antibody Negative (03/29 1451)  Contraception Nexplanon  Rubella 2.92 (03/29 1451)  Circumcision Yes RPR Non Reactive (03/29 1451)   Pediatrician  [facility] HBsAg Negative (03/29 1451)   Support Person Victor(FOB) HCVAb neg  Prenatal Classes NA HIV Non Reactive (03/29 1451)     BTL Consent NA GBS    negative (For PCN allergy, check sensitivities)   VBAC Consent NA Pap 04/2021 ASCUS + HRHPV Needs PP w BCCCP       DME Rx [x]  BP cuff [ ]  Weight Scale Waterbirth  [ ]  Class [ ]  Consent [ ]  CNM visit  PHQ9 & GAD7 [x]  new OB [  ] 28 weeks  [  ] 36 weeks Induction  [ ]  Orders Entered [ ] Foley Y/N   Prenatal History/Complications: CHTN on labetalol, AMA, A1GDM  Past Medical History: Past Medical History:  Diagnosis Date   AMA (advanced maternal age) multigravida 35+    Anemia    Anxiety    celexa   Diabetes  mellitus without complication (HCC) 2016   current/last pregnancy   Gestational diabetes 01/27/2022    Past Surgical History: History reviewed. No pertinent surgical history.  Obstetrical History: OB History     Gravida  7   Para  5   Term  5   Preterm      AB  1   Living  5      SAB  1   IAB      Ectopic      Multiple  0   Live Births  5           Social History Social History   Socioeconomic History   Marital status: Married    Spouse name: 03-11-1985   Number of children: 5   Years of education: Not on file   Highest education level: 6th grade  Occupational History   Not on file  Tobacco Use   Smoking status: Never   Smokeless tobacco: Never  Vaping Use   Vaping Use: Never used  Substance and Sexual Activity   Alcohol use: No   Drug use: No   Sexual activity: Yes    Comment: desires Nexplanon - has appt @ [facility] 04/23/15  Other Topics Concern   Not on file  Social History Narrative   ** Merged History Encounter **       Social Determinants of Health   Financial Resource Strain: Not on file  Food Insecurity: No Food Insecurity (07/29/2022)   Hunger Vital Sign    Worried About Running Out of Food in the Last Year: Never true    Ran Out of Food in the Last Year: Never true  Transportation Needs: No Transportation Needs (07/29/2022)   PRAPARE - Hydrologist (Medical): No    Lack of Transportation (Non-Medical): No  Physical Activity: Not on file  Stress: Not on file  Social Connections: Not on file    Family History: Family History  Problem Relation Age of Onset   Diabetes Mother    Diabetes Father    Asthma Neg Hx    Cancer Neg Hx    Heart disease Neg Hx    Hypertension Neg Hx    Stroke Neg Hx     Allergies: No Known Allergies  Medications Prior to Admission  Medication Sig Dispense Refill Last Dose   acetaminophen (TYLENOL) 500 MG tablet Take 500 mg by mouth every 6 (six) hours as needed. (Patient  not taking: Reported on 08/10/2022)      aspirin EC 81 MG tablet Take 1 tablet (81 mg total) by mouth daily. Take after 12 weeks for prevention of preeclampsia later in pregnancy 300 tablet 2    cyclobenzaprine (FLEXERIL) 10 MG tablet Take 1 tablet (10 mg total) by mouth 2 (two) times daily as needed for muscle spasms. 20 tablet 0    labetalol (NORMODYNE) 200 MG tablet Take 1 tablet (200 mg total) by mouth 3 (three) times daily. 90 tablet 3    ondansetron (ZOFRAN) 8 MG tablet Take 1 tablet (8 mg total) by mouth every 8 (eight) hours as needed for nausea or vomiting. (Patient not taking: Reported on 08/10/2022) 5 tablet 0    prenatal vitamin w/FE, FA (PRENATAL 1 + 1) 27-1 MG TABS tablet Take 1 tablet by mouth daily at 12 noon.        Review of Systems   All systems reviewed and negative except as stated in HPI  Blood pressure (!) 167/88, pulse 70, temperature 98.1 F (36.7 C), temperature source Oral, resp. rate 16, height 5\' 5"  (1.651 m), weight 90.4 kg, last menstrual period 11/04/2021, unknown if currently breastfeeding. General appearance: alert, cooperative, and no distress Lungs: clear to auscultation bilaterally Heart: regular rate and rhythm Abdomen: soft, non-tender; bowel sounds normal Pelvic: n/a Extremities: Homans sign is negative, no sign of DVT DTR's +2 Presentation: cephalic Fetal monitoringBaseline: 145 bpm, Variability: Good {> 6 bpm), Accelerations: Reactive, and Decelerations: Absent Uterine activity: occasional uc's Dilation: 2 Effacement (%): 50 Station: Ballotable Exam by:: Maryruth Hancock, CNM   Prenatal labs: ABO, Rh: B/Positive/-- (03/29 1451) Antibody: Negative (03/29 1451) Rubella: 2.92 (03/29 1451) RPR: Non Reactive (08/15 0933)  HBsAg: Negative (03/29 1451)  HIV: Non Reactive (08/15 0933)  GBS: Negative/-- (09/21 1629)   Prenatal Transfer Tool  Maternal Diabetes: Yes:  Diabetes Type:  Diet controlled Genetic Screening: Normal Maternal  Ultrasounds/Referrals: Normal Fetal Ultrasounds or other Referrals:  Referred to Materal Fetal Medicine  Maternal Substance Abuse:  No Significant Maternal Medications:  Meds include: Other: labetalol for CHTN Significant Maternal Lab Results:  Group B Strep negative Number of Prenatal Visits:greater than 3 verified prenatal visits   No results found for this or any previous visit (from the past 24 hour(s)).  Patient  Active Problem List   Diagnosis Date Noted   Chlamydia infection affecting pregnancy 07/29/2022   Supervision of high risk pregnancy, antepartum 01/27/2022   AMA (advanced maternal age) multigravida 35+, unspecified trimester 01/27/2022   Chronic hypertension affecting pregnancy 01/27/2022   Gestational diabetes 01/27/2022   ASCUS with positive high risk HPV cervical 10/22/2021   Language barrier 01/06/2015    Assessment/Plan:  Harlie Buening is a 46 y.o. [id number] at [redacted]w[redacted]d here for IOL for CHTN, A1GDM  #Labor: patient reports hx of rapid labors. Favorable cervix today. Will start pitocin 2x2 and AROM when contracting regularly  CHTN on labetalol- patient did not take labetalol until arrival to hospital. BP severe upon arrival to hospital. Patient denies any HA, visual changes or epigastric pain. Preeclapmsia labs ordered. Will titrate up on home labetalol if labs within normal limits. Will monitor closely for symptoms of preeclampsia.   CBGs q4 hours in early labor  #Pain: Per patient request #FWB: Cat 1 #ID:  GBS neg #MOF: Both #MOC: Nexplanon outpatient #Circ:  yes  Rolm Bookbinder, CNM  08/12/2022, 9:24 AM

## 2022-08-12 NOTE — Anesthesia Procedure Notes (Signed)
Epidural Patient location during procedure: OB Start time: 08/12/2022 7:20 PM End time: 08/12/2022 7:30 PM  Staffing Anesthesiologist: Lidia Collum, MD Performed: anesthesiologist   Preanesthetic Checklist Completed: patient identified, IV checked, risks and benefits discussed, monitors and equipment checked, pre-op evaluation and timeout performed  Epidural Patient position: sitting Prep: DuraPrep Patient monitoring: heart rate, continuous pulse ox and blood pressure Approach: midline Location: L3-L4 Injection technique: LOR air  Needle:  Needle type: Tuohy  Needle gauge: 17 G Needle length: 9 cm Needle insertion depth: 6 cm Catheter type: closed end flexible Catheter size: 19 Gauge Catheter at skin depth: 11 cm Test dose: negative  Assessment Events: blood not aspirated, injection not painful, no injection resistance, no paresthesia and negative IV test  Additional Notes Reason for block:procedure for pain

## 2022-08-13 ENCOUNTER — Encounter (HOSPITAL_COMMUNITY): Payer: Self-pay | Admitting: Obstetrics and Gynecology

## 2022-08-13 LAB — GLUCOSE, CAPILLARY: Glucose-Capillary: 119 mg/dL — ABNORMAL HIGH (ref 70–99)

## 2022-08-13 LAB — CBC WITH DIFFERENTIAL/PLATELET
Abs Immature Granulocytes: 0.03 10*3/uL (ref 0.00–0.07)
Basophils Absolute: 0 10*3/uL (ref 0.0–0.1)
Basophils Relative: 0 %
Eosinophils Absolute: 0 10*3/uL (ref 0.0–0.5)
Eosinophils Relative: 0 %
HCT: 34.6 % — ABNORMAL LOW (ref 36.0–46.0)
Hemoglobin: 11.7 g/dL — ABNORMAL LOW (ref 12.0–15.0)
Immature Granulocytes: 0 %
Lymphocytes Relative: 5 %
Lymphs Abs: 0.5 10*3/uL — ABNORMAL LOW (ref 0.7–4.0)
MCH: 26.5 pg (ref 26.0–34.0)
MCHC: 33.8 g/dL (ref 30.0–36.0)
MCV: 78.3 fL — ABNORMAL LOW (ref 80.0–100.0)
Monocytes Absolute: 0.1 10*3/uL (ref 0.1–1.0)
Monocytes Relative: 1 %
Neutro Abs: 9.1 10*3/uL — ABNORMAL HIGH (ref 1.7–7.7)
Neutrophils Relative %: 94 %
Platelets: 186 10*3/uL (ref 150–400)
RBC: 4.42 MIL/uL (ref 3.87–5.11)
RDW: 14.6 % (ref 11.5–15.5)
WBC: 9.8 10*3/uL (ref 4.0–10.5)
nRBC: 0 % (ref 0.0–0.2)

## 2022-08-13 MED ORDER — TETANUS-DIPHTH-ACELL PERTUSSIS 5-2.5-18.5 LF-MCG/0.5 IM SUSY: 0.5000 mL | PREFILLED_SYRINGE | Freq: Once | INTRAMUSCULAR | Status: DC

## 2022-08-13 MED ORDER — BENZOCAINE-MENTHOL 20-0.5 % EX AERO
1.0000 | INHALATION_SPRAY | CUTANEOUS | Status: DC | PRN
  Administered 2022-08-13: 1 via TOPICAL
  Filled 2022-08-13: qty 56

## 2022-08-13 MED ORDER — PRENATAL MULTIVITAMIN CH
1.0000 | ORAL_TABLET | Freq: Every day | ORAL | Status: DC
  Administered 2022-08-13 – 2022-08-14 (×2): 1 via ORAL
  Filled 2022-08-13 (×2): qty 1

## 2022-08-13 MED ORDER — MEDROXYPROGESTERONE ACETATE 150 MG/ML IM SUSP: 150.0000 mg | INTRAMUSCULAR | Status: DC | PRN

## 2022-08-13 MED ORDER — MEASLES, MUMPS & RUBELLA VAC IJ SOLR: 0.5000 mL | Freq: Once | INTRAMUSCULAR | Status: DC

## 2022-08-13 MED ORDER — WITCH HAZEL-GLYCERIN EX PADS: 1.0000 | MEDICATED_PAD | CUTANEOUS | Status: DC | PRN

## 2022-08-13 MED ORDER — ONDANSETRON HCL 4 MG/2ML IJ SOLN: 4.0000 mg | INTRAMUSCULAR | Status: DC | PRN

## 2022-08-13 MED ORDER — NIFEDIPINE ER OSMOTIC RELEASE 30 MG PO TB24
30.0000 mg | ORAL_TABLET | Freq: Every day | ORAL | Status: DC
  Administered 2022-08-13 – 2022-08-14 (×2): 30 mg via ORAL
  Filled 2022-08-13 (×2): qty 1

## 2022-08-13 MED ORDER — ACETAMINOPHEN 325 MG PO TABS: 650.0000 mg | ORAL_TABLET | ORAL | Status: DC | PRN

## 2022-08-13 MED ORDER — FERROUS SULFATE 325 (65 FE) MG PO TABS
325.0000 mg | ORAL_TABLET | ORAL | Status: DC
  Administered 2022-08-13 – 2022-08-14 (×2): 325 mg via ORAL
  Filled 2022-08-13 (×2): qty 1

## 2022-08-13 MED ORDER — DOCUSATE SODIUM 100 MG PO CAPS
100.0000 mg | ORAL_CAPSULE | Freq: Two times a day (BID) | ORAL | Status: DC
  Administered 2022-08-13 – 2022-08-14 (×3): 100 mg via ORAL
  Filled 2022-08-13 (×3): qty 1

## 2022-08-13 MED ORDER — SIMETHICONE 80 MG PO CHEW: 80.0000 mg | CHEWABLE_TABLET | ORAL | Status: DC | PRN

## 2022-08-13 MED ORDER — COCONUT OIL OIL: 1.0000 | TOPICAL_OIL | Status: DC | PRN

## 2022-08-13 MED ORDER — MAGNESIUM SULFATE 40 GM/1000ML IV SOLN
2.0000 g/h | INTRAVENOUS | Status: AC
  Administered 2022-08-13: 2 g/h via INTRAVENOUS
  Filled 2022-08-13: qty 1000

## 2022-08-13 MED ORDER — ONDANSETRON HCL 4 MG PO TABS: 4.0000 mg | ORAL_TABLET | ORAL | Status: DC | PRN

## 2022-08-13 MED ORDER — DIBUCAINE (PERIANAL) 1 % EX OINT: 1.0000 | TOPICAL_OINTMENT | CUTANEOUS | Status: DC | PRN

## 2022-08-13 MED ORDER — IBUPROFEN 600 MG PO TABS
600.0000 mg | ORAL_TABLET | Freq: Four times a day (QID) | ORAL | Status: DC
  Administered 2022-08-13 – 2022-08-14 (×7): 600 mg via ORAL
  Filled 2022-08-13 (×7): qty 1

## 2022-08-13 MED ORDER — DIPHENHYDRAMINE HCL 25 MG PO CAPS: 25.0000 mg | ORAL_CAPSULE | Freq: Four times a day (QID) | ORAL | Status: DC | PRN

## 2022-08-13 MED ORDER — FUROSEMIDE 20 MG PO TABS
20.0000 mg | ORAL_TABLET | Freq: Every day | ORAL | Status: DC
  Administered 2022-08-13 – 2022-08-14 (×2): 20 mg via ORAL
  Filled 2022-08-13 (×2): qty 1

## 2022-08-13 NOTE — Lactation Note (Signed)
This note was copied from a baby's chart. Lactation Consultation Note  Patient Name: Kathleen Tucker CBSWH'Q Date: 08/13/2022 Reason for consult: Initial assessment;Maternal endocrine disorder Age:44 hours  Spanish interpreter used via video. P6, Assisted latching baby with good depth. Encouraged breastfeeding before offering formula. Mother declined DEBP and plans to breastfeed and supplement after with formula.  Provided mother with manual pump.   Maternal Data Has patient been taught Hand Expression?: Yes Does the patient have breastfeeding experience prior to this delivery?: Yes How long did the patient breastfeed?: 6 mos.  Feeding Mother's Current Feeding Choice: Breast Milk and Formula  LATCH Score Latch: Grasps breast easily, tongue down, lips flanged, rhythmical sucking.  Audible Swallowing: A few with stimulation  Type of Nipple: Everted at rest and after stimulation  Comfort (Breast/Nipple): Soft / non-tender  Hold (Positioning): Assistance needed to correctly position infant at breast and maintain latch.  LATCH Score: 8   Lactation Tools Discussed/Used  Manual pump  Interventions Interventions: Assisted with latch;Skin to skin;Education Consult Status Consult Status: Follow-up Date: 08/14/22 Follow-up type: In-patient    Vivianne Master [facility] 08/13/2022, 12:13 PM

## 2022-08-13 NOTE — Anesthesia Postprocedure Evaluation (Signed)
Anesthesia Post Note  Patient: Kathleen Tucker  Procedure(s) Performed: AN AD [place]     Patient location during evaluation: Mother Baby Anesthesia Type: Epidural Level of consciousness: awake and alert Pain management: pain level controlled Vital Signs Assessment: post-procedure vital signs reviewed and stable Respiratory status: spontaneous breathing, nonlabored ventilation and respiratory function stable Cardiovascular status: stable Postop Assessment: no headache, no backache, epidural receding, no apparent nausea or vomiting, patient able to bend at knees, adequate PO intake and able to ambulate Anesthetic complications: no   No notable events documented.  Last Vitals:  Vitals:   08/13/22 0630 08/13/22 0737  BP: 126/65 (!) 118/55  Pulse: 92 85  Resp: 18 16  Temp:  36.7 C  SpO2: 100% 98%    Last Pain:  Vitals:   08/13/22 0737  TempSrc: Oral  PainSc:    Pain Goal: Patients Stated Pain Goal: 3 (08/13/22 0730)                 [name]

## 2022-08-13 NOTE — Progress Notes (Signed)
Post Partum Day 1 Subjective: no complaints and tolerating PO  Objective: Blood pressure (!) 104/55, pulse 86, temperature 98 F (36.7 C), temperature source Oral, resp. rate 18, height 5\' 5"  (1.651 m), weight 90.4 kg, last menstrual period 11/04/2021, SpO2 97 %, unknown if currently breastfeeding.  Physical Exam:  General: alert, cooperative, and no distress Lochia: appropriate Uterine Fundus: firm Incision:  DVT Evaluation: No evidence of DVT seen on physical exam.  Recent Labs    08/12/22 1822 08/13/22 0014  HGB 12.2 11.7*  HCT 36.7 34.6*    Assessment/Plan: Continue magnesium til 2200 Continue procardia + lasix   LOS: 1 day   Florian Buff, MD 08/13/2022, 3:18 PM

## 2022-08-13 NOTE — Progress Notes (Signed)
Epidural cath removed at 0130, tip intact, patient tolerated well.

## 2022-08-13 NOTE — Progress Notes (Signed)
MOB was referred for history of anxiety. * Referral screened out by Clinical Social Worker because none of the following criteria appear to apply: ~ History of anxiety during this pregnancy, or of post-partum depression following prior delivery. No concerns of anxiety noted in prenatal records.  ~ Diagnosis of anxiety and/or depression within last 3 years. Per chart review, MOB's anxiety dates back to 2016.  OR * MOB's symptoms currently being treated with medication and/or therapy. Please contact the Clinical Social Worker if needs arise, by MOB request, or if MOB scores greater than 9/yes to question 10 on Edinburgh Postpartum Depression Screen.  Dyshaun Bonzo, LCSW Clinical Social Worker [facility] Cell#: [phone] 

## 2022-08-14 MED ORDER — NIFEDIPINE ER 30 MG PO TB24: 30.0000 mg | ORAL_TABLET | Freq: Every day | ORAL | 1 refills | Status: DC

## 2022-08-14 MED ORDER — IBUPROFEN 600 MG PO TABS: 600.0000 mg | ORAL_TABLET | Freq: Four times a day (QID) | ORAL | 0 refills | Status: DC

## 2022-08-14 MED ORDER — MEDROXYPROGESTERONE ACETATE 150 MG/ML IM SUSP: 150.0000 mg | INTRAMUSCULAR | 3 refills | Status: DC

## 2022-08-14 MED ORDER — FUROSEMIDE 20 MG PO TABS: 20.0000 mg | ORAL_TABLET | Freq: Two times a day (BID) | ORAL | 0 refills | Status: DC

## 2022-08-14 NOTE — Lactation Note (Signed)
This note was copied from a baby's chart. Lactation Consultation Note  Patient Name: Kathleen Tucker CLEXN'T Date: 08/14/2022 Reason for consult: Follow-up assessment;Term Age:44 hours  LC in to room, birthing parent is expecting discharge. Infant is sleeping upon arrival. Birthing parent reports latching for a few minutes prior to [facility] visit and formula feeding ~25 mL at 9 am. Birthing parent has a manual pump but has not used it yet. Reviewed paced bottle-feeding and volume guidelines for formula feeding. Discussed normal behavior and patterns after 24h, voids and stools as signs good intake, pumping, clusterfeeding, skin to skin. Talked about milk coming into volume and managing engorgement.   Plan: 1-Feeding on demand or 8-12 times in 24h period. 2-Hand express/pump as needed for supplementation 3-Encouraged birthing parent rest, hydration and food intake.   Contact LC as needed for feeds/support/concerns/questions. All questions answered at this time. Reviewed [place] brochure.     Maternal Data Does the patient have breastfeeding experience prior to this delivery?: Yes How long did the patient breastfeed?: 5-6 months per child  Feeding Mother's Current Feeding Choice: Breast Milk and Formula  Interventions Interventions: Breast feeding basics reviewed;Skin to skin;Expressed milk;Education;LC Services brochure;Hand pump;Pace feeding  Discharge Discharge Education: Engorgement and breast care;Warning signs for feeding baby Pump: Manual WIC Program: Yes  Consult Status Consult Status: Complete Date: 08/14/22 Follow-up type: Call as needed    Lorenzo 08/14/2022, 11:49 AM

## 2022-08-20 ENCOUNTER — Ambulatory Visit: Payer: No Typology Code available for payment source

## 2022-08-23 ENCOUNTER — Other Ambulatory Visit: Payer: Self-pay

## 2022-08-23 DIAGNOSIS — Z1231 Encounter for screening mammogram for malignant neoplasm of breast: Secondary | ICD-10-CM

## 2022-08-24 ENCOUNTER — Telehealth: Payer: Self-pay

## 2022-08-24 ENCOUNTER — Ambulatory Visit (INDEPENDENT_AMBULATORY_CARE_PROVIDER_SITE_OTHER): Payer: Self-pay | Admitting: *Deleted

## 2022-08-24 VITALS — BP 153/89 | HR 70 | Wt 182.1 lb

## 2022-08-24 DIAGNOSIS — Z0130 Encounter for examination of blood pressure without abnormal findings: Secondary | ICD-10-CM

## 2022-08-24 DIAGNOSIS — O165 Unspecified maternal hypertension, complicating the puerperium: Secondary | ICD-10-CM

## 2022-08-24 MED ORDER — FUROSEMIDE 20 MG PO TABS: 20.0000 mg | ORAL_TABLET | Freq: Two times a day (BID) | ORAL | 0 refills | Status: DC

## 2022-08-24 NOTE — Progress Notes (Signed)
Here for BP check s/p vaginal delivery 08/12/22 , IOL for [facility]  and A1GDM, then developed  pre-eclampsia. D/C with Procardia 30 mg daily. States never got Lasix filled. States is taking Procardia daily but has not taken it yet today. C/o constant headache x3 days =5 and head filling heavy. States tylenol and ibuprofen several times with some relief. Denies visual changes or epigastric pain. No edema noted. Discussed assessment , BPs, history with Dr. Elgie Congo. Recommends she take her procardia today asap, new RX for Lasix BId x 5 days, then blood pressure check one week. I explained recommendations to patient. Also  Preeclampsia precautions given. She voices understanding. Staci Acosta

## 2022-08-24 NOTE — Progress Notes (Signed)
Patient was assessed and managed by nursing staff during this encounter. I have reviewed the chart and agree with the documentation and plan. I have also made any necessary editorial changes.  Griffin Basil, MD 08/24/2022 12:24 PM

## 2022-08-24 NOTE — Telephone Encounter (Signed)
Called pt to review medications currently prescribed. Pt instructed to bring all medications to next nurse visit. Call completed with interpreter Claudia.

## 2022-08-31 ENCOUNTER — Ambulatory Visit: Payer: Self-pay

## 2022-09-01 ENCOUNTER — Ambulatory Visit (INDEPENDENT_AMBULATORY_CARE_PROVIDER_SITE_OTHER): Payer: Self-pay

## 2022-09-01 ENCOUNTER — Other Ambulatory Visit: Payer: Self-pay

## 2022-09-01 VITALS — BP 149/88 | HR 68 | Wt 180.0 lb

## 2022-09-01 DIAGNOSIS — I10 Essential (primary) hypertension: Secondary | ICD-10-CM

## 2022-09-01 DIAGNOSIS — Z0130 Encounter for examination of blood pressure without abnormal findings: Secondary | ICD-10-CM

## 2022-09-01 MED ORDER — LABETALOL HCL 200 MG PO TABS: 200.0000 mg | ORAL_TABLET | Freq: Two times a day (BID) | ORAL | 3 refills | Status: DC

## 2022-09-01 NOTE — Progress Notes (Signed)
Blood Pressure Check Visit  Jomarie Gellis is here for blood pressure check following spontaneous vaginal delivery on 08/12/22. History of chronic hypertension. BP today is 149/88. Patient reports intermittent headache since delivery, always resolves with rest. No headache at this time. Pt brought meds with her today. Pt reports taking Labetolol 200 mg once a day in the morning. Pt was prescribed this during pregnancy TID. Pt given nifedipine daily for PP but has not taken. Reports taking Lasix 20 mg daily for 3 days, pharmacy did not give 5 day course.  Reviewed with Currie Paris, MD who recommends pt take Labetalol 200 mg BID and return for BP check in 1 week.   Encounter completed with [facility] interpreter Cletus Gash ID [phone].  Annabell Howells, RN 09/01/2022  4:10 PM

## 2022-09-08 ENCOUNTER — Encounter: Payer: Self-pay | Admitting: *Deleted

## 2022-09-08 ENCOUNTER — Other Ambulatory Visit: Payer: Self-pay

## 2022-09-08 ENCOUNTER — Ambulatory Visit (INDEPENDENT_AMBULATORY_CARE_PROVIDER_SITE_OTHER): Payer: Medicaid Other | Admitting: General Practice

## 2022-09-08 VITALS — BP 137/71 | HR 70 | Ht 65.0 in | Wt 179.0 lb

## 2022-09-08 DIAGNOSIS — Z0130 Encounter for examination of blood pressure without abnormal findings: Secondary | ICD-10-CM

## 2022-09-08 NOTE — Progress Notes (Signed)
Patient presents to office today for blood pressure check following up from visit last week. She reports taking labetalol 200mg  BID. Patient denies headaches, dizziness, or blurry vision. BP 137/71. Patient will follow up at pp visit on 11/13.  12/13 RN BSN 09/08/22

## 2022-09-13 ENCOUNTER — Ambulatory Visit (INDEPENDENT_AMBULATORY_CARE_PROVIDER_SITE_OTHER): Payer: Self-pay | Admitting: Obstetrics and Gynecology

## 2022-09-13 ENCOUNTER — Encounter: Payer: Self-pay | Admitting: Obstetrics and Gynecology

## 2022-09-13 ENCOUNTER — Other Ambulatory Visit: Payer: Self-pay

## 2022-09-13 NOTE — Progress Notes (Signed)
Post Partum Visit Note  Kathleen Tucker is a 46 y.o. [id number] female who presents for a postpartum visit. She is 4 weeks postpartum following a normal spontaneous vaginal delivery.  I have fully reviewed the prenatal and intrapartum course. The delivery was at [redacted]w[redacted]d gestational weeks.  Anesthesia: epidural. Postpartum course has been uncomplicated. Baby is doing well. Baby is feeding by both breast and bottle -  Similac . Bleeding no bleeding. Bowel function is normal. Bladder function is normal. Patient is not sexually active. Contraception method is none. Postpartum depression screening: negative.   The pregnancy intention screening data noted above was reviewed. Potential methods of contraception were discussed. The patient elected to proceed with nexplanon, but due to insurance issues she will receive it from the health department.   Edinburgh Postnatal Depression Scale - 09/13/22 1618       Edinburgh Postnatal Depression Scale:  In the Past 7 Days   I have been able to laugh and see the funny side of things. 0    I have looked forward with enjoyment to things. 0    I have blamed myself unnecessarily when things went wrong. 0    I have been anxious or worried for no good reason. 0    I have felt scared or panicky for no good reason. 0    Things have been getting on top of me. 0    I have been so unhappy that I have had difficulty sleeping. 0    I have felt sad or miserable. 0    I have been so unhappy that I have been crying. 0    The thought of harming myself has occurred to me. 0    Edinburgh Postnatal Depression Scale Total 0             Health Maintenance Due  Topic Date Due   COVID-19 Vaccine (1) Never done   FOOT EXAM  Never done   OPHTHALMOLOGY EXAM  Never done   TETANUS/TDAP  Never done   INFLUENZA VACCINE  06/01/2022   HEMOGLOBIN A1C  07/30/2022    The following portions of the patient's history were reviewed and updated as appropriate: allergies, current  medications, past family history, past medical history, past social history, past surgical history, and problem list.  Review of Systems Pertinent items are noted in HPI.  Objective:  BP 133/83   Pulse 75   Wt 179 lb (81.2 kg)   LMP 11/04/2021 (Exact Date) Comment: spotting when not on period  Breastfeeding Yes   BMI 29.79 kg/m    General:  alert, cooperative, and no distress   Breasts:  not indicated  Lungs: clear to auscultation bilaterally  Heart:  regular rate and rhythm  Abdomen: soft, non-tender; bowel sounds normal; no masses,  no organomegaly   Wound N/a  GU exam:  not indicated       Assessment:    Encounter for postpartum care  normal postpartum exam.   Plan:   Essential components of care per ACOG recommendations:  1.  Mood and well being: Patient with negative depression screening today. Reviewed local resources for support.  - Patient tobacco use? No.   - hx of drug use? No.    2. Infant care and feeding:  -Patient currently breastmilk feeding? Yes. Reviewed importance of draining breast regularly to support lactation.  -Social determinants of health (SDOH) reviewed in EPIC. No concerns.  3. Sexuality, contraception and birth spacing - Patient does want a pregnancy  in the next year.  Desired family size is 6 children.  - Reviewed reproductive life planning. Reviewed contraceptive methods based on pt preferences and effectiveness.  Patient desired Hormonal Implant today.  Will receive through the health department - Discussed birth spacing of 18 months  4. Sleep and fatigue -Encouraged family/partner/community support of 4 hrs of uninterrupted sleep to help with mood and fatigue  5. Physical Recovery  - Discussed patients delivery and complications. She describes her labor as good. - Patient had a Vaginal problems after delivery including evidence of placental abruption . Patient had a  no  lacerations. Perineal healing reviewed. Patient expressed  understanding - Patient has urinary incontinence? No. - Patient is safe to resume physical and sexual activity  6.  Health Maintenance - HM due items addressed Yes - Last pap smear No results found for: "DIAGPAP" Pap smear not done at today's visit. Pt will get pap and mammogram through [facility], already scheduled. -Breast Cancer screening indicated? Yes. Patient referred today for mammogram.   7. Chronic Disease/Pregnancy Condition follow up: Hypertension and Gestational Diabetes Pt will reschedule for 2 hour GTT, will recheck blood pressure at GTT without meds - PCP follow up  Warden Fillers, MD Center for [facility], [facility]

## 2022-09-20 ENCOUNTER — Other Ambulatory Visit: Payer: Self-pay | Admitting: *Deleted

## 2022-09-20 DIAGNOSIS — O24429 Gestational diabetes mellitus in childbirth, unspecified control: Secondary | ICD-10-CM

## 2022-09-21 ENCOUNTER — Ambulatory Visit (INDEPENDENT_AMBULATORY_CARE_PROVIDER_SITE_OTHER): Payer: Self-pay | Admitting: General Practice

## 2022-09-21 ENCOUNTER — Other Ambulatory Visit: Payer: Self-pay

## 2022-09-21 VITALS — BP 139/50 | HR 77 | Ht 65.0 in | Wt 178.0 lb

## 2022-09-21 DIAGNOSIS — Z0130 Encounter for examination of blood pressure without abnormal findings: Secondary | ICD-10-CM

## 2022-09-21 DIAGNOSIS — O24429 Gestational diabetes mellitus in childbirth, unspecified control: Secondary | ICD-10-CM

## 2022-09-21 DIAGNOSIS — I10 Essential (primary) hypertension: Secondary | ICD-10-CM

## 2022-09-21 NOTE — Progress Notes (Signed)
Patient presents to office today for BP check following postpartum visit on 11/13 in which her labetalol prescription was discontinued. She denies headaches, dizziness or blurry vision. BP 141/74 & 139/50. Per chart review, patient has a hx of HTN which she isn't aware of. Appt made with  [facility] 2/19 @ 150pm to establish care. Discussed with patient restarting labetalol prescription and she should have enough refills to last until PCP appt. Patient verbalized understanding.   Chase Caller RN BSN 09/21/22

## 2022-09-22 LAB — GLUCOSE TOLERANCE, 2 HOURS
Glucose, 2 hour: 81 mg/dL (ref 70–139)
Glucose, GTT - Fasting: 94 mg/dL (ref 70–99)

## 2022-11-04 ENCOUNTER — Ambulatory Visit
Admission: RE | Admit: 2022-11-04 | Discharge: 2022-11-04 | Disposition: A | Discharge status: Home / Self Care | Payer: No Typology Code available for payment source | Source: Ambulatory Visit | Attending: Obstetrics and Gynecology | Admitting: Obstetrics and Gynecology

## 2022-11-04 ENCOUNTER — Ambulatory Visit: Payer: Self-pay | Admitting: Hematology and Oncology

## 2022-11-04 VITALS — BP 143/88 | Wt 178.0 lb

## 2022-11-04 DIAGNOSIS — Z01419 Encounter for gynecological examination (general) (routine) without abnormal findings: Secondary | ICD-10-CM

## 2022-11-04 DIAGNOSIS — Z1231 Encounter for screening mammogram for malignant neoplasm of breast: Secondary | ICD-10-CM

## 2022-11-04 NOTE — Progress Notes (Signed)
Kathleen Tucker is a 47 y.o. [id number] female who presents to [facility] clinic today with no complaints.    Pap Smear: Pap smear completed today. Last Pap smear was 07/14/21 and was  ASCUS/ HPV+ . Per patient has no history of an abnormal Pap smear. Last Pap smear result is not available in Epic.   Physical exam: Breasts Breasts symmetrical. No skin abnormalities bilateral breasts. No nipple retraction bilateral breasts. No nipple discharge bilateral breasts. No lymphadenopathy. No lumps palpated bilateral breasts.       Pelvic/Bimanual Ext Genitalia No lesions, no swelling and no discharge observed on external genitalia.        Vagina Vagina pink and normal texture. No lesions or discharge observed in vagina.        Cervix Cervix is present. Cervix pink and of normal texture. No discharge observed.    Uterus Uterus is present and palpable. Uterus in normal position and normal size.        Adnexae Bilateral ovaries present and palpable. No tenderness on palpation.         Rectovaginal No rectal exam completed today since patient had no rectal complaints. No skin abnormalities observed on exam.     Smoking History: Patient has never smoked and was not referred to quit line.    Patient Navigation: Patient education provided. Access to services provided for patient through [employer] interpreter provided. No transportation provided   Colorectal Cancer Screening: Per patient has never had colonoscopy completed No complaints today.    Breast and Cervical Cancer Risk Assessment: Patient does not have family history of breast cancer, known genetic mutations, or radiation treatment to the chest before age 24. Patient does not have history of cervical dysplasia, immunocompromised, or DES exposure in-utero.  Risk Scores as of 11/04/2022     [name]           5-year 0.68 %   Lifetime 8.71 %   This patient is [place]/Lat[place] but has no documented birth country, so the  [place] used data from [place] patients to calculate their risk score. Document a birth country in the Demographics activity for a more accurate score.         Last calculated by Claretha Cooper, CMA on 11/04/2022 at  9:43 AM        A: [facility] exam with pap smear No complaints with benign exam.   P: Referred patient to the [facility] for a screening mammogram. Appointment scheduled 11/04/2022.  Dayton Scrape A, NP 11/04/2022 10:00 AM

## 2022-11-04 NOTE — Patient Instructions (Signed)
Taught Kathleen Tucker about BSE and gave educational materials to take home. Patient did need a Pap smear today due to last Pap smear was in 2022 per patient. Last Pap was ASCUS/ HPV+. If today's result continues to show HPV changes, she will need colposcopy. Referred patient to the [facility] for screening mammogram. Appointment scheduled for 11/04/2022. Patient aware of appointment and will be there. Let patient know will follow up with her within the next couple weeks with results. Kathleen Tucker verbalized understanding.  Melodye Ped, NP 10:03 AM

## 2022-11-09 LAB — CYTOLOGY - PAP
Comment: NEGATIVE
Diagnosis: NEGATIVE
High risk HPV: NEGATIVE

## 2022-11-24 ENCOUNTER — Telehealth: Payer: Self-pay

## 2022-11-24 NOTE — Telephone Encounter (Signed)
With the assistance of [employer], [place], [place], [place], we attempted to call the pt with her PAP results. Pt did not answer and we were unable to leave a message as her voicemail is not setup.  I have sent the pt a MyChart message advising her PAP was normal and given her history, she will need to repeat it in one year.

## 2022-12-20 ENCOUNTER — Ambulatory Visit: Payer: Self-pay | Attending: Internal Medicine | Admitting: Internal Medicine

## 2023-01-01 IMAGING — CT CT HEAD W/O CM
3 series · 16 of 47 positions shown, 19 images · non-contrast
Comparison: None.

CLINICAL DATA: High blood pressure this morning. Headache and
numbness.

EXAM:
CT HEAD WITHOUT CONTRAST
TECHNIQUE: Contiguous axial images were obtained from the base of the skull
through the vertex without intravenous contrast.

[Series 3: head 5.0 h30s · axial · 0.41mm/px · z∈[-190,-50]mm · 10 of 34 slices shown, 13 images]
[im 3/34  brain]
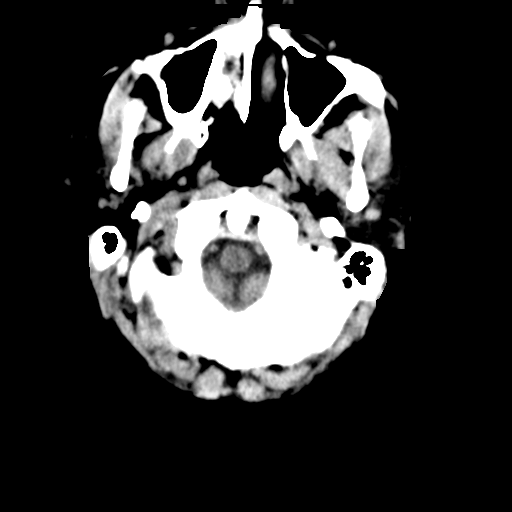
[im 3/34  bone]
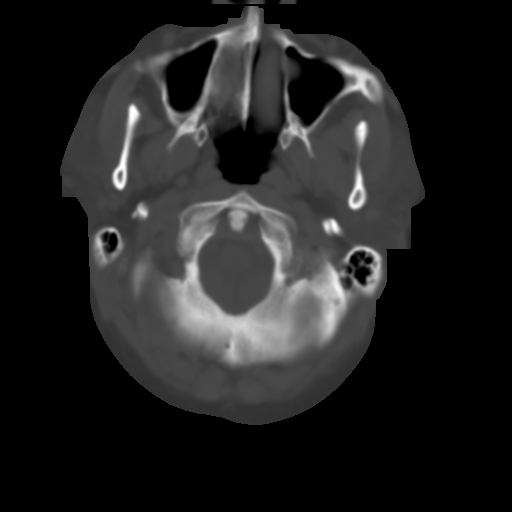
[im 6/34  brain]
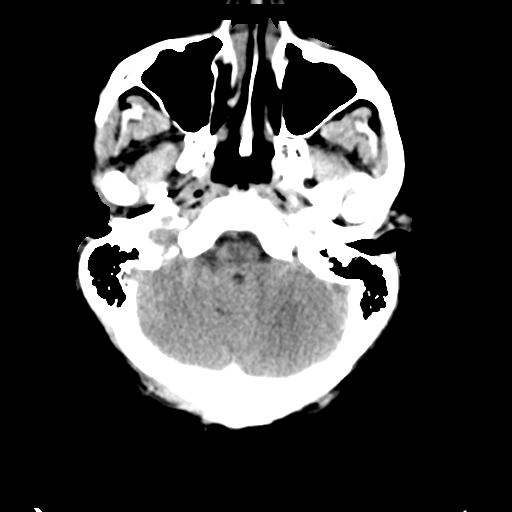
[im 10/34  brain]
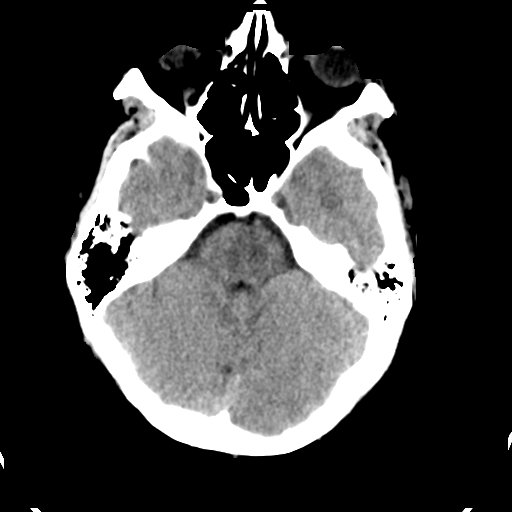
[im 12/34  brain]
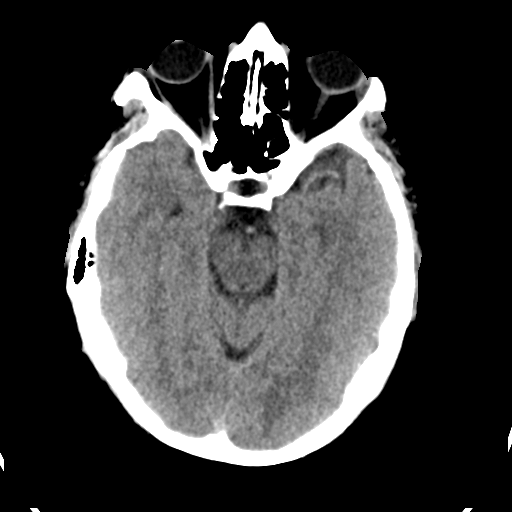
[im 15/34  brain]
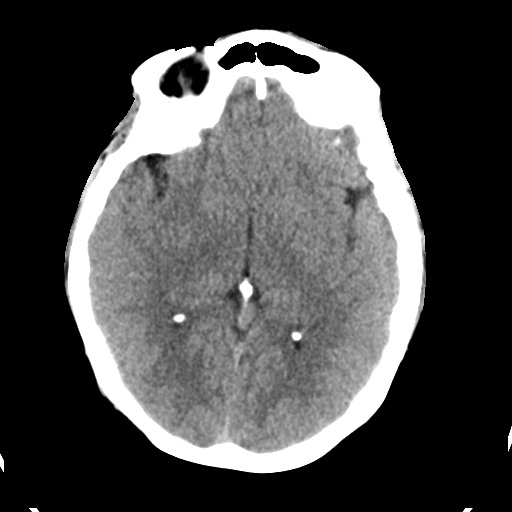
[im 15/34  bone]
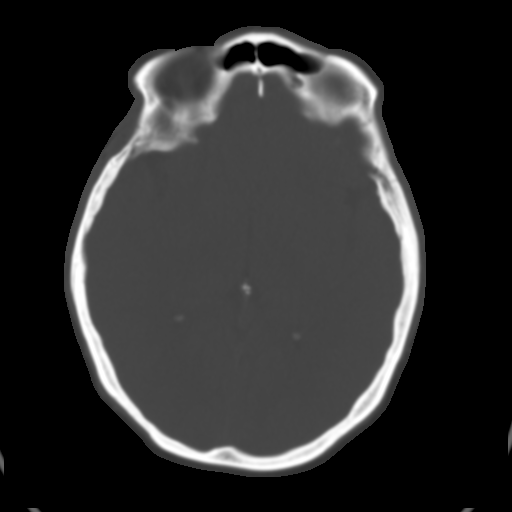
[im 19/34  brain]
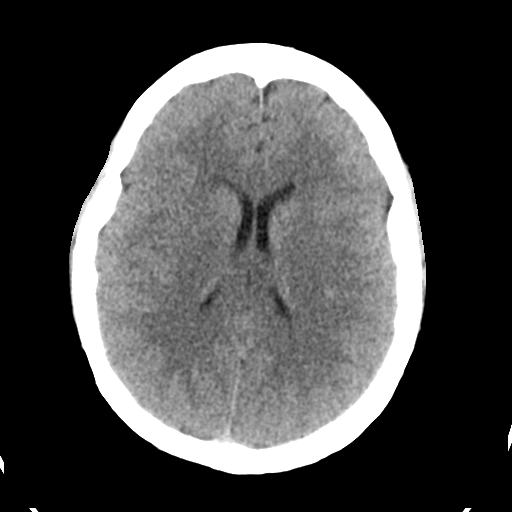
[im 22/34  brain]
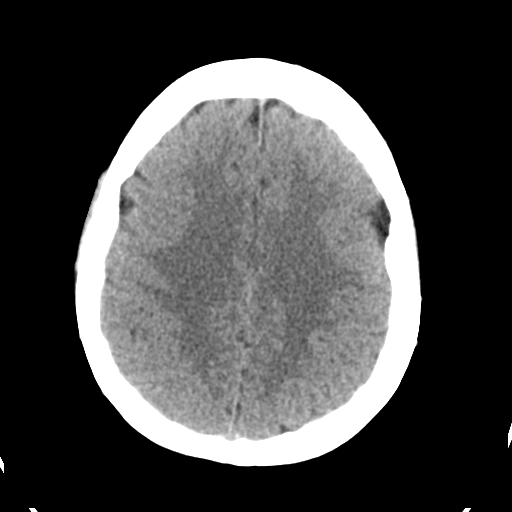
[im 26/34  brain]
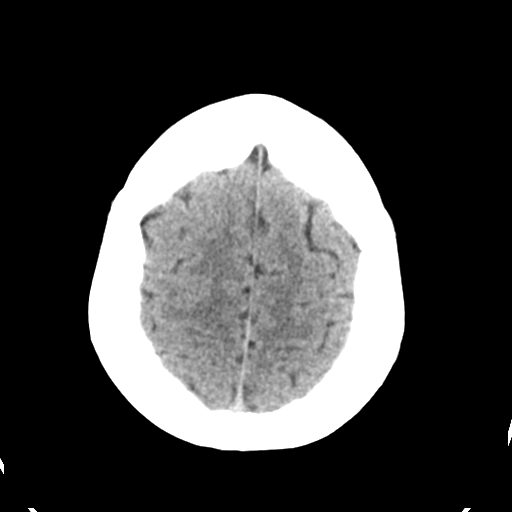
[im 28/34  brain]
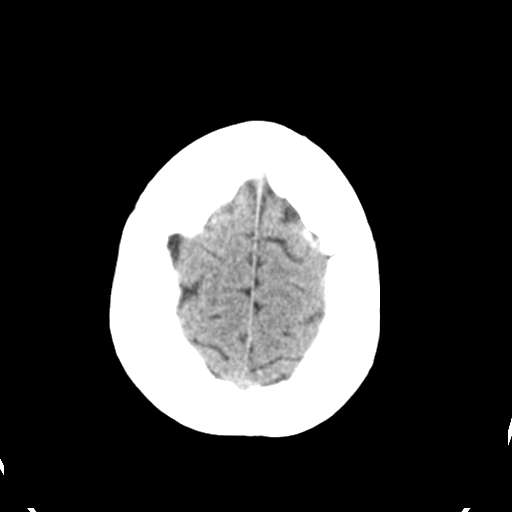
[im 28/34  bone]
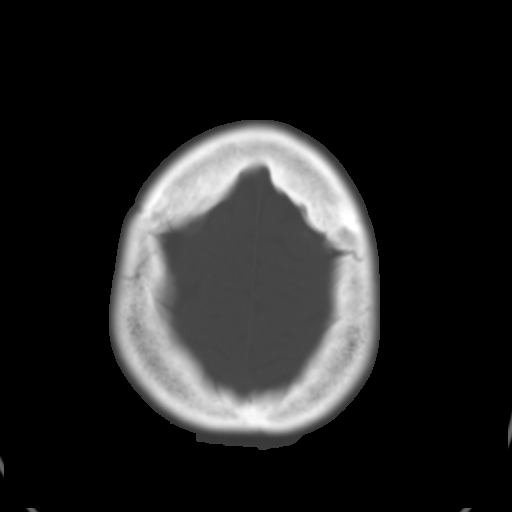
[im 31/34  brain]
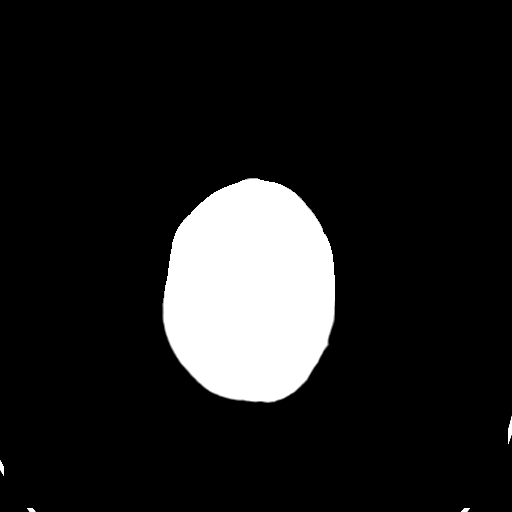

[Series 5: head 3.0 mpr cor · coronal · 0.33mm/px · 3 of 68 slices shown]
[im 23/68  brain]
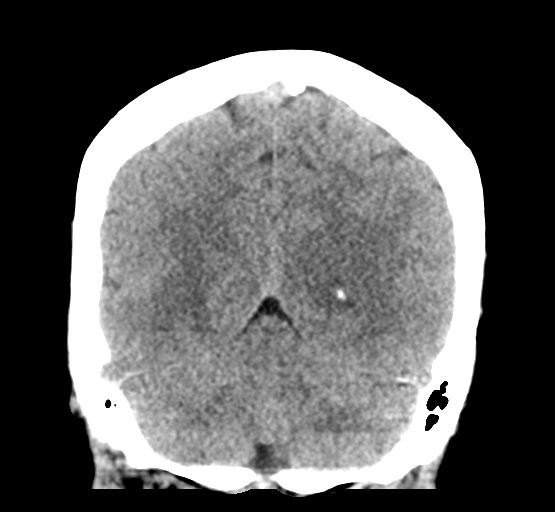
[im 30/68  brain]
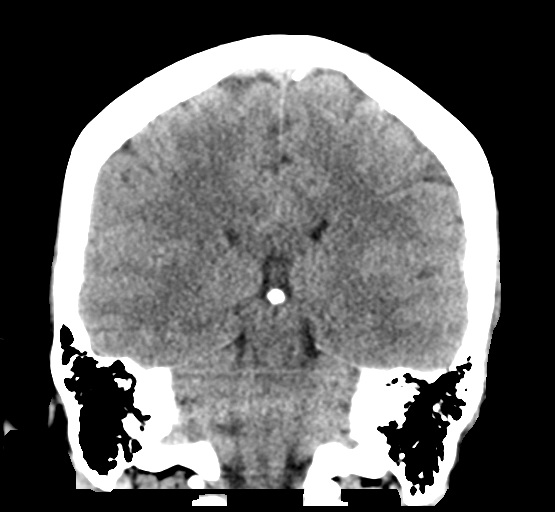
[im 38/68  brain]
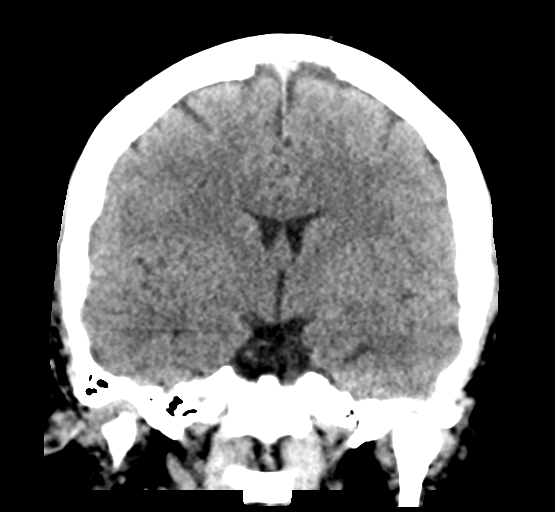

[Series 6: head 3.0 mpr sag · sagittal · 0.33mm/px · 3 of 60 slices shown]
[im 20/60  brain]
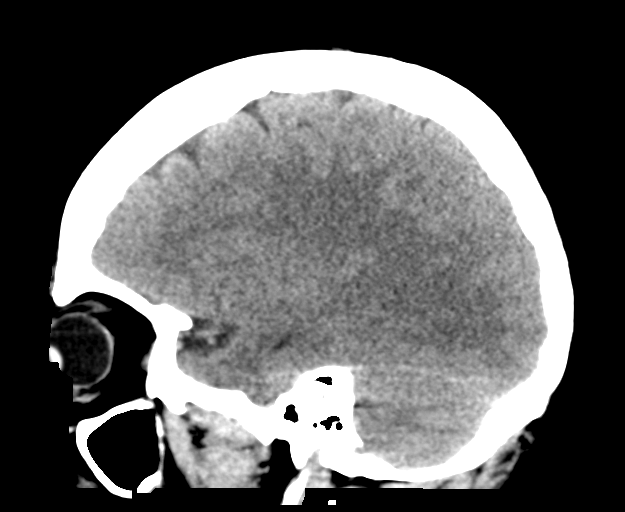
[im 30/60  brain]
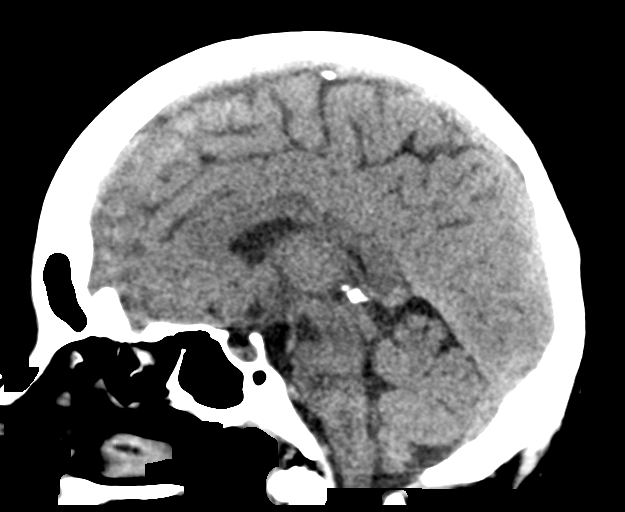
[im 40/60  brain]
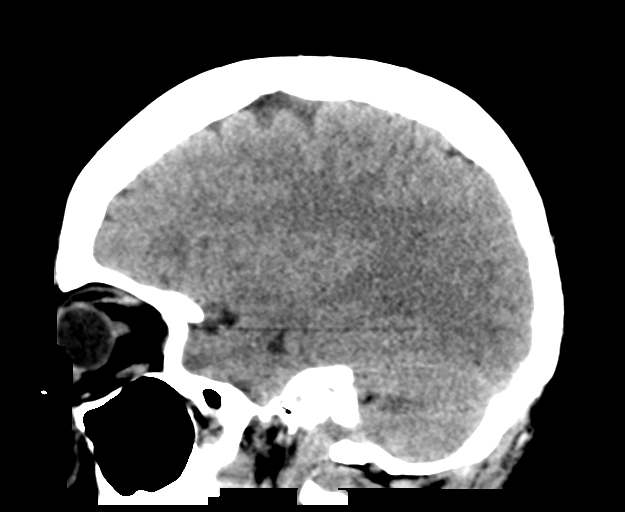

[16 of 47 positions shown; findings below may reference images not displayed]

FINDINGS: Brain: No evidence of acute infarction, hemorrhage, hydrocephalus,
extra-axial collection or mass lesion/mass effect.

Vascular: No hyperdense vessel or unexpected calcification.

Skull: The calvarium appears intact.

Sinuses/Orbits: Retention cyst in the left maxillary antrum.
Paranasal sinuses and mastoid air cells are otherwise clear.

Other: None.
IMPRESSION: No acute intracranial abnormalities.

## 2023-07-11 ENCOUNTER — Emergency Department (HOSPITAL_COMMUNITY): Payer: No Typology Code available for payment source

## 2023-07-11 ENCOUNTER — Other Ambulatory Visit: Payer: Self-pay

## 2023-07-11 ENCOUNTER — Encounter (HOSPITAL_COMMUNITY): Payer: Self-pay

## 2023-07-11 ENCOUNTER — Emergency Department (HOSPITAL_COMMUNITY)
Admission: EM | Admit: 2023-07-11 | Discharge: 2023-07-11 | Disposition: A | Discharge status: Home / Self Care | Payer: No Typology Code available for payment source | Attending: Emergency Medicine | Admitting: Emergency Medicine

## 2023-07-11 DIAGNOSIS — R0789 Other chest pain: Secondary | ICD-10-CM | POA: Insufficient documentation

## 2023-07-11 DIAGNOSIS — F1590 Other stimulant use, unspecified, uncomplicated: Secondary | ICD-10-CM

## 2023-07-11 DIAGNOSIS — R0602 Shortness of breath: Secondary | ICD-10-CM | POA: Insufficient documentation

## 2023-07-11 DIAGNOSIS — R000 Tachycardia, unspecified: Secondary | ICD-10-CM | POA: Insufficient documentation

## 2023-07-11 DIAGNOSIS — R079 Chest pain, unspecified: Secondary | ICD-10-CM

## 2023-07-11 DIAGNOSIS — F1510 Other stimulant abuse, uncomplicated: Secondary | ICD-10-CM

## 2023-07-11 LAB — CBC WITH DIFFERENTIAL/PLATELET
Abs Immature Granulocytes: 0.03 10*3/uL (ref 0.00–0.07)
Basophils Absolute: 0 10*3/uL (ref 0.0–0.1)
Basophils Relative: 0 %
Eosinophils Absolute: 0 10*3/uL (ref 0.0–0.5)
Eosinophils Relative: 0 %
HCT: 44 % (ref 36.0–46.0)
Hemoglobin: 14.6 g/dL (ref 12.0–15.0)
Immature Granulocytes: 0 %
Lymphocytes Relative: 25 %
Lymphs Abs: 2.8 10*3/uL (ref 0.7–4.0)
MCH: 26.5 pg (ref 26.0–34.0)
MCHC: 33.2 g/dL (ref 30.0–36.0)
MCV: 79.9 fL — ABNORMAL LOW (ref 80.0–100.0)
Monocytes Absolute: 0.6 10*3/uL (ref 0.1–1.0)
Monocytes Relative: 6 %
Neutro Abs: 7.7 10*3/uL (ref 1.7–7.7)
Neutrophils Relative %: 69 %
Platelets: 332 10*3/uL (ref 150–400)
RBC: 5.51 MIL/uL — ABNORMAL HIGH (ref 3.87–5.11)
RDW: 14.3 % (ref 11.5–15.5)
WBC: 11.3 10*3/uL — ABNORMAL HIGH (ref 4.0–10.5)
nRBC: 0 % (ref 0.0–0.2)

## 2023-07-11 LAB — TROPONIN I (HIGH SENSITIVITY)
Troponin I (High Sensitivity): 7 ng/L (ref <18)
Troponin I (High Sensitivity): 11 ng/L (ref <18)

## 2023-07-11 LAB — BASIC METABOLIC PANEL
Anion gap: 12 (ref 5–15)
BUN: 12 mg/dL (ref 6–20)
CO2: 21 mmol/L — ABNORMAL LOW (ref 22–32)
Calcium: 9.7 mg/dL (ref 8.9–10.3)
Chloride: 103 mmol/L (ref 98–111)
Creatinine, Ser: 0.78 mg/dL (ref 0.44–1.00)
GFR, Estimated: >60 mL/min (ref >60)
Glucose, Bld: 135 mg/dL — ABNORMAL HIGH (ref 70–99)
Potassium: 3 mmol/L — ABNORMAL LOW (ref 3.5–5.1)
Sodium: 136 mmol/L (ref 135–145)

## 2023-07-11 LAB — BRAIN NATRIURETIC PEPTIDE: B Natriuretic Peptide: 12.4 pg/mL (ref 0.0–100.0)

## 2023-07-11 LAB — HCG, SERUM, QUALITATIVE: Preg, Serum: NEGATIVE

## 2023-07-11 LAB — MAGNESIUM: Magnesium: 2.1 mg/dL (ref 1.7–2.4)

## 2023-07-11 LAB — D-DIMER, QUANTITATIVE: D-Dimer, Quant: 0.47 ug{FEU}/mL (ref 0.00–0.50)

## 2023-07-11 MED ORDER — LORAZEPAM 2 MG/ML IJ SOLN
1.0000 mg | Freq: Once | INTRAMUSCULAR | Status: AC
  Administered 2023-07-11: 1 mg via INTRAVENOUS
  Filled 2023-07-11: qty 1

## 2023-07-11 MED ORDER — LACTATED RINGERS IV BOLUS
1000.0000 mL | Freq: Once | INTRAVENOUS | Status: AC
  Administered 2023-07-11: 1000 mL via INTRAVENOUS

## 2023-07-11 MED ORDER — POTASSIUM CHLORIDE CRYS ER 20 MEQ PO TBCR
40.0000 meq | EXTENDED_RELEASE_TABLET | Freq: Once | ORAL | Status: AC
  Administered 2023-07-11: 40 meq via ORAL
  Filled 2023-07-11: qty 2

## 2023-07-11 NOTE — ED Provider Notes (Signed)
[place] EMERGENCY DEPARTMENT AT [facility] Provider Note   CSN: [id number] Arrival date & time: 07/11/23  1046     History  Chief Complaint  Patient presents with   Shortness of Breath   Tachycardia    Kathleen Tucker is a 47 y.o. female.   Shortness of Breath Associated symptoms: chest pain      47 year old female presenting to the emergency department with a chief complaint of methamphetamine use and chest pain.  The patient states that she took a [place] substance overnight and woke up this morning feeling like her heart was racing and she was having chest pressure, located left-sided and substernally without radiation.  The history was provided with the aid of a Spanish language telemetry interpreter.  She denies any nausea, vomiting, abdominal pain.  She was administered initial IV fluids and Ativan based on her presentation and she now feels symptomatically improved.  She denies any chest pain.  She has never tried methamphetamine before but thinks that that is what she ingested.  Home Medications Prior to Admission medications   Medication Sig Start Date End Date Taking? Authorizing Provider  labetalol (NORMODYNE) 200 MG tablet Take 1 tablet (200 mg total) by mouth 2 (two) times daily. 09/01/22   Lorriane Shire, MD  prenatal vitamin w/FE, FA (PRENATAL 1 + 1) 27-1 MG TABS tablet Take 1 tablet by mouth daily at 12 noon.    [provider]      Allergies    Patient has no known allergies.    Review of Systems   Review of Systems  Respiratory:  Positive for shortness of breath.   Cardiovascular:  Positive for chest pain.  All other systems reviewed and are negative.   Physical Exam Updated Vital Signs BP (!) 151/106 (BP Location: Left Arm)   Pulse (!) 116   Temp 97.9 F (36.6 C) (Oral)   Resp 17   Ht 5\' 5"  (1.651 m)   Wt 79.4 kg   SpO2 100%   BMI 29.12 kg/m  Physical Exam Vitals and nursing note reviewed.  Constitutional:       General: She is not in acute distress.    Appearance: She is well-developed.  HENT:     Head: Normocephalic and atraumatic.  Eyes:     Conjunctiva/sclera: Conjunctivae normal.  Cardiovascular:     Rate and Rhythm: Regular rhythm. Tachycardia present.  Pulmonary:     Effort: Pulmonary effort is normal. No respiratory distress.     Breath sounds: Normal breath sounds.  Abdominal:     Palpations: Abdomen is soft.     Tenderness: There is no abdominal tenderness.  Musculoskeletal:        General: No swelling.     Cervical back: Neck supple.  Skin:    General: Skin is warm and dry.     Capillary Refill: Capillary refill takes less than 2 seconds.  Neurological:     Mental Status: She is alert.  Psychiatric:        Mood and Affect: Mood normal.     ED Results / Procedures / Treatments   Labs (all labs ordered are listed, but only abnormal results are displayed) Labs Reviewed  CBC WITH DIFFERENTIAL/PLATELET - Abnormal; Notable for the following components:      Result Value   WBC 11.3 (*)    RBC 5.51 (*)    MCV 79.9 (*)    All other components within normal limits  BASIC METABOLIC PANEL - Abnormal; Notable  for the following components:   Potassium 3.0 (*)    CO2 21 (*)    Glucose, Bld 135 (*)    All other components within normal limits  HCG, SERUM, QUALITATIVE  D-DIMER, QUANTITATIVE  BRAIN NATRIURETIC PEPTIDE  MAGNESIUM  TROPONIN I (HIGH SENSITIVITY)  TROPONIN I (HIGH SENSITIVITY)    EKG EKG Interpretation Date/Time:  Monday July 11 2023 10:52:40 EDT Ventricular Rate:  143 PR Interval:  138 QRS Duration:  70 QT Interval:  272 QTC Calculation: 419 R Axis:   93  Text Interpretation: Sinus tachycardia Rightward axis Cannot rule out Anterior infarct , age undetermined ST & T wave abnormality, consider inferior ischemia Abnormal ECG When compared with ECG of 31-Mar-2021 21:54, PREVIOUS ECG IS PRESENT Confirmed by Ernie Avena ([id number]) on 07/11/2023 11:21:17  AM  Radiology CT Chest Wo Contrast  Result Date: 07/11/2023 CLINICAL DATA:  Abnormal xray - lung nodule, >= 1 cm. EXAM: CT CHEST WITHOUT CONTRAST TECHNIQUE: Multidetector CT imaging of the chest was performed following the standard protocol without IV contrast. RADIATION DOSE REDUCTION: This exam was performed according to the departmental dose-optimization program which includes automated exposure control, adjustment of the mA and/or kV according to patient size and/or use of iterative reconstruction technique. COMPARISON:  Chest radiographs 07/11/2023. FINDINGS: Cardiovascular: No significant vascular findings. Normal heart size. No pericardial effusion. Mediastinum/Nodes: No enlarged mediastinal or axillary lymph nodes. Thyroid gland, trachea, and esophagus demonstrate no significant findings. Lungs/Pleura: Lungs are clear. The previously questioned nodular opacity is resolved to represent a nipple shadow. No pleural effusion or pneumothorax. Upper Abdomen: No acute abnormality. Musculoskeletal: No chest wall mass or suspicious bone lesions identified. IMPRESSION: No acute abnormality in the chest. The previously questioned nodular opacity is resolved to represent a nipple shadow. Electronically Signed   By: Orvan Falconer M.D.   On: 07/11/2023 16:02   DG Chest 2 View  Result Date: 07/11/2023 CLINICAL DATA:  Shortness of breath and tachycardia. EXAM: CHEST - 2 VIEW COMPARISON:  03/31/2021 FINDINGS: The lungs are clear without focal pneumonia, edema, pneumothorax or pleural effusion. Small nodular density seen in the right lung base. The cardiopericardial silhouette is within normal limits for size. No acute bony abnormality. Telemetry leads overlie the chest. IMPRESSION: Small nodular density noted overlying the right lower lung. Potentially a nipple shadow. Chest CT without contrast recommended to further evaluate. Otherwise no acute cardiopulmonary findings. Electronically Signed   By: Kennith Center  M.D.   On: 07/11/2023 12:34    Procedures Procedures    Medications Ordered in ED Medications  lactated ringers bolus 1,000 mL (0 mLs Intravenous Stopped 07/11/23 1616)  LORazepam (ATIVAN) injection 1 mg (1 mg Intravenous Given 07/11/23 1123)  potassium chloride SA (KLOR-CON M) CR tablet 40 mEq (40 mEq Oral Given 07/11/23 1256)  lactated ringers bolus 1,000 mL (1,000 mLs Intravenous New Bag/Given 07/11/23 1628)    ED Course/ Medical Decision Making/ A&P                                 Medical Decision Making Amount and/or Complexity of Data Reviewed Labs: ordered. Radiology: ordered.  Risk Prescription drug management.    47 year old female presenting to the emergency department with a chief complaint of methamphetamine use and chest pain.  The patient states that she took a [place] substance overnight and woke up this morning feeling like her heart was racing and she was having chest pressure, located left-sided  and substernally without radiation.  The history was provided with the aid of a Spanish language telemetry interpreter.  She denies any nausea, vomiting, abdominal pain.  She was administered initial IV fluids and Ativan based on her presentation and she now feels symptomatically improved.  She denies any chest pain.  She has never tried methamphetamine before but thinks that that is what she ingested.  On arrival, the patient was afebrile, tachycardic heart rate 157, tachypneic RR 22, BP 163/122, saturating 100% on room air.  Concern for methamphetamine overdose/intake tox occasion.  Patient mildly agitated on arrival, tachycardic.  IV access was obtained and the patient was administered IV fluid bolus in addition to IV Ativan with subsequent improvement in her heart rate to 100.  She had endorsed and chest pressure which is also since resolved.  EKG was performed revealed sinus tachycardia, ventricular at 143, nonspecific ST changes present, no STEMI.  A chest x-ray was performed which  showed a possible nodular density which could be nipple shadow versus a pulmonary nodule.  Laboratory evaluation performed revealed hCG negative, CBC with a nonspecific leukocytosis to 11.3, hemoglobin 14.6, BMP with hypokalemia to 3.0, replenished orally, no significant AKI, creatinine 0.78, initial troponin 7, repeat troponin pending, BNP normal, D-dimer negative at 0.47.  Low concern for PE with a negative D-dimer, [place] low risk.  Magnesium was ordered given the patient's hypokalemia.  Additional IV fluid bolus was ordered.  A CT chest without contrast was obtained which revealed no evidence of pulmonary abnormality.  Plan at time of signout to follow-up patient's repeat troponin, reassess the patient, disposition likely home pending reassessment.  Signout given to Dr. Posey Rea at [phone].  Final Clinical Impression(s) / ED Diagnoses Final diagnoses:  Methamphetamine use (HCC)  Chest pain, unspecified type    Rx / DC Orders ED Discharge Orders     None         Ernie Avena, MD 07/11/23 [phone]

## 2023-07-11 NOTE — ED Notes (Signed)
PT ambulated to bathroom without difficulty 

## 2023-07-11 NOTE — ED Triage Notes (Signed)
Spanish interpreter used for triage"  Pt states she took crystal meth last night and woke up this morning feeling sob and like her heart is racing. Pt c.o some chest pain. Denies n/v

## 2023-07-11 NOTE — ED Provider Notes (Signed)
  Physical Exam  BP (!) 153/88   Pulse 95   Temp 97.9 F (36.6 C) (Oral)   Resp 15   Ht 5\' 5"  (1.651 m)   Wt 79.4 kg   SpO2 100%   BMI 29.12 kg/m   Physical Exam Vitals and nursing note reviewed.  Constitutional:      General: She is not in acute distress.    Appearance: She is well-developed.  HENT:     Head: Normocephalic and atraumatic.  Eyes:     Conjunctiva/sclera: Conjunctivae normal.  Cardiovascular:     Rate and Rhythm: Normal rate and regular rhythm.     Heart sounds: No murmur heard. Pulmonary:     Effort: Pulmonary effort is normal. No respiratory distress.     Breath sounds: Normal breath sounds.  Abdominal:     Palpations: Abdomen is soft.     Tenderness: There is no abdominal tenderness.  Musculoskeletal:        General: No swelling.     Cervical back: Neck supple.  Skin:    General: Skin is warm and dry.     Capillary Refill: Capillary refill takes less than 2 seconds.  Neurological:     Mental Status: She is alert.  Psychiatric:        Mood and Affect: Mood normal.     Procedures  Procedures  ED Course / MDM    Medical Decision Making Amount and/or Complexity of Data Reviewed Labs: ordered. Radiology: ordered.  Risk Prescription drug management.   Patient received in handoff.  Chest pain after using methamphetamine.  Pending delta troponin at time of signout.  Delta troponin reassuringly negative and symptoms improved on my reevaluation.  Patient counseled to stop using methamphetamine.  Patient discharged with outpatient follow-up and given return precautions of which she voiced understanding.       Glendora Score, MD 07/11/23 2034

## 2023-07-13 ENCOUNTER — Other Ambulatory Visit: Payer: Self-pay

## 2023-07-13 ENCOUNTER — Emergency Department (HOSPITAL_COMMUNITY): Payer: No Typology Code available for payment source

## 2023-07-13 ENCOUNTER — Emergency Department (HOSPITAL_COMMUNITY)
Admission: EM | Admit: 2023-07-13 | Discharge: 2023-07-13 | Disposition: A | Discharge status: Home / Self Care | Payer: No Typology Code available for payment source | Attending: Emergency Medicine | Admitting: Emergency Medicine

## 2023-07-13 ENCOUNTER — Encounter (HOSPITAL_COMMUNITY): Payer: Self-pay | Admitting: Emergency Medicine

## 2023-07-13 DIAGNOSIS — E119 Type 2 diabetes mellitus without complications: Secondary | ICD-10-CM | POA: Insufficient documentation

## 2023-07-13 DIAGNOSIS — M549 Dorsalgia, unspecified: Secondary | ICD-10-CM | POA: Insufficient documentation

## 2023-07-13 DIAGNOSIS — R079 Chest pain, unspecified: Secondary | ICD-10-CM | POA: Insufficient documentation

## 2023-07-13 DIAGNOSIS — N390 Urinary tract infection, site not specified: Secondary | ICD-10-CM

## 2023-07-13 DIAGNOSIS — R109 Unspecified abdominal pain: Secondary | ICD-10-CM | POA: Insufficient documentation

## 2023-07-13 DIAGNOSIS — F1510 Other stimulant abuse, uncomplicated: Secondary | ICD-10-CM

## 2023-07-13 DIAGNOSIS — B9689 Other specified bacterial agents as the cause of diseases classified elsewhere: Secondary | ICD-10-CM | POA: Insufficient documentation

## 2023-07-13 DIAGNOSIS — F1590 Other stimulant use, unspecified, uncomplicated: Secondary | ICD-10-CM | POA: Insufficient documentation

## 2023-07-13 DIAGNOSIS — R000 Tachycardia, unspecified: Secondary | ICD-10-CM | POA: Insufficient documentation

## 2023-07-13 DIAGNOSIS — Z79899 Other long term (current) drug therapy: Secondary | ICD-10-CM | POA: Insufficient documentation

## 2023-07-13 DIAGNOSIS — I10 Essential (primary) hypertension: Secondary | ICD-10-CM | POA: Insufficient documentation

## 2023-07-13 HISTORY — DX: Essential (primary) hypertension: I10

## 2023-07-13 LAB — BASIC METABOLIC PANEL
Anion gap: 9 (ref 5–15)
BUN: 8 mg/dL (ref 6–20)
CO2: 22 mmol/L (ref 22–32)
Calcium: 8.6 mg/dL — ABNORMAL LOW (ref 8.9–10.3)
Chloride: 107 mmol/L (ref 98–111)
Creatinine, Ser: 0.7 mg/dL (ref 0.44–1.00)
GFR, Estimated: >60 mL/min (ref >60)
Glucose, Bld: 96 mg/dL (ref 70–99)
Potassium: 3.8 mmol/L (ref 3.5–5.1)
Sodium: 138 mmol/L (ref 135–145)

## 2023-07-13 LAB — CBC
HCT: 48.2 % — ABNORMAL HIGH (ref 36.0–46.0)
Hemoglobin: 16 g/dL — ABNORMAL HIGH (ref 12.0–15.0)
MCH: 26.8 pg (ref 26.0–34.0)
MCHC: 33.2 g/dL (ref 30.0–36.0)
MCV: 80.6 fL (ref 80.0–100.0)
Platelets: 353 10*3/uL (ref 150–400)
RBC: 5.98 MIL/uL — ABNORMAL HIGH (ref 3.87–5.11)
RDW: 14.9 % (ref 11.5–15.5)
WBC: 14.1 10*3/uL — ABNORMAL HIGH (ref 4.0–10.5)
nRBC: 0 % (ref 0.0–0.2)

## 2023-07-13 LAB — URINALYSIS, ROUTINE W REFLEX MICROSCOPIC
Bilirubin Urine: NEGATIVE
Glucose, UA: NEGATIVE mg/dL
Ketones, ur: NEGATIVE mg/dL
Nitrite: NEGATIVE
Protein, ur: NEGATIVE mg/dL
Specific Gravity, Urine: 1.003 — ABNORMAL LOW (ref 1.005–1.030)
pH: 8 (ref 5.0–8.0)

## 2023-07-13 LAB — TROPONIN I (HIGH SENSITIVITY)
Troponin I (High Sensitivity): 4 ng/L (ref <18)
Troponin I (High Sensitivity): 4 ng/L (ref <18)

## 2023-07-13 LAB — RAPID URINE DRUG SCREEN, HOSP PERFORMED
Amphetamines: POSITIVE — AB
Barbiturates: NOT DETECTED
Benzodiazepines: NOT DETECTED
Cocaine: NOT DETECTED
Opiates: NOT DETECTED
Tetrahydrocannabinol: NOT DETECTED

## 2023-07-13 LAB — TSH: TSH: 1.834 u[IU]/mL (ref 0.350–4.500)

## 2023-07-13 MED ORDER — MORPHINE SULFATE (PF) 4 MG/ML IV SOLN
4.0000 mg | Freq: Once | INTRAVENOUS | Status: AC
  Administered 2023-07-13: 4 mg via INTRAVENOUS
  Filled 2023-07-13: qty 1

## 2023-07-13 MED ORDER — ONDANSETRON HCL 4 MG/2ML IJ SOLN
4.0000 mg | Freq: Once | INTRAMUSCULAR | Status: AC
  Administered 2023-07-13: 4 mg via INTRAVENOUS
  Filled 2023-07-13: qty 2

## 2023-07-13 MED ORDER — SODIUM CHLORIDE 0.9 % IV SOLN
1.0000 g | Freq: Once | INTRAVENOUS | Status: AC
  Administered 2023-07-13: 1 g via INTRAVENOUS
  Filled 2023-07-13: qty 10

## 2023-07-13 MED ORDER — CEPHALEXIN 500 MG PO CAPS: 500.0000 mg | ORAL_CAPSULE | Freq: Four times a day (QID) | ORAL | 0 refills | Status: DC

## 2023-07-13 MED ORDER — SODIUM CHLORIDE 0.9 % IV BOLUS
1000.0000 mL | Freq: Once | INTRAVENOUS | Status: AC
  Administered 2023-07-13: 1000 mL via INTRAVENOUS

## 2023-07-13 NOTE — ED Provider Notes (Signed)
[place] EMERGENCY DEPARTMENT AT [facility] Provider Note   CSN: [id number] Arrival date & time: 07/13/23  9528     History  Chief Complaint  Patient presents with   Chest Pain   Back Pain    Kathleen Tucker is a 47 y.o. female.  The history is provided by the patient and medical records. The history is limited by a language barrier. A language interpreter was used.  Chest Pain Associated symptoms: back pain   Back Pain Associated symptoms: chest pain      47 year old Hispanic speaking female significant history of diabetes, anxiety, hypertension, polysubstance use presenting with complaint of chest pain.  History obtained using language interpreter.  For the past 4 days patient has had pain in her chest radiates to her back having trouble with feeling anxious, shaky, feeling weak, having heart palpitation, and she also noticed blood in the urine with increased urinary frequency.  Symptom initially was started when she went to the ER for same several days ago.  It briefly got better but now returned.  She admits to crystal meth use.  She has never used before.  She denies having hallucination denies fever.  No history of IV drug use or alcohol abuse.  No prior history of thyroid disease.  No history of kidney stone.  Patient has Nexplanon.  Home Medications Prior to Admission medications   Medication Sig Start Date End Date Taking? Authorizing Provider  labetalol (NORMODYNE) 200 MG tablet Take 1 tablet (200 mg total) by mouth 2 (two) times daily. 09/01/22   Lorriane Shire, MD  prenatal vitamin w/FE, FA (PRENATAL 1 + 1) 27-1 MG TABS tablet Take 1 tablet by mouth daily at 12 noon.    [provider]      Allergies    Patient has no known allergies.    Review of Systems   Review of Systems  Cardiovascular:  Positive for chest pain.  Musculoskeletal:  Positive for back pain.  All other systems reviewed and are negative.   Physical Exam Updated  Vital Signs BP (!) 141/80 (BP Location: Right Arm)   Pulse (!) 133   Temp 98.2 F (36.8 C) (Oral)   Resp 18   Ht 5\' 5"  (1.651 m)   Wt 79 kg   SpO2 100%   BMI 28.98 kg/m  Physical Exam Vitals and nursing note reviewed.  Constitutional:      Appearance: She is well-developed.     Comments: Appears anxious but nontoxic  HENT:     Head: Atraumatic.  Eyes:     Conjunctiva/sclera: Conjunctivae normal.  Neck:     Comments: No thyromegaly- Cardiovascular:     Rate and Rhythm: Tachycardia present.     Pulses: Normal pulses.     Heart sounds: Normal heart sounds.  Pulmonary:     Effort: Pulmonary effort is normal.     Breath sounds: No wheezing, rhonchi or rales.  Abdominal:     Palpations: Abdomen is soft.     Tenderness: There is no abdominal tenderness. There is no right CVA tenderness or left CVA tenderness.  Musculoskeletal:        General: Normal range of motion.     Cervical back: Neck supple.  Skin:    Capillary Refill: Capillary refill takes less than 2 seconds.     Findings: No rash.  Neurological:     Mental Status: She is alert. Mental status is at baseline.  Psychiatric:  Mood and Affect: Mood normal.     ED Results / Procedures / Treatments   Labs (all labs ordered are listed, but only abnormal results are displayed) Labs Reviewed  CBC - Abnormal; Notable for the following components:      Result Value   WBC 14.1 (*)    RBC 5.98 (*)    Hemoglobin 16.0 (*)    HCT 48.2 (*)    All other components within normal limits  RAPID URINE DRUG SCREEN, HOSP PERFORMED - Abnormal; Notable for the following components:   Amphetamines POSITIVE (*)    All other components within normal limits  URINALYSIS, ROUTINE W REFLEX MICROSCOPIC - Abnormal; Notable for the following components:   APPearance HAZY (*)    Specific Gravity, Urine 1.003 (*)    Hgb urine dipstick LARGE (*)    Leukocytes,Ua LARGE (*)    Bacteria, UA FEW (*)    All other components within normal  limits  BASIC METABOLIC PANEL - Abnormal; Notable for the following components:   Calcium 8.6 (*)    All other components within normal limits  TSH  TROPONIN I (HIGH SENSITIVITY)  TROPONIN I (HIGH SENSITIVITY)    EKG EKG Interpretation Date/Time:  Wednesday July 13 2023 09:51:55 EDT Ventricular Rate:  122 PR Interval:  138 QRS Duration:  81 QT Interval:  306 QTC Calculation: 436 R Axis:   5  Text Interpretation: Sinus tachycardia Borderline T abnormalities, inferior leads Confirmed by Margarita Grizzle [phone]) on 07/13/2023 11:34:09 AM  Radiology CT Renal Stone Study  Result Date: 07/13/2023 CLINICAL DATA:  47 year old female with abdomen and flank pain, shortness of breath and palpitations after methamphetamine use. EXAM: CT ABDOMEN AND PELVIS WITHOUT CONTRAST TECHNIQUE: Multidetector CT imaging of the abdomen and pelvis was performed following the standard protocol without IV contrast. RADIATION DOSE REDUCTION: This exam was performed according to the departmental dose-optimization program which includes automated exposure control, adjustment of the mA and/or kV according to patient size and/or use of iterative reconstruction technique. COMPARISON:  Portable chest 1029 hours today. CT Abdomen and Pelvis 11/20/2021. FINDINGS: Lower chest: Negative.  No pericardial or pleural effusion. Hepatobiliary: Negative noncontrast liver and gallbladder. Pancreas: Negative. Spleen: Negative. Adrenals/Urinary Tract: Normal adrenal glands. Noncontrast kidneys appear symmetric and nonobstructed. No nephrolithiasis or evidence of renal inflammation. Decompressed ureters. Distended but otherwise unremarkable urinary bladder. Chronic pelvic phleboliths. Stomach/Bowel: Mostly decompressed large and small bowel. Normal appendix on coronal image 93. Decompressed stomach. Probable phrenic ampulla rather than small hiatal hernia at the gastroesophageal junction is stable (variant). No free air, free fluid, or  mesenteric inflammation identified. Vascular/Lymphatic: Normal caliber abdominal aorta. Vascular patency is not evaluated in the absence of IV contrast. No calcified atherosclerosis or lymphadenopathy identified. Reproductive: Negative noncontrast appearance. Other: No pelvis free fluid.  Incidental phleboliths. Musculoskeletal: No acute osseous abnormality identified. Disc and some endplate degeneration at the lumbosacral junction. IMPRESSION: No urinary calculi. No acute or inflammatory process identified in the noncontrast abdomen or pelvis. Electronically Signed   By: Odessa Fleming M.D.   On: 07/13/2023 13:04   DG Chest Port 1 View  Result Date: 07/13/2023 CLINICAL DATA:  Chest pain EXAM: PORTABLE CHEST 1 VIEW COMPARISON:  07/11/2023 FINDINGS: Cardiac and mediastinal contours are within normal limits. No focal pulmonary opacity. Previously noted nodular density overlying the right lower lung is not seen the current exam. No pleural effusion or pneumothorax. No acute osseous abnormality. IMPRESSION: No acute cardiopulmonary process. Electronically Signed   By: Elaina Pattee.D.  On: 07/13/2023 11:22    Procedures Procedures    Medications Ordered in ED Medications  sodium chloride 0.9 % bolus 1,000 mL (0 mLs Intravenous Stopped 07/13/23 1134)  cefTRIAXone (ROCEPHIN) 1 g in sodium chloride 0.9 % 100 mL IVPB (0 g Intravenous Stopped 07/13/23 1406)  morphine (PF) 4 MG/ML injection 4 mg (4 mg Intravenous Given 07/13/23 1327)  ondansetron (ZOFRAN) injection 4 mg (4 mg Intravenous Given 07/13/23 1326)    ED Course/ Medical Decision Making/ A&P                                 Medical Decision Making Amount and/or Complexity of Data Reviewed Labs: ordered. Radiology: ordered.  Risk Prescription drug management.   BP (!) 141/80 (BP Location: Right Arm)   Pulse (!) 133   Temp 98.2 F (36.8 C) (Oral)   Resp 18   Ht 5\' 5"  (1.651 m)   Wt 79 kg   SpO2 100%   BMI 28.98 kg/m   55:79 AM   47 year old Hispanic speaking female significant history of diabetes, anxiety, hypertension, polysubstance use presenting with complaint of chest pain.  History obtained using language interpreter.  For the past 4 days patient has had pain in her chest radiates to her back having trouble with feeling anxious, shaky, feeling weak, having heart palpitation, and she also noticed blood in the urine with increased urinary frequency.  Symptom initially was started when she went to the ER for same several days ago.  It briefly got better but now returned.  She admits to crystal meth use.  She has never used before.  She denies having hallucination denies fever.  No history of IV drug use or alcohol abuse.  No prior history of thyroid disease.  No history of kidney stone.  Patient has Nexplanon.  On exam patient appears a bit anxious but nontoxic in appearance.  She has normal phonation.  She is tachycardic but lungs are clear to auscultation bilaterally abdomen is soft nontender no CVA tenderness.  She is moving all 4 extremities.  She does not have any signs of thyromegaly.  No hoarseness normal phonation.  Vitals are notable for tachycardia with heart rate of 133.  She is afebrile no hypoxia.  EMR reviewed patient was seen 2 days ago for same presentation after using methamphetamine.  Workup including chest CT without any acute finding.  Patient has negative delta troponin during that visit.  She was subsequently discharged home after she received IV fluid and Ativan.  EMR reviewed from 2 days prior.  Patient had a fairly extensive workup which includes BMP, D-dimer, troponin, chest CT, electrolyte panel and without any acute finding.  Patient remains tachycardic with current heart rate of 133.  She does not appear dehydrated.  She does however is a bit anxious.  She denies any new drug use since the last use of crystal meth.  -Labs ordered, independently viewed and interpreted by me.  Labs remarkable for normal  TSH.  UDS positive for amphetamine. Trop neg, doubt ACS.  UA showing UTI.  WBC elevated at 14.1 -The patient was maintained on a cardiac monitor.  I personally viewed and interpreted the cardiac monitored which showed an underlying rhythm of: sinus tachycardia -Imaging independently viewed and interpreted by me and I agree with radiologist's interpretation.  Result remarkable for CT abd/pelvis without acute changes.  CXR unremarkable.  -This patient presents to the ED for concern  of aches and pain, this involves an extensive number of treatment options, and is a complaint that carries with it a high risk of complications and morbidity.  The differential diagnosis includes drug induced -Co morbidities that complicate the patient evaluation includes language barrier, diabetes, anxiety -Treatment includes rocephin, morphine, zofran, ivf -Reevaluation of the patient after these medicines showed that the patient improved -PCP office notes or outside notes reviewed -Escalation to admission/observation considered: patients feels much better, is comfortable with discharge, and will follow up with PCP -Prescription medication considered, patient comfortable with keflex -Social Determinant of Health considered which includes drug use         Final Clinical Impression(s) / ED Diagnoses Final diagnoses:  Acute lower UTI  Methamphetamine use disorder, mild (HCC)    Rx / DC Orders ED Discharge Orders          Ordered    cephALEXin (KEFLEX) 500 MG capsule  4 times daily        07/13/23 1540              Fayrene Helper, PA-C 07/13/23 1541    Margarita Grizzle, MD 07/16/23 1207

## 2023-07-13 NOTE — ED Triage Notes (Signed)
Using interpreter, pt endorses blood in urination, left sided back pain, chest pain and left side weakness since last night. Pt endorses drug use Sunday that she believes was crystal meth. Endorses pain with urination.

## 2023-08-10 ENCOUNTER — Emergency Department (HOSPITAL_COMMUNITY): Payer: No Typology Code available for payment source

## 2023-08-10 ENCOUNTER — Emergency Department (HOSPITAL_COMMUNITY)
Admission: EM | Admit: 2023-08-10 | Discharge: 2023-08-10 | Disposition: A | Discharge status: Home / Self Care | Payer: No Typology Code available for payment source | Attending: Emergency Medicine | Admitting: Emergency Medicine

## 2023-08-10 ENCOUNTER — Encounter (HOSPITAL_COMMUNITY): Payer: Self-pay | Admitting: Emergency Medicine

## 2023-08-10 ENCOUNTER — Other Ambulatory Visit: Payer: Self-pay

## 2023-08-10 DIAGNOSIS — R519 Headache, unspecified: Secondary | ICD-10-CM | POA: Insufficient documentation

## 2023-08-10 DIAGNOSIS — E119 Type 2 diabetes mellitus without complications: Secondary | ICD-10-CM | POA: Insufficient documentation

## 2023-08-10 DIAGNOSIS — I10 Essential (primary) hypertension: Secondary | ICD-10-CM | POA: Insufficient documentation

## 2023-08-10 DIAGNOSIS — R42 Dizziness and giddiness: Secondary | ICD-10-CM | POA: Insufficient documentation

## 2023-08-10 DIAGNOSIS — E876 Hypokalemia: Secondary | ICD-10-CM | POA: Insufficient documentation

## 2023-08-10 DIAGNOSIS — R002 Palpitations: Secondary | ICD-10-CM | POA: Insufficient documentation

## 2023-08-10 LAB — COMPREHENSIVE METABOLIC PANEL
ALT: 17 U/L (ref 0–44)
AST: 21 U/L (ref 15–41)
Albumin: 4.2 g/dL (ref 3.5–5.0)
Alkaline Phosphatase: 54 U/L (ref 38–126)
Anion gap: 13 (ref 5–15)
BUN: 15 mg/dL (ref 6–20)
CO2: 22 mmol/L (ref 22–32)
Calcium: 9.4 mg/dL (ref 8.9–10.3)
Chloride: 104 mmol/L (ref 98–111)
Creatinine, Ser: 0.8 mg/dL (ref 0.44–1.00)
GFR, Estimated: >60 mL/min (ref >60)
Glucose, Bld: 134 mg/dL — ABNORMAL HIGH (ref 70–99)
Potassium: 3.1 mmol/L — ABNORMAL LOW (ref 3.5–5.1)
Sodium: 139 mmol/L (ref 135–145)
Total Bilirubin: 0.4 mg/dL (ref 0.3–1.2)
Total Protein: 7.6 g/dL (ref 6.5–8.1)

## 2023-08-10 LAB — URINALYSIS, ROUTINE W REFLEX MICROSCOPIC
Bilirubin Urine: NEGATIVE
Glucose, UA: NEGATIVE mg/dL
Ketones, ur: NEGATIVE mg/dL
Nitrite: NEGATIVE
Protein, ur: NEGATIVE mg/dL
Specific Gravity, Urine: 1.012 (ref 1.005–1.030)
pH: 5 (ref 5.0–8.0)

## 2023-08-10 LAB — CBC
HCT: 42.9 % (ref 36.0–46.0)
Hemoglobin: 14.1 g/dL (ref 12.0–15.0)
MCH: 26.2 pg (ref 26.0–34.0)
MCHC: 32.9 g/dL (ref 30.0–36.0)
MCV: 79.6 fL — ABNORMAL LOW (ref 80.0–100.0)
Platelets: 264 10*3/uL (ref 150–400)
RBC: 5.39 MIL/uL — ABNORMAL HIGH (ref 3.87–5.11)
RDW: 14.2 % (ref 11.5–15.5)
WBC: 6.4 10*3/uL (ref 4.0–10.5)
nRBC: 0 % (ref 0.0–0.2)

## 2023-08-10 LAB — RAPID URINE DRUG SCREEN, HOSP PERFORMED
Amphetamines: NOT DETECTED
Barbiturates: NOT DETECTED
Benzodiazepines: NOT DETECTED
Cocaine: NOT DETECTED
Opiates: NOT DETECTED
Tetrahydrocannabinol: NOT DETECTED

## 2023-08-10 LAB — T4, FREE: Free T4: 0.84 ng/dL (ref 0.61–1.12)

## 2023-08-10 LAB — TSH: TSH: 1.852 u[IU]/mL (ref 0.350–4.500)

## 2023-08-10 LAB — MAGNESIUM: Magnesium: 2.1 mg/dL (ref 1.7–2.4)

## 2023-08-10 LAB — HCG, SERUM, QUALITATIVE: Preg, Serum: NEGATIVE

## 2023-08-10 LAB — TROPONIN I (HIGH SENSITIVITY)
Troponin I (High Sensitivity): 3 ng/L (ref <18)
Troponin I (High Sensitivity): 6 ng/L (ref <18)

## 2023-08-10 MED ORDER — ACETAMINOPHEN 500 MG PO TABS
1000.0000 mg | ORAL_TABLET | Freq: Once | ORAL | Status: AC
  Administered 2023-08-10: 1000 mg via ORAL
  Filled 2023-08-10: qty 2

## 2023-08-10 MED ORDER — POTASSIUM CHLORIDE CRYS ER 20 MEQ PO TBCR
40.0000 meq | EXTENDED_RELEASE_TABLET | Freq: Once | ORAL | Status: AC
  Administered 2023-08-10: 40 meq via ORAL
  Filled 2023-08-10: qty 2

## 2023-08-10 MED ORDER — IBUPROFEN 400 MG PO TABS
400.0000 mg | ORAL_TABLET | Freq: Once | ORAL | Status: AC
  Administered 2023-08-10: 400 mg via ORAL
  Filled 2023-08-10: qty 1

## 2023-08-10 NOTE — ED Provider Triage Note (Signed)
Emergency Medicine Provider Triage Evaluation Note  Rainee Sweatt , a 47 y.o. female  was evaluated in triage.  Pt complains of palpitations.  Review of Systems  Positive: Sob  Negative: fever  Physical Exam  BP (!) 158/92   Pulse (!) 114   Temp 97.9 F (36.6 C)   Resp 16   Wt 79 kg   SpO2 100%   BMI 28.98 kg/m  Gen:   Awake, anxious Resp:  Normal effort  MSK:   Moves extremities without difficulty  Other:  No arm/leg drift  Medical Decision Making  Medically screening exam initiated at 2:31 AM.  Appropriate orders placed.  Jessee Newnam was informed that the remainder of the evaluation will be completed by another provider, this initial triage assessment does not replace that evaluation, and the importance of remaining in the ED until their evaluation is complete.  Workup for palpitations History obtained via interpreter    Zadie Rhine, MD 08/10/23 [phone]

## 2023-08-10 NOTE — ED Triage Notes (Signed)
Pt presents from home for palpitations, CP, HA, and chills. EDP at bedside at time of triage.  Pt denies etOH or recreational drugs

## 2023-08-10 NOTE — Discharge Instructions (Signed)
Thank you for coming to [facility] Emergency Department. You were seen for palpitations, dizziness. We did an exam, labs, and imaging, and these showed no acute findings.  Please stay well-hydrated at home and decrease your tea/caffeine intake.  You can also try deep breathing exercises or therapy to help reduce her stress level. Please follow up with your primary care provider within 1 week.  You can establish with a primary care physician by calling health connect or by going on to the [facility] website.  It is possible that you will need a Holter monitor to look at your heart rhythms over a longer period of time.  Do not hesitate to return to the ED or call 911 if you experience: -Worsening symptoms -Chest pain, shortness of breath -Lightheadedness, passing out -Fevers/chills -Anything else that concerns you   Gracias por venir al [facility]. Lo atendieron por palpitaciones, mareos. Hicimos un examen, laboratorios e imgenes, y no mostraron hallazgos agudos.  Mantngase bien hidratado en casa y reduzca su consumo de t y cafena.  Tambin puede probar ejercicios de respiracin profunda o terapia para ayudar a reducir su nivel de estrs. Haga un seguimiento con su proveedor de atencin primaria dentro de 1 semana.  Puede establecer contacto con un mdico de atencin primaria llamando a [facility] o visitando el sitio web de [place].  Es posible que necesite un monitor Holter para observar su ritmo cardaco durante un perodo de tiempo ms largo.  No dude en regresar al servicio de urgencias o llamar al 911 si experimenta: -Empeoramiento de los sntomas -Dolor en el pecho, dificultad para respirar. -Aturdimiento, desmayo. -Fiebre/escalofros -Cualquier otra cosa que te preocupe

## 2023-08-10 NOTE — ED Provider Notes (Signed)
[place] EMERGENCY DEPARTMENT AT [facility] Provider Note   CSN: [id number] Arrival date & time: 08/10/23  0206     History  Chief Complaint  Patient presents with   Shortness of Breath    Kathleen Tucker is a 47 y.o. female with PMH as listed below who presents with palpitations that began yesterday acutely. Felt very lightheaded/dizzy, mildly SOB, weak, nausea. No h/o similar. No chest pain. Did not lose consciousness or fall. No recent illnesses, f/c.  Reports a lot of stress in her life recently and states that this sometimes happens when she feels anxious. Drinks tea daily, some coffee. Denies illicit drugs, alcohol, tobacco, vaping.  No recent hospitalizations, surgeries, travel.  No history of DVT or PE.  Does have the Nexplanon for birth control. She also endorses a headache since being in the ED after the dizziness had subsided, no FNDs or visual changes.    Past Medical History:  Diagnosis Date   AMA (advanced maternal age) multigravida 35+    Anemia    Anxiety    celexa   Diabetes mellitus without complication (HCC) 2016   current/last pregnancy   Gestational diabetes 01/27/2022   Hypertension        Home Medications Prior to Admission medications   Medication Sig Start Date End Date Taking? Authorizing Provider  cephALEXin (KEFLEX) 500 MG capsule Take 1 capsule (500 mg total) by mouth 4 (four) times daily. 07/13/23   Fayrene Helper, PA-C  labetalol (NORMODYNE) 200 MG tablet Take 1 tablet (200 mg total) by mouth 2 (two) times daily. 09/01/22   Lorriane Shire, MD  prenatal vitamin w/FE, FA (PRENATAL 1 + 1) 27-1 MG TABS tablet Take 1 tablet by mouth daily at 12 noon.    [provider]      Allergies    Patient has no known allergies.    Review of Systems   Review of Systems A 10 point review of systems was performed and is negative unless otherwise reported in HPI.  Physical Exam Updated Vital Signs BP 138/84   Pulse 81   Temp 97.8  F (36.6 C) (Oral)   Resp 18   Wt 79 kg   SpO2 100%   BMI 28.98 kg/m  Physical Exam General: Normal appearing female, lying in bed.  HEENT: PERRLA, EOMI, Sclera anicteric, MMM, trachea midline. NCAT. Neck supple.  Cardiology: RRR, no murmurs/rubs/gallops. Resp: Normal respiratory rate and effort. CTAB, no wheezes, rhonchi, crackles.  Abd: Soft, non-tender, non-distended. No rebound tenderness or guarding.  GU: Deferred. MSK: No peripheral edema or signs of trauma.  Skin: warm, dry. Neuro: A&Ox4, CNs II-XII grossly intact. MAEs. Sensation grossly intact. Normal speech. Psych: Normal mood and affect.   ED Results / Procedures / Treatments   Labs (all labs ordered are listed, but only abnormal results are displayed) Labs Reviewed  COMPREHENSIVE METABOLIC PANEL - Abnormal; Notable for the following components:      Result Value   Potassium 3.1 (*)    Glucose, Bld 134 (*)    All other components within normal limits  CBC - Abnormal; Notable for the following components:   RBC 5.39 (*)    MCV 79.6 (*)    All other components within normal limits  URINALYSIS, ROUTINE W REFLEX MICROSCOPIC - Abnormal; Notable for the following components:   Hgb urine dipstick MODERATE (*)    Leukocytes,Ua TRACE (*)    Bacteria, UA RARE (*)    All other components within normal limits  HCG, SERUM, QUALITATIVE  RAPID URINE DRUG SCREEN, HOSP PERFORMED  MAGNESIUM  TSH  T4, FREE  TROPONIN I (HIGH SENSITIVITY)  TROPONIN I (HIGH SENSITIVITY)    EKG EKG Interpretation Date/Time:  Wednesday August 10 2023 02:19:50 EDT Ventricular Rate:  118 PR Interval:  150 QRS Duration:  72 QT Interval:  312 QTC Calculation: 437 R Axis:   89  Text Interpretation: Sinus tachycardia Possible Left atrial enlargement T wave abnormality Abnormal ECG Interpretation limited secondary to artifact Confirmed by Zadie Rhine ([id number]) on 08/10/2023 2:24:42 AM  Radiology DG Chest Port 1 View  Result Date:  08/10/2023 CLINICAL DATA:  Sob Pt presents from home for palpitations, CP, HA, and chills. EDP at bedside at time of triage EXAM: PORTABLE CHEST 1 VIEW COMPARISON:  Chest x-ray 07/13/2023 FINDINGS: The heart and mediastinal contours are within normal limits. No focal consolidation. No pulmonary edema. No pleural effusion. No pneumothorax. No acute osseous abnormality. IMPRESSION: No active disease. Electronically Signed   By: Tish Frederickson M.D.   On: 08/10/2023 02:45    Procedures Procedures    Medications Ordered in ED Medications  potassium chloride SA (KLOR-CON M) CR tablet 40 mEq (40 mEq Oral Given 08/10/23 0835)  acetaminophen (TYLENOL) tablet 1,000 mg (1,000 mg Oral Given 08/10/23 0933)  ibuprofen (ADVIL) tablet 400 mg (400 mg Oral Given 08/10/23 6045)    ED Course/ Medical Decision Making/ A&P                          Medical Decision Making Amount and/or Complexity of Data Reviewed Labs: ordered. Radiology: ordered.  Risk OTC drugs. Prescription drug management.    This patient presents to the ED for concern of palpitations, this involves an extensive number of treatment options, and is a complaint that carries with it a high risk of complications and morbidity.  I considered the following differential and admission for this acute, potentially life threatening condition.   MDM:    The differential diagnosis for this patient's palpitations includes but is not limited to:  Arrhythmias including narrow and wide-complex tachycardias, AV blocks, PACs/PVCs, sick sinus syndrome.  Her EKG here do not demonstrate any arrhythmia, except she had some sinus tachycardia when she arrived which subsided without intervention.  Consider nonarrhythmic cardiac causes such as ACS, cardiomyopathy, pericarditis/myocarditis, valvular heart disease, or congestive heart failure however patient does not have any chest pain or murmur on exam.  Her chest x-ray does not demonstrate any cardiomegaly or  pulm edema/PNA and she has no leg swelling. No UTI on UA.  Consider drug effect from the caffeine in her tea.  Also considered anemia, thyroid abnormalities, electrolyte derangements, pulmonary embolism, but labs are reassuring against these.  She did have very mild hypokalemia which could be due to diet, this is repleted here, I do not believe this is the cause of her symptoms.  Consider dehydration, orthostasis, especially the patient reported dizziness.  And lastly, consider psychiatric causes such as anxiety, panic attack, as patient endorses significant stress in her life recently and feeling that that could contribute.  Patient did not have any recurrence of her symptoms in her unfortunately long wait in the waiting room.  Patient does not have a primary care physician.  I recommended the patient establish with a PCP, stay well-hydrated at home, decrease her caffeine intake, reduce the stress in her life, and follow-up with primary care physician.  I discussed with the patient she may require Holter monitoring  to evaluate for any arrhythmias.  Patient reports understanding.  Will be discharged with discharge instructions and return precautions, all questions answered to patient satisfaction.  Clinical Course as of 08/10/23 1143  Wed Aug 10, 2023  0910 CBC, troponin, FT4, TSH, Mg, HCG unremarkable. Cmp only remakable for very mild low potassium 3.1, which is repleted [HN]    Clinical Course User Index [HN] Loetta Rough, MD    Labs: I Ordered, and personally interpreted labs.  The pertinent results include: Those listed above  Imaging Studies ordered: I ordered imaging studies including chest x-ray I independently visualized and interpreted imaging. I agree with the radiologist interpretation  Additional history obtained from chart review.    Reevaluation: After the interventions noted above, I reevaluated the patient and found that they have :resolved  Social Determinants of  Health: Lives independently  Disposition:  DC w/ discharge instructions/return precautions. All questions answered to patient's satisfaction.    Co morbidities that complicate the patient evaluation  Past Medical History:  Diagnosis Date   AMA (advanced maternal age) multigravida 35+    Anemia    Anxiety    celexa   Diabetes mellitus without complication (HCC) 2016   current/last pregnancy   Gestational diabetes 01/27/2022   Hypertension      Medicines Meds ordered this encounter  Medications   potassium chloride SA (KLOR-CON M) CR tablet 40 mEq   acetaminophen (TYLENOL) tablet 1,000 mg   ibuprofen (ADVIL) tablet 400 mg    I have reviewed the patients home medicines and have made adjustments as needed  Problem List / ED Course: Problem List Items Addressed This Visit   None Visit Diagnoses     Palpitations    -  Primary   Dizziness                       This note was created using dictation software, which may contain spelling or grammatical errors.    Loetta Rough, MD 08/10/23 1146

## 2023-09-28 IMAGING — CT CT ABD-PELV W/ CM
2 of 5 series · 17 of 46 positions shown, 19 images · IV contrast (Omni 300)
Comparison: None.

CLINICAL DATA: Abdominal pain, acute, nonlocalized draining lesion
in belly button

EXAM:
CT ABDOMEN AND PELVIS WITH CONTRAST
TECHNIQUE: Multidetector CT imaging of the abdomen and pelvis was performed
using the standard protocol following bolus administration of
intravenous contrast.

[Series 3: a/p w/ 5mm · axial · 0.88mm/px · z∈[+642,+1067]mm · 14 of 95 slices shown, 16 images]
[im 5/95  soft-tissue]
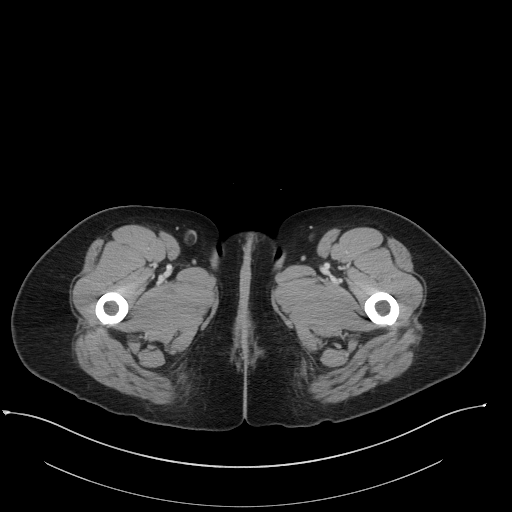
[im 5/95  bone]
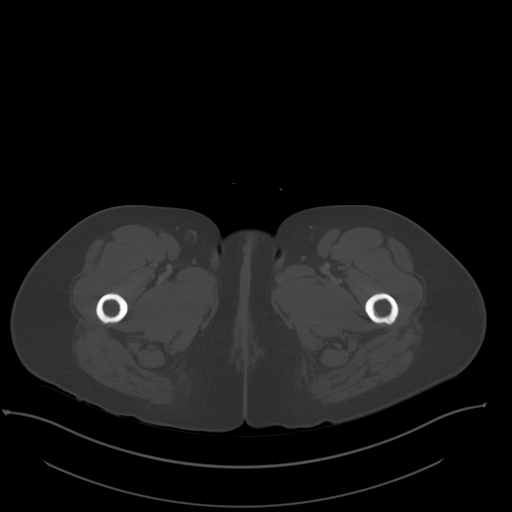
[im 15/95  soft-tissue]
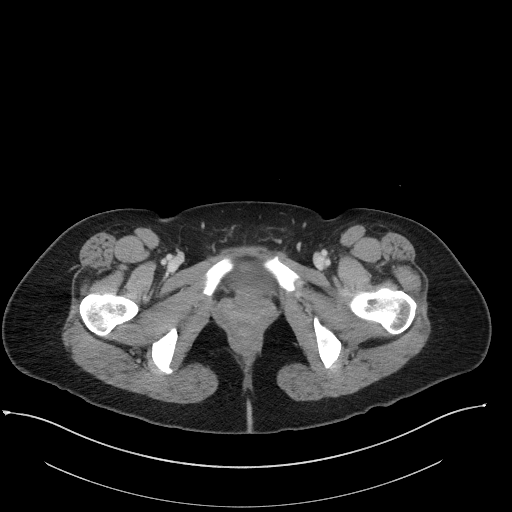
[im 19/95  soft-tissue]
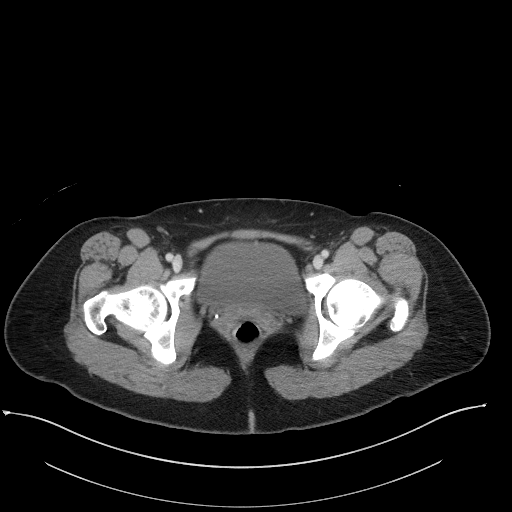
[im 24/95  soft-tissue]
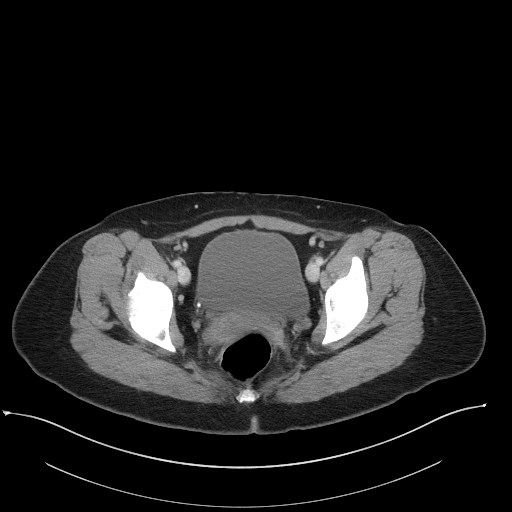
[im 33/95  soft-tissue]
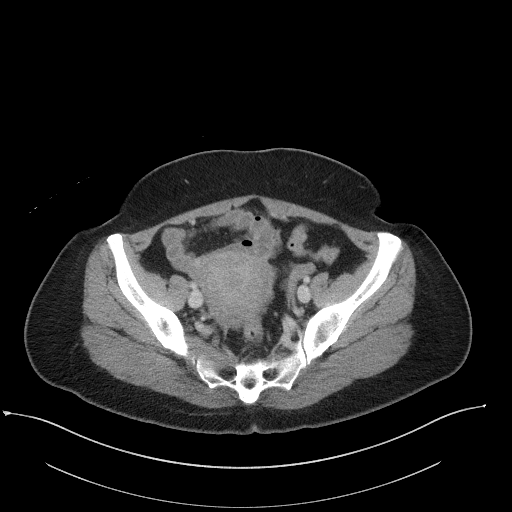
[im 38/95  soft-tissue]
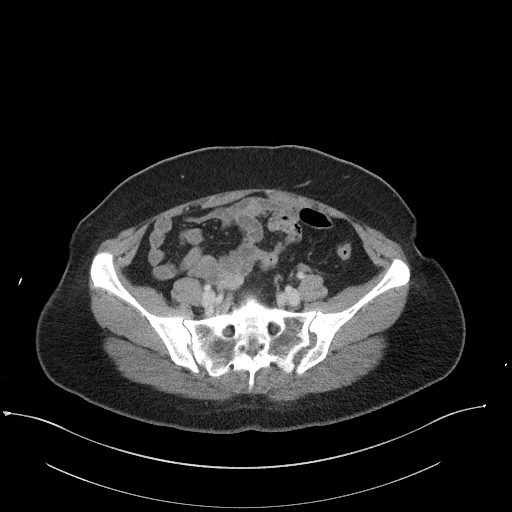
[im 43/95  soft-tissue]
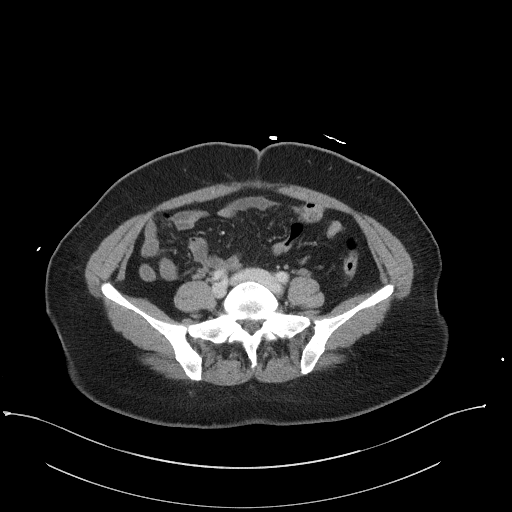
[im 52/95  soft-tissue]
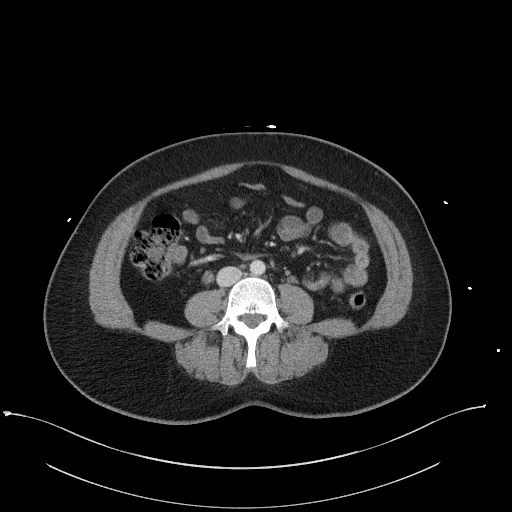
[im 57/95  soft-tissue]
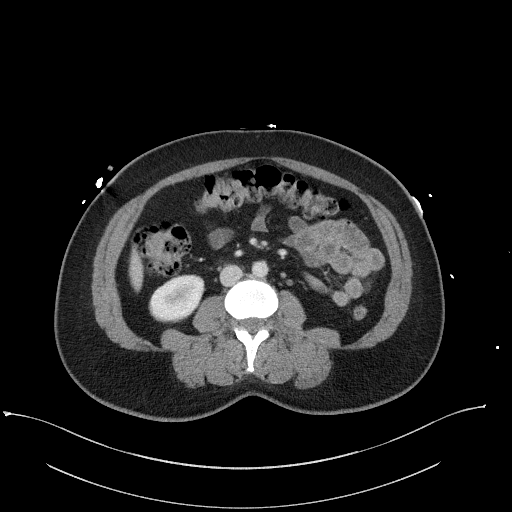
[im 57/95  bone]
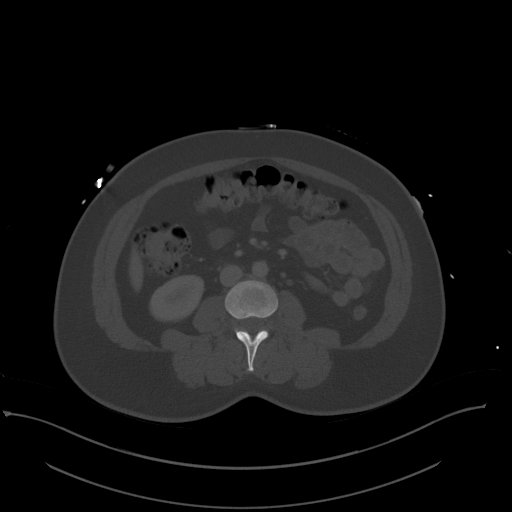
[im 62/95  soft-tissue]
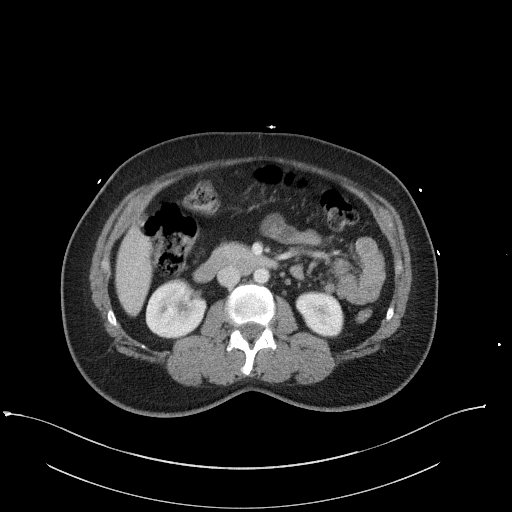
[im 71/95  soft-tissue]
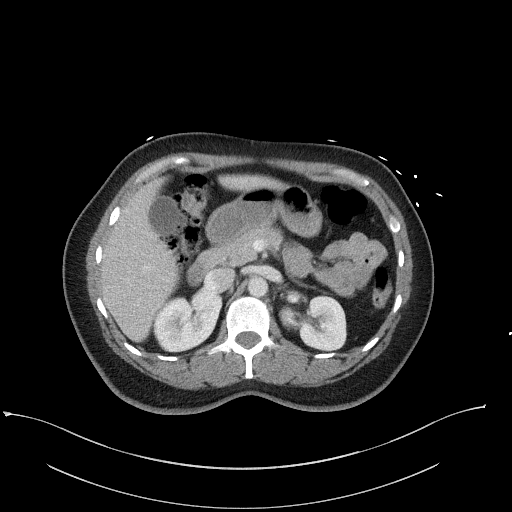
[im 76/95  soft-tissue]
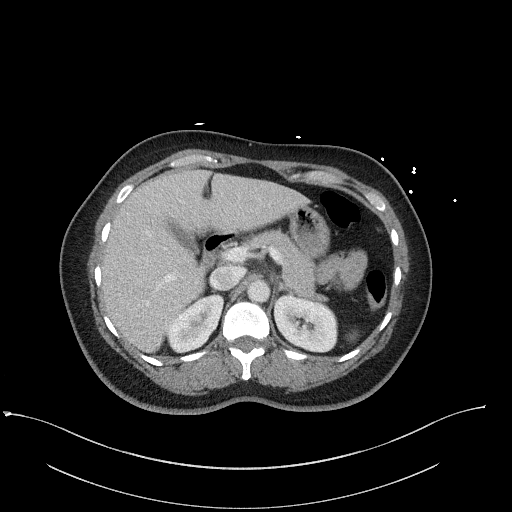
[im 80/95  soft-tissue]
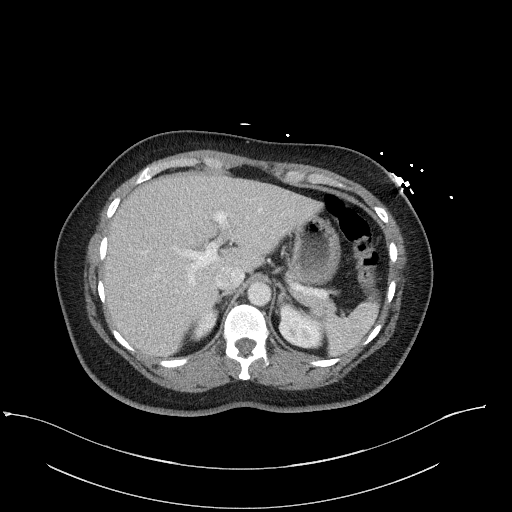
[im 90/95  soft-tissue]
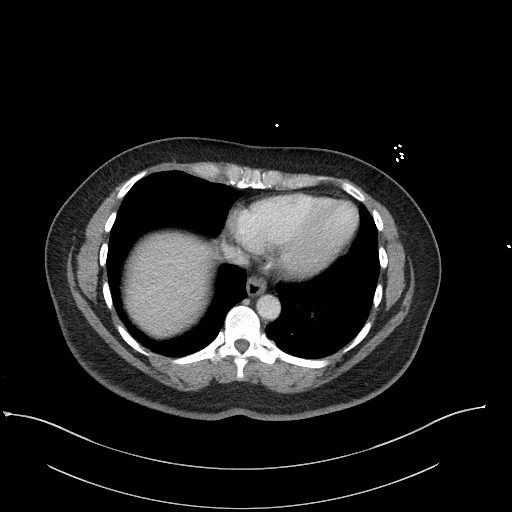

[Series 6: a/p w/ cor · coronal · 0.93mm/px · 3 of 150 slices shown]
[im 50/150  soft-tissue]
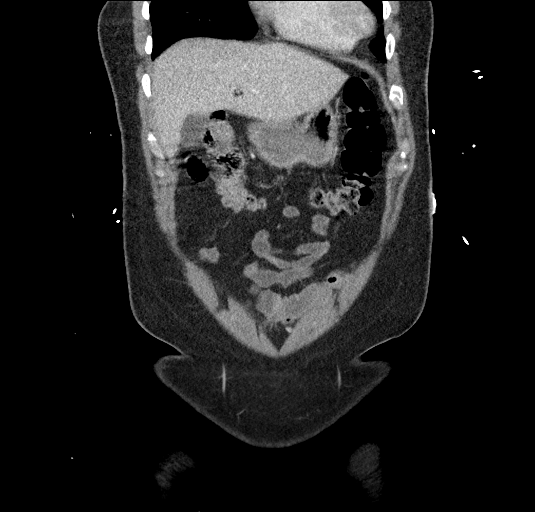
[im 67/150  soft-tissue]
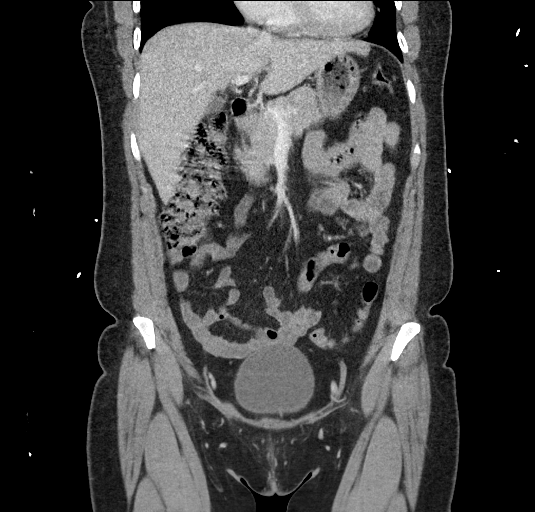
[im 83/150  soft-tissue]
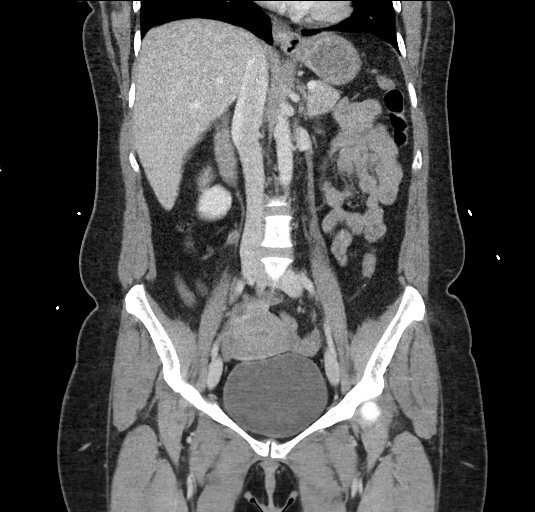

[17 of 46 positions shown; findings below may reference images not displayed]

RADIATION DOSE REDUCTION: This exam was performed according to the
departmental dose-optimization program which includes automated
exposure control, adjustment of the mA and/or kV according to
patient size and/or use of iterative reconstruction technique.

CONTRAST:  100mL OMNIPAQUE IOHEXOL 300 MG/ML  SOLN
FINDINGS: Lower chest: No acute abnormality.

Hepatobiliary: No focal hepatic abnormality. Gallbladder
unremarkable.

Pancreas: No focal abnormality or ductal dilatation.

Spleen: No focal abnormality.  Normal size.

Adrenals/Urinary Tract: No adrenal abnormality. No focal renal
abnormality. No stones or hydronephrosis. Urinary bladder is
unremarkable.

Stomach/Bowel: Stomach, large and small bowel grossly unremarkable.
Normal appendix.

Vascular/Lymphatic: No evidence of aneurysm or adenopathy.

Reproductive: Uterus and adnexa unremarkable.  No mass.

Other: No free fluid or free air. No visible abnormality in the
umbilicus region.

Musculoskeletal: No acute bony abnormality.
IMPRESSION: No acute findings in the abdomen or pelvis.

No visible fluid collection or other abnormality in the umbilicus.

## 2023-10-17 ENCOUNTER — Telehealth: Payer: Self-pay

## 2023-10-17 NOTE — Telephone Encounter (Signed)
Telephoned patient at mobile number using language line interpreter#[phone]. Left a voice message with [facility] contact information.  Mailed patient mammogram scholarship application

## 2024-10-04 ENCOUNTER — Observation Stay (HOSPITAL_COMMUNITY)
Admission: EM | Admit: 2024-10-04 | Discharge: 2024-10-06 | Disposition: A | Payer: Self-pay | Attending: Internal Medicine | Admitting: Internal Medicine

## 2024-10-04 ENCOUNTER — Other Ambulatory Visit: Payer: Self-pay

## 2024-10-04 DIAGNOSIS — R079 Chest pain, unspecified: Secondary | ICD-10-CM

## 2024-10-04 DIAGNOSIS — R29818 Other symptoms and signs involving the nervous system: Principal | ICD-10-CM | POA: Diagnosis present

## 2024-10-04 DIAGNOSIS — E1140 Type 2 diabetes mellitus with diabetic neuropathy, unspecified: Secondary | ICD-10-CM | POA: Insufficient documentation

## 2024-10-04 DIAGNOSIS — Z79899 Other long term (current) drug therapy: Secondary | ICD-10-CM | POA: Insufficient documentation

## 2024-10-04 DIAGNOSIS — K219 Gastro-esophageal reflux disease without esophagitis: Secondary | ICD-10-CM

## 2024-10-04 DIAGNOSIS — G629 Polyneuropathy, unspecified: Secondary | ICD-10-CM

## 2024-10-04 DIAGNOSIS — I10 Essential (primary) hypertension: Secondary | ICD-10-CM | POA: Insufficient documentation

## 2024-10-04 DIAGNOSIS — E876 Hypokalemia: Secondary | ICD-10-CM | POA: Insufficient documentation

## 2024-10-04 DIAGNOSIS — Z7901 Long term (current) use of anticoagulants: Secondary | ICD-10-CM | POA: Insufficient documentation

## 2024-10-04 NOTE — ED Triage Notes (Signed)
 Pt c/o chest pain (pressure), numbness from head to toe and weakness x3 days but worse today.

## 2024-10-05 ENCOUNTER — Observation Stay (HOSPITAL_COMMUNITY)

## 2024-10-05 ENCOUNTER — Emergency Department (HOSPITAL_COMMUNITY)

## 2024-10-05 ENCOUNTER — Encounter (HOSPITAL_COMMUNITY): Payer: Self-pay | Admitting: Internal Medicine

## 2024-10-05 ENCOUNTER — Other Ambulatory Visit (HOSPITAL_COMMUNITY): Payer: Self-pay | Admitting: *Deleted

## 2024-10-05 DIAGNOSIS — R29818 Other symptoms and signs involving the nervous system: Principal | ICD-10-CM

## 2024-10-05 DIAGNOSIS — R079 Chest pain, unspecified: Secondary | ICD-10-CM

## 2024-10-05 LAB — PROTIME-INR
INR: 1.1 (ref 0.8–1.2)
Prothrombin Time: 14.6 s (ref 11.4–15.2)

## 2024-10-05 LAB — HCG, SERUM, QUALITATIVE: Preg, Serum: NEGATIVE

## 2024-10-05 LAB — URINE DRUG SCREEN
Amphetamines: NEGATIVE
Barbiturates: NEGATIVE
Benzodiazepines: NEGATIVE
Cocaine: NEGATIVE
Fentanyl: NEGATIVE
Methadone Scn, Ur: NEGATIVE
Opiates: NEGATIVE
Tetrahydrocannabinol: NEGATIVE

## 2024-10-05 LAB — DIFFERENTIAL
Abs Immature Granulocytes: 0.02 K/uL (ref 0.00–0.07)
Basophils Absolute: 0 K/uL (ref 0.0–0.1)
Basophils Relative: 0 %
Eosinophils Absolute: 0.1 K/uL (ref 0.0–0.5)
Eosinophils Relative: 1 %
Immature Granulocytes: 0 %
Lymphocytes Relative: 45 %
Lymphs Abs: 3.9 K/uL (ref 0.7–4.0)
Monocytes Absolute: 0.7 K/uL (ref 0.1–1.0)
Monocytes Relative: 8 %
Neutro Abs: 3.9 K/uL (ref 1.7–7.7)
Neutrophils Relative %: 46 %

## 2024-10-05 LAB — LIPID PANEL
Cholesterol: 135 mg/dL (ref 0–200)
HDL: 45 mg/dL (ref >40)
LDL Cholesterol: 75 mg/dL (ref 0–99)
Total CHOL/HDL Ratio: 3 ratio
Triglycerides: 74 mg/dL (ref <150)
VLDL: 15 mg/dL (ref 0–40)

## 2024-10-05 LAB — GLUCOSE, CAPILLARY
Glucose-Capillary: 96 mg/dL (ref 70–99)
Glucose-Capillary: 87 mg/dL (ref 70–99)
Glucose-Capillary: 91 mg/dL (ref 70–99)
Glucose-Capillary: 109 mg/dL — ABNORMAL HIGH (ref 70–99)

## 2024-10-05 LAB — ECHOCARDIOGRAM COMPLETE
Area-P 1/2: 3.08 cm2
S' Lateral: 2.1 cm
Weight: 2640 [oz_av]

## 2024-10-05 LAB — MAGNESIUM: Magnesium: 2.1 mg/dL (ref 1.7–2.4)

## 2024-10-05 LAB — CBC
HCT: 37 % (ref 36.0–46.0)
HCT: 40.8 % (ref 36.0–46.0)
Hemoglobin: 12.4 g/dL (ref 12.0–15.0)
Hemoglobin: 13.7 g/dL (ref 12.0–15.0)
MCH: 26.9 pg (ref 26.0–34.0)
MCH: 27.3 pg (ref 26.0–34.0)
MCHC: 33.5 g/dL (ref 30.0–36.0)
MCHC: 33.6 g/dL (ref 30.0–36.0)
MCV: 80 fL (ref 80.0–100.0)
MCV: 81.3 fL (ref 80.0–100.0)
Platelets: 238 K/uL (ref 150–400)
Platelets: 277 K/uL (ref 150–400)
RBC: 4.55 MIL/uL (ref 3.87–5.11)
RBC: 5.1 MIL/uL (ref 3.87–5.11)
RDW: 13.1 % (ref 11.5–15.5)
RDW: 13.2 % (ref 11.5–15.5)
WBC: 8.4 K/uL (ref 4.0–10.5)
WBC: 8.6 K/uL (ref 4.0–10.5)
nRBC: 0 % (ref 0.0–0.2)
nRBC: 0 % (ref 0.0–0.2)

## 2024-10-05 LAB — BASIC METABOLIC PANEL WITH GFR
Anion gap: 11 (ref 5–15)
BUN: 12 mg/dL (ref 6–20)
CO2: 20 mmol/L — ABNORMAL LOW (ref 22–32)
Calcium: 8.1 mg/dL — ABNORMAL LOW (ref 8.9–10.3)
Chloride: 108 mmol/L (ref 98–111)
Creatinine, Ser: 0.63 mg/dL (ref 0.44–1.00)
GFR, Estimated: >60 mL/min (ref >60)
Glucose, Bld: 90 mg/dL (ref 70–99)
Potassium: 5 mmol/L (ref 3.5–5.1)
Sodium: 139 mmol/L (ref 135–145)

## 2024-10-05 LAB — COMPREHENSIVE METABOLIC PANEL WITH GFR
ALT: 12 U/L (ref 0–44)
AST: 19 U/L (ref 15–41)
Albumin: 4.5 g/dL (ref 3.5–5.0)
Alkaline Phosphatase: 62 U/L (ref 38–126)
Anion gap: 14 (ref 5–15)
BUN: 15 mg/dL (ref 6–20)
CO2: 21 mmol/L — ABNORMAL LOW (ref 22–32)
Calcium: 8.9 mg/dL (ref 8.9–10.3)
Chloride: 101 mmol/L (ref 98–111)
Creatinine, Ser: 0.68 mg/dL (ref 0.44–1.00)
GFR, Estimated: >60 mL/min (ref >60)
Glucose, Bld: 86 mg/dL (ref 70–99)
Potassium: 3.3 mmol/L — ABNORMAL LOW (ref 3.5–5.1)
Sodium: 137 mmol/L (ref 135–145)
Total Bilirubin: 0.9 mg/dL (ref 0.0–1.2)
Total Protein: 7.6 g/dL (ref 6.5–8.1)

## 2024-10-05 LAB — POC URINE PREG, ED: Preg Test, Ur: NEGATIVE

## 2024-10-05 LAB — PHOSPHORUS: Phosphorus: 3.5 mg/dL (ref 2.5–4.6)

## 2024-10-05 LAB — APTT: aPTT: 32 s (ref 24–36)

## 2024-10-05 LAB — TROPONIN T, HIGH SENSITIVITY
Troponin T High Sensitivity: <15 ng/L (ref 0–19)
Troponin T High Sensitivity: <15 ng/L (ref 0–19)

## 2024-10-05 LAB — LIPASE, BLOOD: Lipase: 42 U/L (ref 11–51)

## 2024-10-05 LAB — ETHANOL: Alcohol, Ethyl (B): <15 mg/dL (ref <15)

## 2024-10-05 LAB — HIV ANTIBODY (ROUTINE TESTING W REFLEX): HIV Screen 4th Generation wRfx: NONREACTIVE

## 2024-10-05 MED ORDER — PROCHLORPERAZINE EDISYLATE 10 MG/2ML IJ SOLN
10.0000 mg | Freq: Once | INTRAMUSCULAR | Status: AC
  Administered 2024-10-05: 10 mg via INTRAVENOUS
  Filled 2024-10-05: qty 2

## 2024-10-05 MED ORDER — GADOBUTROL 1 MMOL/ML IV SOLN
7.5000 mL | Freq: Once | INTRAVENOUS | Status: AC | PRN
  Administered 2024-10-05: 7.5 mL via INTRAVENOUS

## 2024-10-05 MED ORDER — POTASSIUM CHLORIDE 10 MEQ/100ML IV SOLN
10.0000 meq | INTRAVENOUS | Status: AC
  Administered 2024-10-05 (×2): 10 meq via INTRAVENOUS
  Filled 2024-10-05 (×2): qty 100

## 2024-10-05 MED ORDER — PANTOPRAZOLE SODIUM 40 MG PO TBEC
40.0000 mg | DELAYED_RELEASE_TABLET | Freq: Every day | ORAL | Status: DC
  Administered 2024-10-05 – 2024-10-06 (×2): 40 mg via ORAL
  Filled 2024-10-05 (×2): qty 1

## 2024-10-05 MED ORDER — STROKE: EARLY STAGES OF RECOVERY BOOK: Freq: Once | Status: AC

## 2024-10-05 MED ORDER — SODIUM CHLORIDE 0.9 % IV BOLUS
1000.0000 mL | Freq: Once | INTRAVENOUS | Status: AC
  Administered 2024-10-05: 1000 mL via INTRAVENOUS

## 2024-10-05 MED ORDER — LABETALOL HCL 5 MG/ML IV SOLN: 5.0000 mg | INTRAVENOUS | Status: DC | PRN

## 2024-10-05 MED ORDER — MELATONIN 3 MG PO TABS: 6.0000 mg | ORAL_TABLET | Freq: Every evening | ORAL | Status: DC | PRN

## 2024-10-05 MED ORDER — LACTATED RINGERS IV SOLN: INTRAVENOUS | Status: DC

## 2024-10-05 MED ORDER — CHLORHEXIDINE GLUCONATE CLOTH 2 % EX PADS: 6.0000 | MEDICATED_PAD | Freq: Every day | CUTANEOUS | Status: DC

## 2024-10-05 MED ORDER — INSULIN ASPART 100 UNIT/ML IJ SOLN: 0.0000 [IU] | INTRAMUSCULAR | Status: DC

## 2024-10-05 MED ORDER — PROCHLORPERAZINE EDISYLATE 10 MG/2ML IJ SOLN: 5.0000 mg | Freq: Four times a day (QID) | INTRAMUSCULAR | Status: DC | PRN

## 2024-10-05 MED ORDER — ENOXAPARIN SODIUM 40 MG/0.4ML IJ SOSY
40.0000 mg | PREFILLED_SYRINGE | INTRAMUSCULAR | Status: DC
  Administered 2024-10-05 – 2024-10-06 (×2): 40 mg via SUBCUTANEOUS
  Filled 2024-10-05 (×2): qty 0.4

## 2024-10-05 MED ORDER — DIPHENHYDRAMINE HCL 50 MG/ML IJ SOLN
25.0000 mg | Freq: Once | INTRAMUSCULAR | Status: AC
  Administered 2024-10-05: 25 mg via INTRAVENOUS
  Filled 2024-10-05: qty 1

## 2024-10-05 MED ORDER — ACETAMINOPHEN 325 MG PO TABS
650.0000 mg | ORAL_TABLET | Freq: Four times a day (QID) | ORAL | Status: DC | PRN
  Administered 2024-10-05: 650 mg via ORAL
  Filled 2024-10-05: qty 2

## 2024-10-05 MED ORDER — SODIUM CHLORIDE 0.9 % IV SOLN: INTRAVENOUS | Status: AC

## 2024-10-05 MED ORDER — POLYETHYLENE GLYCOL 3350 17 G PO PACK: 17.0000 g | PACK | Freq: Every day | ORAL | Status: DC | PRN

## 2024-10-05 MED ORDER — IOHEXOL 350 MG/ML SOLN
150.0000 mL | Freq: Once | INTRAVENOUS | Status: AC | PRN
  Administered 2024-10-05: 150 mL via INTRAVENOUS

## 2024-10-05 NOTE — Progress Notes (Signed)
 Patient seen and examined; admitted after midnight secondary to left-sided upper extremity weakness and associated facial numbness.  Patient reports symptom has been present for couple of days prior to admission and worsening.  No chest pain, no nausea, no vomiting, no shortness of breath, no blurry vision, no sick contacts or any other complaints.  Please refer to H&P written by Dr. Shona for further info/details on admission.  Plan: -Complete stroke workup - Follow recommendations by PT/OT and speech therapy; so far no neurologic deficits appreciated on examination. -Anticipating discharge home before midday tomorrow.  Eric Nunnery MD [phone]

## 2024-10-05 NOTE — TOC CM/SW Note (Signed)
 Transition of Care [facility]) - Inpatient Brief Assessment   Patient Details  Name: Kathleen Tucker MRN: [id number] Date of Birth: 16-Jan-1976  Transition of Care [facility]) CM/SW Contact:    Lucie Lunger, LCSWA Phone Number: 10/05/2024, 10:50 AM   Clinical Narrative: Transition of Care Department [facility]) has reviewed patient and no TOC needs have been identified at this time. We will continue to monitor patient advancement through interdiciplinary progression rounds. If new patient transition needs arise, please place a TOC consult.  Transition of Care Asessment: Insurance and Status: Selfpay Patient has primary care physician: No (PCP list added to AVS) Home environment has been reviewed: From home Prior level of function:: Independent Prior/Current Home Services: No current home services Social Drivers of Health Review: SDOH reviewed no interventions necessary Readmission risk has been reviewed: Yes Transition of care needs: no transition of care needs at this time

## 2024-10-05 NOTE — Progress Notes (Signed)
 SLP Cancellation Note  Patient Details Name: Kathleen Tucker MRN: [id number] DOB: Apr 20, 1976   Cancelled treatment:       Reason Eval/Treat Not Completed: Patient at procedure or test/unavailable. Pt OTF for MRI. Will continue efforts, thank you  Assad Harbeson H. Clois KILLIAN, CCC-SLP Speech Language Pathologist    Raguel VEAR Clois 10/05/2024, 1:26 PM

## 2024-10-05 NOTE — ED Provider Notes (Signed)
 [facility]-EMERGENCY DEPT [facility] Emergency Department Provider Note MRN:  [id number]  Arrival date & time: 10/05/24     Chief Complaint   Chest Pain   History of Present Illness   Lennette Fader is a 48 y.o. year-old female with a history of hypertension, diabetes presenting to the ED with chief complaint of chest pain.  Chest pain today with radiation to the left upper neck, left thoracic back, left shoulder.  Also having numbness to the left side of the face, weakness to the left leg.  Started at 6 PM.  Feels generally unwell.  Review of Systems  A thorough review of systems was obtained and all systems are negative except as noted in the HPI and PMH.   Patient's Health History    Past Medical History:  Diagnosis Date   AMA (advanced maternal age) multigravida 35+    Anemia    Anxiety    celexa   Diabetes mellitus without complication (HCC) 2016   current/last pregnancy   Gestational diabetes 01/27/2022   Hypertension     No past surgical history on file.  Family History  Problem Relation Age of Onset   Diabetes Mother    Diabetes Father    Asthma Neg Hx    Cancer Neg Hx    Heart disease Neg Hx    Hypertension Neg Hx    Stroke Neg Hx    Breast cancer Neg Hx     Social History   Socioeconomic History   Marital status: Significant Other    Spouse name: Steffan   Number of children: 5   Years of education: Not on file   Highest education level: 6th grade  Occupational History   Not on file  Tobacco Use   Smoking status: Never   Smokeless tobacco: Never  Vaping Use   Vaping status: Never Used  Substance and Sexual Activity   Alcohol use: No   Drug use: No   Sexual activity: Yes    Comment: desires Nexplanon - has appt @ [facility] 04/23/15  Other Topics Concern   Not on file  Social History Narrative   ** Merged History Encounter **       Social Drivers of Health   Financial Resource Strain: Not on file  Food Insecurity: No Food Insecurity  (11/04/2022)   Hunger Vital Sign    Worried About Running Out of Food in the Last Year: Never true    Ran Out of Food in the Last Year: Never true  Transportation Needs: No Transportation Needs (11/04/2022)   PRAPARE - Administrator, Civil Service (Medical): No    Lack of Transportation (Non-Medical): No  Physical Activity: Not on file  Stress: Not on file  Social Connections: Not on file  Intimate Partner Violence: Not At Risk (08/13/2022)   Humiliation, Afraid, Rape, and Kick questionnaire    Fear of Current or Ex-Partner: No    Emotionally Abused: No    Physically Abused: No    Sexually Abused: No     Physical Exam   Vitals:   10/05/24 0100 10/05/24 0130  BP: 125/64 138/75  Pulse: 75 81  Resp: 14 15  Temp:    SpO2: 97% 96%    CONSTITUTIONAL: Well-appearing, looks uncomfortable NEURO/PSYCH:  Alert and oriented x 3, subjective decrease sensation to the left side of the face, decreased strength of the left leg EYES:  eyes equal and reactive ENT/NECK:  no LAD, no JVD CARDIO: Regular rate, well-perfused, normal S1  and S2 PULM:  CTAB no wheezing or rhonchi GI/GU:  non-distended, non-tender MSK/SPINE:  No gross deformities, no edema SKIN:  no rash, atraumatic   *Additional and/or pertinent findings included in MDM below  Diagnostic and Interventional Summary    EKG Interpretation Date/Time:  Thursday October 04 2024 23:41:31 EST Ventricular Rate:  101 PR Interval:  164 QRS Duration:  85 QT Interval:  348 QTC Calculation: 452 R Axis:   77  Text Interpretation: Sinus tachycardia Borderline repolarization abnormality Baseline wander in lead(s) V2 Confirmed by Theadore Sharper [phone]) on 10/05/2024 1:10:20 AM       Labs Reviewed  COMPREHENSIVE METABOLIC PANEL WITH GFR - Abnormal; Notable for the following components:      Result Value   Potassium 3.3 (*)    CO2 21 (*)    All other components within normal limits  CBC  LIPASE, BLOOD  PROTIME-INR  APTT   DIFFERENTIAL  ETHANOL  HCG, SERUM, QUALITATIVE  URINE DRUG SCREEN  CBG MONITORING, ED  POC URINE PREG, ED  TROPONIN T, HIGH SENSITIVITY  TROPONIN T, HIGH SENSITIVITY    DG Chest Port 1 View  Final Result    CT ANGIO HEAD NECK W WO CM  Final Result    CT Angio Chest/Abd/Pel for Dissection W and/or Wo Contrast  Final Result    MR Brain W and Wo Contrast    (Results Pending)  MR MRV HEAD WO CM    (Results Pending)  MR Cervical Spine W and Wo Contrast    (Results Pending)    Medications  diphenhydrAMINE  (BENADRYL ) injection 25 mg (has no administration in time range)  prochlorperazine  (COMPAZINE ) injection 10 mg (has no administration in time range)  sodium chloride  0.9 % bolus 1,000 mL (has no administration in time range)  iohexol  (OMNIPAQUE ) 350 MG/ML injection 150 mL (150 mLs Intravenous Contrast Given 10/05/24 0025)     Procedures  /  Critical Care Procedures  ED Course and Medical Decision Making  Initial Impression and Ddx Differential diagnosis includes aortic dissection, acute ischemic stroke, ACS, PE.  Patient sent rapidly to CT imaging.  Given the onset of symptoms 6 hours ago and patient is not exhibiting any visual field cuts, no aphasia, no neglect, this is not a code stroke initiation.  Outside of the window for tPA.  Past medical/surgical history that increases complexity of ED encounter: Hypertension, diabetes  Interpretation of Diagnostics I personally reviewed the EKG and my interpretation is as follows: Sinus rhythm with nonspecific findings  No significant blood count or electrolyte disturbance.  Patient Reassessment and Ultimate Disposition/Management     CTA imaging is unremarkable.  Patient evaluated by teleneurology, recommendations include MRI imaging, migraine cocktail, admission.  Patient management required discussion with the following services or consulting groups:  Hospitalist Service  Complexity of Problems Addressed Acute illness or  injury that poses threat of life of bodily function  Additional Data Reviewed and Analyzed Further history obtained from: Further history from spouse/family member  Additional Factors Impacting ED Encounter Risk Consideration of hospitalization  Sharper HERO. Theadore, MD [facility] Emergency Medicine [facility] [email or web address]  Final Clinical Impressions(s) / ED Diagnoses     ICD-10-CM   1. Neurological deficit present  R29.818     2. Chest pain, unspecified type  R07.9       ED Discharge Orders     None        Discharge Instructions Discussed with and Provided to Patient:   Discharge Instructions  None      Theadore Ozell HERO, MD 10/05/24 GARLON

## 2024-10-05 NOTE — H&P (Addendum)
 History and Physical  [name]:[id number] DOB: 06/02/1976 DOA: 10/04/2024  Referring physician: Dr. Theadore, EDP  PCP: Pcp, No  Outpatient Specialists: None Patient coming from: Home  Video interpreter [place] [phone].  Chief Complaint: Left sided weakness, chest pain.   HPI: Kathleen Tucker is a 48 y.o. female with medical history significant for hypertension, type 2 diabetes, who presents to the ER with complaints of left-sided weakness and chest pain.  Chest pain is left-sided, sharp, and radiates to the upper part of her left neck.  Also endorses numbness to the left side of her face, left-sided weakness, numbness, and headache since around 6 PM.  In the ER, twelve-lead EKG with no evidence of acute ischemia.  High-sensitivity troponin negative x 2.  Noncontrast head CT and CT angio head and neck were nonacute.  Seen by teleneuro.  Recommended MRI brain with and without contrast, MRI of cervical spine with and without contrast and MRV head.  For the patient's headache, administer migraine cocktail, IV Compazine  10 mg, IV Benadryl  25 mg, IV magnesium  2 g.  At the time of this visit patient was alert and oriented x 3.  Her symptoms of numbness persist.  Rule out acute stroke versus demyelinating disease, versus migraine with aura.  Admitted by [facility], hospitalist service.  ED Course: Temperature 98.  BP 143/70, pulse 84, respiration 18, O2 saturation 99% on room air.  Review of Systems: Review of systems as noted in the HPI. All other systems reviewed and are negative.   Past Medical History:  Diagnosis Date   AMA (advanced maternal age) multigravida 35+    Anemia    Anxiety    celexa   Diabetes mellitus without complication (HCC) 2016   current/last pregnancy   Gestational diabetes 01/27/2022   Hypertension    No past surgical history on file.  Social History:  reports that she has never smoked. She has never used smokeless tobacco. She reports that she does not  drink alcohol and does not use drugs.   No Known Allergies  Family History  Problem Relation Age of Onset   Diabetes Mother    Diabetes Father    Asthma Neg Hx    Cancer Neg Hx    Heart disease Neg Hx    Hypertension Neg Hx    Stroke Neg Hx    Breast cancer Neg Hx       Prior to Admission medications   Medication Sig Start Date End Date Taking? Authorizing Provider  cephALEXin  (KEFLEX ) 500 MG capsule Take 1 capsule (500 mg total) by mouth 4 (four) times daily. 07/13/23   Nivia Colon, PA-C  labetalol  (NORMODYNE ) 200 MG tablet Take 1 tablet (200 mg total) by mouth 2 (two) times daily. 09/01/22   Ajewole, Christana, MD  prenatal vitamin w/FE, FA (PRENATAL 1 + 1) 27-1 MG TABS tablet Take 1 tablet by mouth daily at 12 noon.    [provider]    Physical Exam: BP 138/75   Pulse 81   Temp 98 F (36.7 C) (Oral)   Resp 15   Wt 74.8 kg   SpO2 96%   BMI 27.46 kg/m   General: 48 y.o. year-old female well developed well nourished in no acute distress.  Alert and oriented x3. Cardiovascular: Regular rate and rhythm with no rubs or gallops.  No thyromegaly or JVD noted.  No lower extremity edema. 2/4 pulses in all 4 extremities. Respiratory: Clear to auscultation with no wheezes or rales. Good inspiratory effort. Abdomen:  Soft nontender nondistended with normal bowel sounds x4 quadrants. Muskuloskeletal: No cyanosis, clubbing or edema noted bilaterally Neuro: CN II-XII intact, strength, reflexes.  Left-sided numbness. Skin: No ulcerative lesions noted or rashes Psychiatry: Judgement and insight appear normal. Mood is appropriate for condition and setting          Labs on Admission:  Basic Metabolic Panel: Recent Labs  Lab 10/05/24 0002  NA 137  K 3.3*  CL 101  CO2 21*  GLUCOSE 86  BUN 15  CREATININE 0.68  CALCIUM 8.9   Liver Function Tests: Recent Labs  Lab 10/05/24 0002  AST 19  ALT 12  ALKPHOS 62  BILITOT 0.9  PROT 7.6  ALBUMIN 4.5   Recent Labs   Lab 10/05/24 0002  LIPASE 42   No results for input(s): AMMONIA in the last 168 hours. CBC: Recent Labs  Lab 10/05/24 0002  WBC 8.6  NEUTROABS 3.9  HGB 13.7  HCT 40.8  MCV 80.0  PLT 277   Cardiac Enzymes: No results for input(s): CKTOTAL, CKMB, CKMBINDEX, TROPONINI in the last 168 hours.  BNP (last 3 results) No results for input(s): BNP in the last 8760 hours.  ProBNP (last 3 results) No results for input(s): PROBNP in the last 8760 hours.  CBG: No results for input(s): GLUCAP in the last 168 hours.  Radiological Exams on Admission: DG Chest Port 1 View Result Date: 10/05/2024 EXAM: 1 VIEW(S) XRAY OF THE CHEST 10/05/2024 12:43:00 AM COMPARISON: 08/10/2023 CLINICAL HISTORY: cp FINDINGS: LUNGS AND PLEURA: No focal pulmonary opacity. No pleural effusion. No pneumothorax. HEART AND MEDIASTINUM: No acute abnormality of the cardiac and mediastinal silhouettes. BONES AND SOFT TISSUES: No acute osseous abnormality. IMPRESSION: 1. No acute cardiopulmonary process identified. Electronically signed by: Dorethia Molt MD 10/05/2024 12:46 AM EST RP Workstation: [id number]   CT ANGIO HEAD NECK W WO CM Result Date: 10/05/2024 EXAM: CTA HEAD AND NECK WITHOUT AND WITH 10/05/2024 12:25:44 AM TECHNIQUE: CTA of the head and neck was performed with the administration of 150 mL of iohexol  (OMNIPAQUE ) 350 MG/ML injection. CT of the head was performed prior to the administration of intravenous contrast. Multiplanar 2D and/or 3D reformatted images are provided for review. Automated exposure control, iterative reconstruction, and/or weight based adjustment of the mA/kV was utilized to reduce the radiation dose to as low as reasonably achievable. Stenosis of the internal carotid arteries measured using NASCET criteria. COMPARISON: None available CLINICAL HISTORY: Neuro deficit, acute, stroke suspected. FINDINGS: CT BRAIN: Brain and ventricles: No mass, hemorrhage, or extra-axial collection.  No mass effect or midline shift. No hydrocephalus. Orbits: No acute abnormality. Sinuses: No acute abnormality. CTA NECK: AORTIC ARCH AND ARCH VESSELS: No dissection or arterial injury. No significant stenosis of the brachiocephalic or subclavian arteries. CERVICAL CAROTID ARTERIES: No dissection, arterial injury, or hemodynamically significant stenosis by NASCET criteria. CERVICAL VERTEBRAL ARTERIES: No dissection, arterial injury, or significant stenosis. LUNGS AND MEDIASTINUM: Unremarkable. SOFT TISSUES: No acute abnormality. BONES: No acute abnormality. CTA HEAD: ANTERIOR CIRCULATION: No significant stenosis of the internal carotid arteries. No significant stenosis of the anterior cerebral arteries. No significant stenosis of the middle cerebral arteries. No aneurysm. POSTERIOR CIRCULATION: No significant stenosis of the posterior cerebral arteries. No significant stenosis of the basilar artery. No significant stenosis of the vertebral arteries. No aneurysm. OTHER: No dural venous sinus thrombosis on this non-dedicated study. IMPRESSION: 1. No large vessel occlusion, hemodynamically significant stenosis, or aneurysm in the head or neck. Electronically signed by: Franky Stanford MD 10/05/2024 12:45 AM  EST RP Workstation: [id number]   CT Angio Chest/Abd/Pel for Dissection W and/or Wo Contrast Result Date: 10/05/2024 EXAM: CTA CHEST, ABDOMEN AND PELVIS WITHOUT AND WITH CONTRAST 10/05/2024 12:25:44 AM TECHNIQUE: CTA of the chest was performed without and with the administration of 150 mL of intravenous iohexol  (OMNIPAQUE ) 350 MG/ML injection. CTA of the abdomen and pelvis was performed with the administration of 150 mL of intravenous iohexol  (OMNIPAQUE ) 350 MG/ML injection. Multiplanar reformatted images are provided for review. MIP images are provided for review. Automated exposure control, iterative reconstruction, and/or weight based adjustment of the mA/kV was utilized to reduce the radiation dose to as low as  reasonably achievable. COMPARISON: CT 07/13/2023 and CT 07/11/2023. CLINICAL HISTORY: Acute aortic syndrome (AAS) suspected. FINDINGS: VASCULATURE: AORTA: No acute finding. No abdominal aortic aneurysm. No dissection. PULMONARY ARTERIES: No pulmonary embolism with the limits of this exam. GREAT VESSELS OF AORTIC ARCH: No acute finding. No dissection. No arterial occlusion or significant stenosis. CELIAC TRUNK: No acute finding. No occlusion or significant stenosis. SUPERIOR MESENTERIC ARTERY: No acute finding. No occlusion or significant stenosis. INFERIOR MESENTERIC ARTERY: No acute finding. No occlusion or significant stenosis. RENAL ARTERIES: No acute finding. No occlusion or significant stenosis. ILIAC ARTERIES: No acute finding. No occlusion or significant stenosis. CHEST: MEDIASTINUM: No mediastinal lymphadenopathy. The heart and pericardium demonstrate no acute abnormality. LUNGS AND PLEURA: The lungs are without acute process. No focal consolidation or pulmonary edema. No evidence of pleural effusion or pneumothorax. THORACIC BONES AND SOFT TISSUES: No acute bone or soft tissue abnormality. ABDOMEN AND PELVIS: LIVER: The liver is unremarkable. GALLBLADDER AND BILE DUCTS: Gallbladder is unremarkable. No biliary ductal dilatation. SPLEEN: The spleen is unremarkable. PANCREAS: The pancreas is unremarkable. ADRENAL GLANDS: Bilateral adrenal glands demonstrate no acute abnormality. KIDNEYS, URETERS AND BLADDER: No stones in the kidneys or ureters. No hydronephrosis. No perinephric or periureteral stranding. Urinary bladder is unremarkable. GI AND BOWEL: Stomach and duodenal sweep demonstrate no acute abnormality. There is no bowel obstruction. No abnormal bowel wall thickening or distension. REPRODUCTIVE: Reproductive organs are unremarkable. PERITONEUM AND RETROPERITONEUM: No ascites or free air. LYMPH NODES: No lymphadenopathy. ABDOMINAL BONES AND SOFT TISSUES: No acute abnormality of the bones. No acute soft  tissue abnormality. IMPRESSION: 1. No acute aortic syndrome. Electronically signed by: Norman Gatlin MD 10/05/2024 12:32 AM EST RP Workstation: [id number]    EKG: I independently viewed the EKG done and my findings are as followed: Sinus rhythm rate of 72.  QTc 472.  Assessment/Plan Present on Admission:  Neurological deficit present  Principal Problem:   Neurological deficit present  Neurological deficit present, with concern for possible acute CVA versus demyelinating disease, versus migraine with aura. Follow MRI brain with and without contrast, MRI of cervical spine with and without contrast and MRV head.   Neurology consulted and following. Stroke workup in place. Permissive hypertension, treat SBP greater than 220 or DBP greater than 180. IV labetalol  as needed with parameters. Frequent neurochecks PT/OT/speech therapy evaluation Fall/aspiration precautions. Follow transthoracic echocardiogram Follow fasting lipid panel, A1c. Goal LDL less than 70 Goal A1c less than 7.0 N.p.o. until passes bedside swallow evaluation.  Atypical chest pain No evidence of acute ischemia on twelve-lead EKG High-sensitivity troponin negative x 2 Defer antiplatelets to neurology when can swallow safely. Follow transthoracic echocardiogram. Closely monitor on telemetry.  Type 2 diabetes, well-controlled Last hemoglobin A1c 5.8 in 2023 Update hemoglobin A1c Diet controlled after passes her swallow evaluation.  Hypertension Ongoing permissive hypertension If no stroke is seen on MRI,  resume oral antihypertensives. Closely monitor vital signs.  Hypokalemia Serum potassium 3.4 Repleted intravenously Check magnesium  level Repeat BMP in the morning.   Time: 75 minutes.   DVT prophylaxis: Subcu Lovenox  daily.  Code Status: Full code.  Family Communication: None at bedside.  Disposition Plan: Admitted to telemetry unit.  Consults called: Teleneurology.  Admission status:  Observation status.   Status is: Observation    Terry LOISE Hurst MD [facility] Pager [phone]  If 7PM-7AM, please contact night-coverage [email or web address] Password [email or web address]  10/05/2024, 2:46 AM

## 2024-10-05 NOTE — ED Notes (Signed)
 MD Shona endorsed that she would keep the NS fluids at this time and will change her orders with the LR fluids.

## 2024-10-05 NOTE — Evaluation (Signed)
 Physical Therapy Evaluation Patient Details Name: Kathleen Tucker MRN: [id number] DOB: [birth year] Today's Date: 10/05/2024  History of Present Illness  Kathleen Tucker is a 48 y.o. female with medical history significant for hypertension, type 2 diabetes, who presents to the ER with complaints of left-sided weakness and chest pain.  Chest pain is left-sided, sharp, and radiates to the upper part of her left neck.  Also endorses numbness to the left side of her face, left-sided weakness, numbness, and headache since around 6 PM.     In the ER, twelve-lead EKG with no evidence of acute ischemia.  High-sensitivity troponin negative x 2.  Noncontrast head CT and CT angio head and neck were nonacute.  Seen by teleneuro.  Recommended MRI brain with and without contrast, MRI of cervical spine with and without contrast and MRV head.  For the patient's headache, administer migraine cocktail, IV Compazine  10 mg, IV Benadryl  25 mg, IV magnesium  2 g.   Clinical Impression  Patient functioning at baseline for functional mobility and gait and demonstrates good return for ambulating in room, hallways without loss of balance or need for an AD. Plan:  Patient discharged from physical therapy to care of nursing for ambulation daily as tolerated for length of stay.          If plan is discharge home, recommend the following:     Can travel by private vehicle        Equipment Recommendations None recommended by PT  Recommendations for Other Services       Functional Status Assessment Patient has not had a recent decline in their functional status     Precautions / Restrictions Precautions Precautions: None Recall of Precautions/Restrictions: Intact Restrictions Weight Bearing Restrictions Per Provider Order: No      Mobility  Bed Mobility Overal bed mobility: Independent                  Transfers Overall transfer level: Independent                       Ambulation/Gait Ambulation/Gait assistance: Independent Gait Distance (Feet): 150 Feet Assistive device: None Gait Pattern/deviations: WFL(Within Functional Limits) Gait velocity: normal     General Gait Details: grossly WFL with good return for ambulating in room, hallways without loss of balance or need for an AD  Stairs            Wheelchair Mobility     Tilt Bed    Modified Rankin (Stroke Patients Only)       Balance Overall balance assessment: Independent                                           Pertinent Vitals/Pain Pain Assessment Pain Assessment: No/denies pain    Home Living Family/patient expects to be discharged to:: Private residence Living Arrangements: Children Available Help at Discharge: Family;Available 24 hours/day Type of Home: House Home Access: Level entry       Home Layout: One level Home Equipment: None      Prior Function Prior Level of Function : Independent/Modified Independent;Driving             Mobility Comments: Community ambulator without AD, drives ADLs Comments: Independent     Extremity/Trunk Assessment   Upper Extremity Assessment Upper Extremity Assessment: Defer to OT evaluation LUE Deficits / Details: Decreased sensation in L shoudler.  LUE Sensation: decreased light touch    Lower Extremity Assessment Lower Extremity Assessment: Overall WFL for tasks assessed    Cervical / Trunk Assessment Cervical / Trunk Assessment: Normal  Communication   Communication Communication: No apparent difficulties    Cognition Arousal: Alert Behavior During Therapy: WFL for tasks assessed/performed                             Following commands: Intact       Cueing Cueing Techniques: Verbal cues     General Comments      Exercises     Assessment/Plan    PT Assessment Patient does not need any further PT services  PT Problem List         PT Treatment Interventions       PT Goals (Current goals can be found in the Care Plan section)  Acute Rehab PT Goals Patient Stated Goal: return home with family to assist PT Goal Formulation: With patient Time For Goal Achievement: 10/05/24 Potential to Achieve Goals: Good    Frequency       Co-evaluation PT/OT/SLP Co-Evaluation/Treatment: Yes Reason for Co-Treatment: To address functional/ADL transfers PT goals addressed during session: Mobility/safety with mobility;Balance OT goals addressed during session: ADL's and self-care       AM-PAC PT 6 Clicks Mobility  Outcome Measure Help needed turning from your back to your side while in a flat bed without using bedrails?: None Help needed moving from lying on your back to sitting on the side of a flat bed without using bedrails?: None Help needed moving to and from a bed to a chair (including a wheelchair)?: None Help needed standing up from a chair using your arms (e.g., wheelchair or bedside chair)?: None Help needed to walk in hospital room?: None Help needed climbing 3-5 steps with a railing? : None 6 Click Score: 24    End of Session   Activity Tolerance: Patient tolerated treatment well Patient left: in bed;with call bell/phone within reach Nurse Communication: Mobility status PT Visit Diagnosis: Unsteadiness on feet (R26.81);Other abnormalities of gait and mobility (R26.89);Muscle weakness (generalized) (M62.81)    Time: 9198-9175 PT Time Calculation (min) (ACUTE ONLY): 23 min   Charges:   PT Evaluation $PT Eval Moderate Complexity: 1 Mod PT Treatments $Therapeutic Activity: 23-37 mins PT General Charges $$ ACUTE PT VISIT: 1 Visit         1:47 PM, 10/05/24 Lynwood Music, MPT Physical Therapist with [facility] 336 [phone] office [phone] mobile phone

## 2024-10-05 NOTE — Consult Note (Signed)
 [facility] Consult Services  Stat Consult  Patient Name:   Kathleen Tucker, Kathleen Tucker Date of Birth:   05-19-1976 Identification Number:   MRN - [id number] Date of Service:   10/05/2024 01:31:01  Diagnosis:       R44.8 - Other and unspecified symptoms and signs involving general sensations and perceptions  Impression 48 yo F who presents with left sided weakness and numbness, headache in setting of chest and back pain. CT head personally reviewed and does not show hemorrhage or definitive acute developing ischemic stroke. CTA head/neck without LVO on my read or radiology report. Has had symptoms for several days, weakness/speech/vision issues more today though. Regardless is outside window for thrombolytics and no LVO on CTA. Would admit for further workup including MRI brain and cervical spine w/ and w/o contrast (stroke a possibility, demyelinating disease also on differential). Would give migraine cocktail as well (migraine with aura a possibility). Full recommendations as follows:  Recommendations: -admit with frequent neuro checks/vitals during admission, q4h -Goal blood pressure 110-180 for now -Euglycemia and Avoid Hyperthermia (PRN Acetaminophen ) -obtain MRI brain w/ and w/o contrast   --if + for acute stroke = will need full stroke workup (including lipid panel, A1c, TTE, antiplatelets/statin, etc) -obtain MRI cervical spine w/ and w/o contrast -obtain MRV head -trial migraine cocktail as follows:   --IV Compazine  10 mg   --IV benadryl  25 mg   --IV magnesium  2 g   --IVF bolus per ED physician -dvt ppx per primary team -PT/OT/ST consults - inpatient rehab if recommended -Please notify neurology immediately for change in exam   Dispositions : Neurology will follow    ----------------------------------------------------------------------------------------------------   Advanced Imaging: CTA Head and Neck Completed.  LVO:No  Patient is  not a candidate for NIR    Metrics: Callback Response Time: 10/05/2024 01:32:42  Primary Provider Notified of Diagnostic Impression and Management Plan on: 10/05/2024 02:07:00   CT HEAD: CT head unremarkable for acute infarction or hemorrhage per Radiology: CT head personally reviewed and does not show hemorrhage or definitive acute developing ischemic stroke. CTA head/neck without LVO on my read or radiology report.   Imaging CTA chest/abd/pelvis dissection protocol read as negative by radiology   ----------------------------------------------------------------------------------------------------  Chief Complaint: left sided weakness and numbness  History of Present Illness: Patient is a 48 year old Female. 48 yo F who presents with left sided weakness and numbness, headache in setting of chest and back pain.  Patient states she came to hospital due to pain in chest and back. Also with left sided numbness. Numbness has been there for 3 days but worse today. Also feels some weakness on left side of face/arm/leg but worse in face. Weakness has just started today however. Speech feels a little different today as well. Has a little blurry vision as well. Has constant head pain and neck pain on left side. Has a little dizziness as well.    Past Medical History:      Hypertension      There is no history of Diabetes Mellitus      There is no history of Hyperlipidemia      There is no history of Stroke  Medications:  No Anticoagulant use  No Antiplatelet use Reviewed EMR for current medications  Allergies:  NKDA  Social History: Smoking: No Alcohol Use: No  Family History:  There is no family history of premature cerebrovascular disease pertinent to this consultation  ROS : 14 Points Review of Systems was performed and was  negative except mentioned in HPI.      Examination: BP(135/80), Pulse(71), Blood Glucose(86) 1A: Level of Consciousness - Alert; keenly  responsive + 0 1B: Ask Month and Age - Both Questions Right + 0 1C: Blink Eyes & Squeeze Hands - Performs Both Tasks + 0 2: Test Horizontal Extraocular Movements - Normal + 0 3: Test Visual Fields - No Visual Loss + 0 4: Test Facial Palsy (Use Grimace if Obtunded) - Normal symmetry + 0 5A: Test Left Arm Motor Drift - No Drift for 10 Seconds + 0 5B: Test Right Arm Motor Drift - No Drift for 10 Seconds + 0 6A: Test Left Leg Motor Drift - Drift, but doesn't hit bed + 1 6B: Test Right Leg Motor Drift - No Drift for 5 Seconds + 0 7: Test Limb Ataxia (FNF/Heel-Shin) - No Ataxia + 0 8: Test Sensation - Mild-Moderate Loss: Less Sharp/More Dull + 1 9: Test Language/Aphasia - Normal; No aphasia + 0 10: Test Dysarthria - Normal + 0 11: Test Extinction/Inattention - No abnormality + 0  NIHSS Score: 2 NIHSS Free Text : decreased sensation on left leg/arm/face to light touch; mild drift of left leg  Spoke with : Dr. Theadore    This consult was conducted in real time using interactive audio and immunologist. Patient was informed of the technology being used for this visit and agreed to proceed. Patient located in hospital and provider located at home/office setting.  Patient is being evaluated for possible acute neurologic impairment and high probability of imminent or life - threatening deterioration.I spent total of 30 minutes providing care to this patient, including time for face to face visit via telemedicine, review of medical records, imaging studies and discussion of findings with providers, the patient and / or family.   Dr Garnette Medicine     [facility] For Inpatient follow-up with [facility] physician please call [facility] at [phone]. As we are not an outpatient service for any post hospital discharge needs please contact the hospital for assistance.  If you have any questions for the [facility] physicians or need to reconsult for clinical or diagnostic changes  please contact us  via [facility] at [phone].  Non-radiologist review of imaging performed to assist with emergent clinical decision-making. Remote physician workstations do not possess the same resolution, calibration, or diagnostic capabilities as hospital-based radiology reading stations, and formal radiologist read is necessary.   Signature : [facility]

## 2024-10-05 NOTE — Progress Notes (Signed)
*  PRELIMINARY RESULTS* Echocardiogram 2D Echocardiogram has been performed.  Kathleen Tucker 10/05/2024, 12:46 PM

## 2024-10-05 NOTE — Evaluation (Signed)
 Occupational Therapy Evaluation Patient Details Name: Kathleen Tucker MRN: [id number] DOB: [birth year] Today's Date: 10/05/2024   History of Present Illness   Kathleen Tucker is a 48 y.o. female with medical history significant for hypertension, type 2 diabetes, who presents to the ER with complaints of left-sided weakness and chest pain.  Chest pain is left-sided, sharp, and radiates to the upper part of her left neck.  Also endorses numbness to the left side of her face, left-sided weakness, numbness, and headache since around 6 PM.     In the ER, twelve-lead EKG with no evidence of acute ischemia.  High-sensitivity troponin negative x 2.  Noncontrast head CT and CT angio head and neck were nonacute.  Seen by teleneuro.  Recommended MRI brain with and without contrast, MRI of cervical spine with and without contrast and MRV head.  For the patient's headache, administer migraine cocktail, IV Compazine  10 mg, IV Benadryl  25 mg, IV magnesium  2 g. (per DO)     Clinical Impressions Virtual interpretor used throughout the session today. Pt agreeable to OT and PT co-evaluation. Pt appears to be near baseline function with only noticeable deficit being L UE sensation loss in the shoulder region. Pt is able to complete ADL's and functional tranfers independently. Pt reports that her L eye feels heavy, but is not having any actual vision impairments currently. Pt is not recommended for any further acute OT services and will be discharged to care of nursing staff for remaining length of stay.                Functional Status Assessment   Patient has not had a recent decline in their functional status     Equipment Recommendations   None recommended by OT              Precautions/Restrictions   Precautions Precautions: None Restrictions Weight Bearing Restrictions Per Provider Order: No     Mobility Bed Mobility Overal bed mobility: Independent                   Transfers Overall transfer level: Independent                        Balance Overall balance assessment: Independent                                         ADL either performed or assessed with clinical judgement   ADL Overall ADL's : Independent                                             Vision Baseline Vision/History: 0 No visual deficits Ability to See in Adequate Light: 0 Adequate Patient Visual Report: Other (comment) (L eye feels heavy) (blurry vision) Vision Assessment?: No apparent visual deficits     Perception Perception: Not tested       Praxis Praxis: Not tested       Pertinent Vitals/Pain Pain Assessment Pain Assessment: No/denies pain     Extremity/Trunk Assessment Upper Extremity Assessment Upper Extremity Assessment: Overall WFL for tasks assessed;LUE deficits/detail LUE Deficits / Details: Decreased sensation in L shoudler. LUE Sensation: decreased light touch   Lower Extremity Assessment Lower Extremity Assessment: Defer to PT evaluation   Cervical /  Trunk Assessment Cervical / Trunk Assessment: Normal   Communication Communication Communication: No apparent difficulties   Cognition Arousal: Alert Behavior During Therapy: WFL for tasks assessed/performed Cognition: No apparent impairments                               Following commands: Intact       Cueing  General Comments   Cueing Techniques: Verbal cues                 Home Living Family/patient expects to be discharged to:: Private residence Living Arrangements: Children Available Help at Discharge: Family;Available 24 hours/day Type of Home: House Home Access: Level entry     Home Layout: One level     Bathroom Shower/Tub: Chief Strategy Officer: Standard Bathroom Accessibility: No   Home Equipment: None          Prior Functioning/Environment Prior Level of Function :  Independent/Modified Independent             Mobility Comments: Tourist information centre manager without AD ADLs Comments: Independent                            Co-evaluation PT/OT/SLP Co-Evaluation/Treatment: Yes Reason for Co-Treatment: To address functional/ADL transfers   OT goals addressed during session: ADL's and self-care      AM-PAC OT 6 Clicks Daily Activity     Outcome Measure Help from another person eating meals?: None Help from another person taking care of personal grooming?: None Help from another person toileting, which includes using toliet, bedpan, or urinal?: None Help from another person bathing (including washing, rinsing, drying)?: None Help from another person to put on and taking off regular upper body clothing?: None Help from another person to put on and taking off regular lower body clothing?: None 6 Click Score: 24   End of Session    Activity Tolerance: Patient tolerated treatment well Patient left: in bed;with call bell/phone within reach  OT Visit Diagnosis: Other symptoms and signs involving the nervous system (M70.101)                Time: 9192-9172 OT Time Calculation (min): 20 min Charges:  OT General Charges $OT Visit: 1 Visit OT Evaluation $OT Eval Low Complexity: 1 Low  Zhanae Proffit OT, MOT  [employer] 10/05/2024, 10:06 AM

## 2024-10-05 NOTE — ED Notes (Signed)
 Interpreter was used to complete NIH and other assessments.

## 2024-10-05 NOTE — Plan of Care (Signed)

## 2024-10-05 NOTE — ED Notes (Signed)
 TeleNeurology consult started.

## 2024-10-06 DIAGNOSIS — G629 Polyneuropathy, unspecified: Secondary | ICD-10-CM

## 2024-10-06 DIAGNOSIS — K219 Gastro-esophageal reflux disease without esophagitis: Secondary | ICD-10-CM

## 2024-10-06 DIAGNOSIS — R079 Chest pain, unspecified: Secondary | ICD-10-CM

## 2024-10-06 LAB — GLUCOSE, CAPILLARY
Glucose-Capillary: 90 mg/dL (ref 70–99)
Glucose-Capillary: 89 mg/dL (ref 70–99)

## 2024-10-06 LAB — HEMOGLOBIN A1C
Hgb A1c MFr Bld: 5.8 % — ABNORMAL HIGH (ref 4.8–5.6)
Mean Plasma Glucose: 120 mg/dL

## 2024-10-06 MED ORDER — PANTOPRAZOLE SODIUM 40 MG PO TBEC: 40.0000 mg | DELAYED_RELEASE_TABLET | Freq: Every day | ORAL | 2 refills | Status: AC

## 2024-10-06 MED ORDER — GABAPENTIN 100 MG PO CAPS
100.0000 mg | ORAL_CAPSULE | Freq: Two times a day (BID) | ORAL | Status: DC
  Administered 2024-10-06: 100 mg via ORAL
  Filled 2024-10-06: qty 1

## 2024-10-06 MED ORDER — LISINOPRIL 10 MG PO TABS
10.0000 mg | ORAL_TABLET | Freq: Every day | ORAL | Status: DC
  Administered 2024-10-06: 10 mg via ORAL
  Filled 2024-10-06: qty 1

## 2024-10-06 MED ORDER — ASPIRIN 81 MG PO TBEC
81.0000 mg | DELAYED_RELEASE_TABLET | Freq: Every day | ORAL | Status: DC
  Administered 2024-10-06: 81 mg via ORAL
  Filled 2024-10-06: qty 1

## 2024-10-06 MED ORDER — ASPIRIN 81 MG PO TBEC: 81.0000 mg | DELAYED_RELEASE_TABLET | Freq: Every day | ORAL | 12 refills | Status: AC

## 2024-10-06 MED ORDER — GABAPENTIN 100 MG PO CAPS: 100.0000 mg | ORAL_CAPSULE | Freq: Two times a day (BID) | ORAL | 2 refills | Status: AC

## 2024-10-06 NOTE — Discharge Summary (Signed)
 Physician Discharge Summary   Patient: Kathleen Tucker MRN: [id number] DOB: 04-Jul-1976  Admit date:     10/04/2024  Discharge date: 10/06/24  Discharge Physician: Eric Nunnery   PCP: Pcp, No   Recommendations at discharge:  Repeat CBC to follow hemoglobin trend/stability Repeat basic metabolic panel to follow electrolytes and renal function Reassess blood pressure and adjust antihypertensive med as needed Follow-up patient's response to the use of Neurontin  and adjust medications as required.  Discharge Diagnoses: Principal Problem:   Neurological deficit present Active Problems:   Chest pain   Gastroesophageal reflux disease   Neuropathy  Brief Hospital admission narrative: As per H&P written by Dr. Shona on 10/05/2024 Kathleen Tucker is a 48 y.o. female with medical history significant for hypertension, type 2 diabetes, who presents to the ER with complaints of left-sided weakness and chest pain.  Chest pain is left-sided, sharp, and radiates to the upper part of her left neck.  Also endorses numbness to the left side of her face, left-sided weakness, numbness, and headache since around 6 PM.   In the ER, twelve-lead EKG with no evidence of acute ischemia.  High-sensitivity troponin negative x 2.  Noncontrast head CT and CT angio head and neck were nonacute.  Seen by teleneuro.  Recommended MRI brain with and without contrast, MRI of cervical spine with and without contrast and MRV head.  For the patient's headache, administer migraine cocktail, IV Compazine  10 mg, IV Benadryl  25 mg, IV magnesium  2 g.   At the time of this visit patient was alert and oriented x 3.  Her symptoms of numbness persist.  Rule out acute stroke versus demyelinating disease, versus migraine with aura.   Admitted by [facility], hospitalist service.   ED Course: Temperature 98.  BP 143/70, pulse 84, respiration 18, O2 saturation 99% on room air.  Assessment and Plan: 1-neurological deficit present at time  of admission/TIA versus migraine with aura -Workup to rule out the presence of a stroke - 2D echo within normal limits - No signs of LVO or any significant basilar stenosis. - Patient's MRV of the head rule out any venous thrombosis. - Following recommendations by neurology service Daily baby aspirin  recommended at discharge - Following findings of cervical MRI and associated complaining of neuropathy patient has been started on Neurontin  twice a day. - Continue to follow response and if needed outpatient follow-up with neurosurgery recommended. - Patient expressed significant improvement in her symptoms and felt ready to go home at time of discharge. - Continue Refacto modifications and daily antihypertensive agents.  2-history of prediabetes - Continue modified carbohydrate diet - Currently not requiring any hypoglycemic agents. - A1c 5.8  3-hypertension - Continue lisinopril   4-GERD - Continue PPI  5-neuropathy - As mentioned above patient discharge on gabapentin  twice a day. - Follow response and adjust medication as required.  6-hypokalemia - Electrolytes have been repleted and is stable at discharge. - Repeat basic metabolic panel to follow electrolytes trend.  Consultants: Neurology service. Procedures performed: See below for x-ray report. Disposition: Home. Diet recommendation: Heart healthy/modified carbohydrate diet.  DISCHARGE MEDICATION: Allergies as of 10/06/2024   No Known Allergies      Medication List     TAKE these medications    acetaminophen  500 MG tablet Commonly known as: TYLENOL  Take 500 mg by mouth every 6 (six) hours as needed for moderate pain (pain score 4-6) or mild pain (pain score 1-3).   aspirin  EC 81 MG tablet Take 1 tablet (81 mg total)  by mouth daily. Swallow whole.   gabapentin  100 MG capsule Commonly known as: NEURONTIN  Take 1 capsule (100 mg total) by mouth 2 (two) times daily.   lisinopril  10 MG tablet Commonly known as:  ZESTRIL  Take 10 mg by mouth daily.   pantoprazole  40 MG tablet Commonly known as: PROTONIX  Take 1 tablet (40 mg total) by mouth daily.        Follow-up Information     Lola Donnice HERO, MD. Schedule an appointment as soon as possible for a visit in 10 day(s).   Specialty: Family Medicine Contact information: [address] [place] [address] [phone]                Discharge Exam: Kathleen Tucker   10/04/24 2337  Weight: 74.8 kg   General exam: Alert, awake, oriented x 3 Respiratory system: Clear to auscultation. Respiratory effort normal. Cardiovascular system:RRR. No murmurs, rubs, gallops. Gastrointestinal system: Abdomen is nondistended, soft and nontender. No organomegaly or masses felt. Normal bowel sounds heard. Central nervous system: Alert and oriented. No focal neurological deficits. Extremities: No C/C/E, +pedal pulses Skin: No rashes, lesions or ulcers Psychiatry: Judgement and insight appear normal. Mood & affect appropriate.    Condition at discharge: Stable and improved.  The results of significant diagnostics from this hospitalization (including imaging, microbiology, ancillary and laboratory) are listed below for reference.   Imaging Studies: ECHOCARDIOGRAM COMPLETE Result Date: 10/05/2024    ECHOCARDIOGRAM REPORT   Patient Name:   Kathleen Tucker Date of Exam: 10/05/2024 Medical Rec #:  [id number]           Height:       65.0 in Accession #:    [id number]          Weight:       165.0 lb Date of Birth:  Jan 25, 1976          BSA:          1.823 m Patient Age:    48 years            BP:           123/73 mmHg Patient Gender: F                   HR:           83 bpm. Exam Location:  [facility] Procedure: 2D Echo, Cardiac Doppler and Color Doppler (Both Spectral and Color            Flow Doppler were utilized during procedure). Indications:    Chest Pain R07.9  History:        Patient has no prior history of Echocardiogram  examinations.                 Risk Factors:Hypertension.  Sonographer:    Aida Pizza RCS Referring Phys: [id number] CAROLE N HALL IMPRESSIONS  1. Left ventricular ejection fraction, by estimation, is 70 to 75%. The left ventricle has hyperdynamic function. The left ventricle has no regional wall motion abnormalities. Left ventricular diastolic parameters were normal.  2. Right ventricular systolic function is normal. The right ventricular size is normal.  3. The mitral valve is normal in structure. Trivial mitral valve regurgitation.  4. The aortic valve is tricuspid. Aortic valve regurgitation is not visualized. Aortic valve sclerosis/calcification is present, without any evidence of aortic stenosis.  5. The inferior vena cava is normal in size with <50% respiratory variability, suggesting right atrial pressure of 8 mmHg. FINDINGS  Left Ventricle: Left ventricular ejection fraction, by estimation, is 70 to 75%. The left ventricle has hyperdynamic function. The left ventricle has no regional wall motion abnormalities. The left ventricular internal cavity size was normal in size. There is no left ventricular hypertrophy. Left ventricular diastolic parameters were normal. Right Ventricle: The right ventricular size is normal. Right vetricular wall thickness was not assessed. Right ventricular systolic function is normal. Left Atrium: Left atrial size was normal in size. Right Atrium: Right atrial size was normal in size. Pericardium: There is no evidence of pericardial effusion. Mitral Valve: The mitral valve is normal in structure. Trivial mitral valve regurgitation. Tricuspid Valve: The tricuspid valve is normal in structure. Tricuspid valve regurgitation is trivial. Aortic Valve: The aortic valve is tricuspid. Aortic valve regurgitation is not visualized. Aortic valve sclerosis/calcification is present, without any evidence of aortic stenosis. Pulmonic Valve: The pulmonic valve was not well visualized. Pulmonic  valve regurgitation is not visualized. Aorta: The aortic root is normal in size and structure. Venous: The inferior vena cava is normal in size with less than 50% respiratory variability, suggesting right atrial pressure of 8 mmHg. IAS/Shunts: No atrial level shunt detected by color flow Doppler.  LEFT VENTRICLE PLAX 2D LVIDd:         4.40 cm   Diastology LVIDs:         2.10 cm   LV e' medial:    9.32 cm/s LV PW:         1.00 cm   LV E/e' medial:  9.7 LV IVS:        1.10 cm   LV e' lateral:   13.60 cm/s LVOT diam:     1.90 cm   LV E/e' lateral: 6.7 LV SV:         81 LV SV Index:   44 LVOT Area:     2.84 cm  RIGHT VENTRICLE RV S prime:     13.10 cm/s TAPSE (M-mode): 1.9 cm LEFT ATRIUM             Index        RIGHT ATRIUM           Index LA diam:        2.80 cm 1.54 cm/m   RA Area:     17.60 cm LA Vol (A2C):   48.2 ml 26.44 ml/m  RA Volume:   53.90 ml  29.57 ml/m LA Vol (A4C):   34.0 ml 18.65 ml/m LA Biplane Vol: 42.0 ml 23.04 ml/m  AORTIC VALVE LVOT Vmax:   136.00 cm/s LVOT Vmean:  94.400 cm/s LVOT VTI:    0.284 m  AORTA Ao Root diam: 3.60 cm MITRAL VALVE               TRICUSPID VALVE MV Area (PHT): 3.08 cm    TR Peak grad:   18.1 mmHg MV Decel Time: 246 msec    TR Vmax:        213.00 cm/s MV E velocity: 90.70 cm/s MV A velocity: 84.60 cm/s  SHUNTS MV E/A ratio:  1.07        Systemic VTI:  0.28 m                            Systemic Diam: 1.90 cm Vina Gull MD Electronically signed by Vina Gull MD Signature Date/Time: 10/05/2024/5:55:56 PM    Final    MR Cervical Spine W and Wo Contrast Result  Date: 10/05/2024 EXAM: MRI CERVICAL SPINE WITH AND WITHOUT CONTRAST 10/05/2024 01:53:48 PM TECHNIQUE: Multiplanar multisequence MRI of the cervical spine was performed without and with the administration of intravenous contrast. 7.5 mL of gadobutrol  (GADAVIST ) 1 MMOL/ML injection was administered. COMPARISON: CTA head and neck 10/05/2024. CLINICAL HISTORY: Myelopathy, acute, cervical spine. Left sided weakness,  numbness, and headache. FINDINGS: LIMITATIONS/ARTIFACTS: The examination is mildly motion degraded. BONES AND ALIGNMENT: Cervical spine straightening. No listhesis. No fracture, suspicious marrow lesion, or significant marrow edema. No abnormal enhancement. SPINAL CORD: Normal spinal cord signal. SOFT TISSUES: No paraspinal mass. C2-C3: Negative. C3-C4: Negative. C4-C5: Minimal disc bulging without stenosis. C5-C6: Disc bulging and a right posterolateral disc protrusion result in mild spinal stenosis and right neural foraminal stenosis with slight ventral cord flattening. C6-C7: Shallow central disc protrusion without stenosis. C7-T1: Negative. IMPRESSION: 1. Disc bulging and a right-sided disc protrusion at C5-C6 resulting in mild spinal stenosis and right neural foraminal stenosis. 2. Minimal spondylosis elsewhere without stenosis. 3. Normal cervical spinal cord signal. Electronically signed by: Dasie Hamburg MD 10/05/2024 03:17 PM EST RP Workstation: [id number]   MR MRV HEAD WO CM Result Date: 10/05/2024 EXAM: MRV BRAIN 10/05/2024 01:53:48 PM TECHNIQUE: Multiplanar multisequence MRV of the head was performed without the administration of intravenous contrast. Multiplanar 2D and 3D reformatted images are provided for review. COMPARISON: CTA head and neck 10/05/2024. CLINICAL HISTORY: Dural venous sinus thrombosis suspected. Left sided weakness, numbness, and headache. FINDINGS: The superior sagittal sinus, internal cerebral veins, vein of galen, straight sinus, transverse sinuses, sigmoid sinuses, and jugular bulbs are patent without evidence of thrombus. The right transverse and sigmoid sinuses are dominant, while the left transverse sinus is hypoplastic. IMPRESSION: 1. No evidence of dural venous sinus thrombosis. Electronically signed by: Dasie Hamburg MD 10/05/2024 03:12 PM EST RP Workstation: [id number]   MR Brain W and Wo Contrast Result Date: 10/05/2024 EXAM: MRI BRAIN WITH AND WITHOUT CONTRAST 10/05/2024  01:53:48 PM TECHNIQUE: Multiplanar multisequence MRI of the head/brain was performed with and without the administration of intravenous contrast. COMPARISON: CTA head and neck 10/05/2024. CLINICAL HISTORY: Neuro deficit, acute, stroke suspected. Left sided weakness, numbness, and headache. FINDINGS: BRAIN AND VENTRICLES: No acute infarct, mass, hydrocephalus, midline shift, or extra-axial fluid collection is identified. Cerebral volume is normal. A 2 cm focus of prominent susceptibility is present involving subcortical white matter and cortex laterally in the posterior left frontal lobe suggestive of chronic hemorrhage. There is no associated edema or abnormal enhancement, and no underlying vascular abnormality was evident on CTA. Scattered small T2 hyperintensities in the predominantly subcortical cerebral white matter bilaterally are mild but slightly abnormal for age. Major intracranial vascular flow voids are preserved. ORBITS: No acute abnormality. SINUSES: Small mucous retention cyst in the left maxillary sinus. Clear mastoid air cells. BONES AND SOFT TISSUES: Normal bone marrow signal and enhancement. No acute soft tissue abnormality. IMPRESSION: 1. No acute intracranial abnormality. 2. 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 3. Mild scattered subcortical white matter T2 hyperintensities, slightly abnormal for age and nonspecific but may reflect early chronic small vessel ischemic disease, migraines, or prior infection/inflammation. Electronically signed by: Dasie Hamburg MD 10/05/2024 03:11 PM EST RP Workstation: [id number]   DG Chest Port 1 View Result Date: 10/05/2024 EXAM: 1 VIEW(S) XRAY OF THE CHEST 10/05/2024 12:43:00 AM COMPARISON: 08/10/2023 CLINICAL HISTORY: cp FINDINGS: LUNGS AND PLEURA: No focal pulmonary opacity. No pleural effusion. No pneumothorax. HEART AND MEDIASTINUM: No acute abnormality of the cardiac and mediastinal silhouettes. BONES AND SOFT  TISSUES: No acute osseous  abnormality. IMPRESSION: 1. No acute cardiopulmonary process identified. Electronically signed by: Dorethia Molt MD 10/05/2024 12:46 AM EST RP Workstation: [id number]   CT ANGIO HEAD NECK W WO CM Result Date: 10/05/2024 EXAM: CTA HEAD AND NECK WITHOUT AND WITH 10/05/2024 12:25:44 AM TECHNIQUE: CTA of the head and neck was performed with the administration of 150 mL of iohexol  (OMNIPAQUE ) 350 MG/ML injection. CT of the head was performed prior to the administration of intravenous contrast. Multiplanar 2D and/or 3D reformatted images are provided for review. Automated exposure control, iterative reconstruction, and/or weight based adjustment of the mA/kV was utilized to reduce the radiation dose to as low as reasonably achievable. Stenosis of the internal carotid arteries measured using NASCET criteria. COMPARISON: None available CLINICAL HISTORY: Neuro deficit, acute, stroke suspected. FINDINGS: CT BRAIN: Brain and ventricles: No mass, hemorrhage, or extra-axial collection. No mass effect or midline shift. No hydrocephalus. Orbits: No acute abnormality. Sinuses: No acute abnormality. CTA NECK: AORTIC ARCH AND ARCH VESSELS: No dissection or arterial injury. No significant stenosis of the brachiocephalic or subclavian arteries. CERVICAL CAROTID ARTERIES: No dissection, arterial injury, or hemodynamically significant stenosis by NASCET criteria. CERVICAL VERTEBRAL ARTERIES: No dissection, arterial injury, or significant stenosis. LUNGS AND MEDIASTINUM: Unremarkable. SOFT TISSUES: No acute abnormality. BONES: No acute abnormality. CTA HEAD: ANTERIOR CIRCULATION: No significant stenosis of the internal carotid arteries. No significant stenosis of the anterior cerebral arteries. No significant stenosis of the middle cerebral arteries. No aneurysm. POSTERIOR CIRCULATION: No significant stenosis of the posterior cerebral arteries. No significant stenosis of the basilar artery. No significant stenosis of the vertebral  arteries. No aneurysm. OTHER: No dural venous sinus thrombosis on this non-dedicated study. IMPRESSION: 1. No large vessel occlusion, hemodynamically significant stenosis, or aneurysm in the head or neck. Electronically signed by: Franky Stanford MD 10/05/2024 12:45 AM EST RP Workstation: [id number]   CT Angio Chest/Abd/Pel for Dissection W and/or Wo Contrast Result Date: 10/05/2024 EXAM: CTA CHEST, ABDOMEN AND PELVIS WITHOUT AND WITH CONTRAST 10/05/2024 12:25:44 AM TECHNIQUE: CTA of the chest was performed without and with the administration of 150 mL of intravenous iohexol  (OMNIPAQUE ) 350 MG/ML injection. CTA of the abdomen and pelvis was performed with the administration of 150 mL of intravenous iohexol  (OMNIPAQUE ) 350 MG/ML injection. Multiplanar reformatted images are provided for review. MIP images are provided for review. Automated exposure control, iterative reconstruction, and/or weight based adjustment of the mA/kV was utilized to reduce the radiation dose to as low as reasonably achievable. COMPARISON: CT 07/13/2023 and CT 07/11/2023. CLINICAL HISTORY: Acute aortic syndrome (AAS) suspected. FINDINGS: VASCULATURE: AORTA: No acute finding. No abdominal aortic aneurysm. No dissection. PULMONARY ARTERIES: No pulmonary embolism with the limits of this exam. GREAT VESSELS OF AORTIC ARCH: No acute finding. No dissection. No arterial occlusion or significant stenosis. CELIAC TRUNK: No acute finding. No occlusion or significant stenosis. SUPERIOR MESENTERIC ARTERY: No acute finding. No occlusion or significant stenosis. INFERIOR MESENTERIC ARTERY: No acute finding. No occlusion or significant stenosis. RENAL ARTERIES: No acute finding. No occlusion or significant stenosis. ILIAC ARTERIES: No acute finding. No occlusion or significant stenosis. CHEST: MEDIASTINUM: No mediastinal lymphadenopathy. The heart and pericardium demonstrate no acute abnormality. LUNGS AND PLEURA: The lungs are without acute process. No  focal consolidation or pulmonary edema. No evidence of pleural effusion or pneumothorax. THORACIC BONES AND SOFT TISSUES: No acute bone or soft tissue abnormality. ABDOMEN AND PELVIS: LIVER: The liver is unremarkable. GALLBLADDER AND BILE DUCTS: Gallbladder is unremarkable. No biliary ductal dilatation. SPLEEN:  The spleen is unremarkable. PANCREAS: The pancreas is unremarkable. ADRENAL GLANDS: Bilateral adrenal glands demonstrate no acute abnormality. KIDNEYS, URETERS AND BLADDER: No stones in the kidneys or ureters. No hydronephrosis. No perinephric or periureteral stranding. Urinary bladder is unremarkable. GI AND BOWEL: Stomach and duodenal sweep demonstrate no acute abnormality. There is no bowel obstruction. No abnormal bowel wall thickening or distension. REPRODUCTIVE: Reproductive organs are unremarkable. PERITONEUM AND RETROPERITONEUM: No ascites or free air. LYMPH NODES: No lymphadenopathy. ABDOMINAL BONES AND SOFT TISSUES: No acute abnormality of the bones. No acute soft tissue abnormality. IMPRESSION: 1. No acute aortic syndrome. Electronically signed by: Norman Gatlin MD 10/05/2024 12:32 AM EST RP Workstation: [id number]    Microbiology: Results for orders placed or performed in visit on 07/22/22  Culture, beta strep (group b only)     Status: None   Collection Time: 07/22/22  4:29 PM   Specimen: Vaginal/Rectal; Genital   VR  Result Value Ref Range Status   Strep Gp B Culture Negative Negative Final    Comment: Centers for Disease Control and Prevention (CDC) and American Congress of Obstetricians and Gynecologists (ACOG) guidelines for prevention of perinatal group B streptococcal (GBS) disease specify co-collection of a vaginal and rectal swab specimen to maximize sensitivity of GBS detection. Per the CDC and ACOG, swabbing both the lower vagina and rectum substantially increases the yield of detection compared with sampling the vagina alone. Penicillin  G, ampicillin, or cefazolin  are indicated for intrapartum prophylaxis of perinatal GBS colonization. Reflex susceptibility testing should be performed prior to use of clindamycin only on GBS isolates from penicillin -allergic women who are considered a high risk for anaphylaxis. Treatment with vancomycin without additional testing is warranted if resistance to clindamycin is noted.     Labs: CBC: Recent Labs  Lab 10/05/24 0002 10/05/24 0420  WBC 8.6 8.4  NEUTROABS 3.9  --   HGB 13.7 12.4  HCT 40.8 37.0  MCV 80.0 81.3  PLT 277 238   Basic Metabolic Panel: Recent Labs  Lab 10/05/24 0002 10/05/24 0420  NA 137 139  K 3.3* 5.0  CL 101 108  CO2 21* 20*  GLUCOSE 86 90  BUN 15 12  CREATININE 0.68 0.63  CALCIUM 8.9 8.1*  MG  --  2.1  PHOS  --  3.5   Liver Function Tests: Recent Labs  Lab 10/05/24 0002  AST 19  ALT 12  ALKPHOS 62  BILITOT 0.9  PROT 7.6  ALBUMIN 4.5   CBG: Recent Labs  Lab 10/05/24 1125 10/05/24 1608 10/05/24 1946 10/06/24 0716 10/06/24 1115  GLUCAP 87 91 109* 90 89    Discharge time spent:  35 minutes.  Signed: Eric Nunnery, MD [facility] 10/06/2024

## 2024-10-06 NOTE — Plan of Care (Signed)
  Problem: Education: Goal: Knowledge of disease or condition will improve Outcome: Progressing Goal: Knowledge of secondary prevention will improve (MUST DOCUMENT ALL) Outcome: Progressing Goal: Knowledge of patient specific risk factors will improve (DELETE if not current risk factor) Outcome: Progressing   Problem: Ischemic Stroke/TIA Tissue Perfusion: Goal: Complications of ischemic stroke/TIA will be minimized 10/06/2024 [id number] by Rebecka Caron NOVAK, RN Outcome: Progressing 10/05/2024 1937 by Rebecka Caron NOVAK, RN Outcome: Progressing   Problem: Coping: Goal: Will verbalize positive feelings about self Outcome: Progressing Goal: Will identify appropriate support needs Outcome: Progressing   Problem: Health Behavior/Discharge Planning: Goal: Ability to manage health-related needs will improve Outcome: Progressing Goal: Goals will be collaboratively established with patient/family Outcome: Progressing   Problem: Self-Care: Goal: Ability to participate in self-care as condition permits will improve Outcome: Progressing Goal: Verbalization of feelings and concerns over difficulty with self-care will improve Outcome: Progressing Goal: Ability to communicate needs accurately will improve Outcome: Progressing   Problem: Nutrition: Goal: Risk of aspiration will decrease Outcome: Progressing Goal: Dietary intake will improve Outcome: Progressing   Problem: Education: Goal: Knowledge of General Education information will improve Description: Including pain rating scale, medication(s)/side effects and non-pharmacologic comfort measures Outcome: Progressing   Problem: Health Behavior/Discharge Planning: Goal: Ability to manage health-related needs will improve Outcome: Progressing   Problem: Clinical Measurements: Goal: Ability to maintain clinical measurements within normal limits will improve Outcome: Progressing Goal: Will remain free from infection Outcome: Progressing Goal:  Diagnostic test results will improve Outcome: Progressing Goal: Respiratory complications will improve Outcome: Progressing Goal: Cardiovascular complication will be avoided Outcome: Progressing   Problem: Activity: Goal: Risk for activity intolerance will decrease Outcome: Progressing   Problem: Nutrition: Goal: Adequate nutrition will be maintained Outcome: Progressing   Problem: Coping: Goal: Level of anxiety will decrease Outcome: Progressing   Problem: Elimination: Goal: Will not experience complications related to bowel motility Outcome: Progressing Goal: Will not experience complications related to urinary retention Outcome: Progressing   Problem: Pain Managment: Goal: General experience of comfort will improve and/or be controlled Outcome: Progressing   Problem: Safety: Goal: Ability to remain free from injury will improve Outcome: Progressing   Problem: Skin Integrity: Goal: Risk for impaired skin integrity will decrease Outcome: Progressing   Problem: Education: Goal: Ability to describe self-care measures that may prevent or decrease complications (Diabetes Survival Skills Education) will improve Outcome: Progressing Goal: Individualized Educational Video(s) Outcome: Progressing   Problem: Coping: Goal: Ability to adjust to condition or change in health will improve Outcome: Progressing   Problem: Fluid Volume: Goal: Ability to maintain a balanced intake and output will improve Outcome: Progressing   Problem: Health Behavior/Discharge Planning: Goal: Ability to identify and utilize available resources and services will improve Outcome: Progressing Goal: Ability to manage health-related needs will improve Outcome: Progressing   Problem: Metabolic: Goal: Ability to maintain appropriate glucose levels will improve Outcome: Progressing   Problem: Nutritional: Goal: Maintenance of adequate nutrition will improve Outcome: Progressing Goal: Progress  toward achieving an optimal weight will improve Outcome: Progressing   Problem: Skin Integrity: Goal: Risk for impaired skin integrity will decrease Outcome: Progressing   Problem: Tissue Perfusion: Goal: Adequacy of tissue perfusion will improve Outcome: Progressing

## 2024-10-06 NOTE — Progress Notes (Signed)
 No deficits shown on assessments.   Spanish interpreter utilized for discharge instructions.  Scripts sent to [facility] in [place] and to establish a primary care doctor.  Transported by WC to main entrance and son driving home.
